# Patient Record
Sex: Male | Born: 1945 | Race: White | Hispanic: No | Marital: Married | Smoking: Former smoker
Health system: Southern US, Community
[De-identification: ages and names within clinical notes are randomized; demographics above are authoritative.]

## PROBLEM LIST (undated history)

## (undated) DIAGNOSIS — E785 Hyperlipidemia, unspecified: Secondary | ICD-10-CM

## (undated) DIAGNOSIS — K219 Gastro-esophageal reflux disease without esophagitis: Secondary | ICD-10-CM

## (undated) DIAGNOSIS — Z9289 Personal history of other medical treatment: Secondary | ICD-10-CM

## (undated) DIAGNOSIS — Z87442 Personal history of urinary calculi: Secondary | ICD-10-CM

## (undated) DIAGNOSIS — I714 Abdominal aortic aneurysm, without rupture: Secondary | ICD-10-CM

## (undated) DIAGNOSIS — M542 Cervicalgia: Secondary | ICD-10-CM

## (undated) DIAGNOSIS — R011 Cardiac murmur, unspecified: Secondary | ICD-10-CM

## (undated) DIAGNOSIS — K222 Esophageal obstruction: Secondary | ICD-10-CM

## (undated) DIAGNOSIS — E039 Hypothyroidism, unspecified: Secondary | ICD-10-CM

## (undated) DIAGNOSIS — E119 Type 2 diabetes mellitus without complications: Secondary | ICD-10-CM

## (undated) DIAGNOSIS — I219 Acute myocardial infarction, unspecified: Secondary | ICD-10-CM

## (undated) DIAGNOSIS — I2510 Atherosclerotic heart disease of native coronary artery without angina pectoris: Secondary | ICD-10-CM

## (undated) DIAGNOSIS — Z86010 Personal history of colonic polyps: Secondary | ICD-10-CM

## (undated) DIAGNOSIS — C679 Malignant neoplasm of bladder, unspecified: Secondary | ICD-10-CM

## (undated) DIAGNOSIS — Z860101 Personal history of adenomatous and serrated colon polyps: Secondary | ICD-10-CM

## (undated) DIAGNOSIS — I10 Essential (primary) hypertension: Secondary | ICD-10-CM

## (undated) HISTORY — PX: COLONOSCOPY: SHX174

## (undated) HISTORY — DX: Abdominal aortic aneurysm, without rupture: I71.4

## (undated) HISTORY — DX: Cardiac murmur, unspecified: R01.1

## (undated) HISTORY — DX: Gastro-esophageal reflux disease without esophagitis: K21.9

## (undated) HISTORY — DX: Cervicalgia: M54.2

## (undated) HISTORY — DX: Hypothyroidism, unspecified: E03.9

## (undated) HISTORY — DX: Personal history of colonic polyps: Z86.010

## (undated) HISTORY — DX: Hyperlipidemia, unspecified: E78.5

## (undated) HISTORY — PX: TONSILLECTOMY AND ADENOIDECTOMY: SUR1326

## (undated) HISTORY — DX: Esophageal obstruction: K22.2

## (undated) HISTORY — DX: Personal history of adenomatous and serrated colon polyps: Z86.0101

## (undated) HISTORY — DX: Atherosclerotic heart disease of native coronary artery without angina pectoris: I25.10

## (undated) HISTORY — DX: Malignant neoplasm of bladder, unspecified: C67.9

## (undated) HISTORY — DX: Essential (primary) hypertension: I10

## (undated) HISTORY — DX: Acute myocardial infarction, unspecified: I21.9

## (undated) HISTORY — DX: Personal history of other medical treatment: Z92.89

---

## 2001-11-13 ENCOUNTER — Emergency Department (HOSPITAL_COMMUNITY): Admission: EM | Admit: 2001-11-13 | Discharge: 2001-11-14 | Payer: Self-pay

## 2004-12-01 ENCOUNTER — Ambulatory Visit: Payer: Self-pay | Admitting: Internal Medicine

## 2004-12-07 ENCOUNTER — Ambulatory Visit: Payer: Self-pay | Admitting: Internal Medicine

## 2004-12-31 DIAGNOSIS — C679 Malignant neoplasm of bladder, unspecified: Secondary | ICD-10-CM

## 2004-12-31 HISTORY — DX: Malignant neoplasm of bladder, unspecified: C67.9

## 2005-01-07 ENCOUNTER — Emergency Department (HOSPITAL_COMMUNITY): Admission: EM | Admit: 2005-01-07 | Discharge: 2005-01-07 | Payer: Self-pay | Admitting: Family Medicine

## 2005-02-02 ENCOUNTER — Ambulatory Visit: Payer: Self-pay | Admitting: Internal Medicine

## 2005-02-27 ENCOUNTER — Ambulatory Visit: Payer: Self-pay | Admitting: Internal Medicine

## 2005-12-31 HISTORY — PX: CORONARY ANGIOPLASTY WITH STENT PLACEMENT: SHX49

## 2006-05-22 ENCOUNTER — Ambulatory Visit: Payer: Self-pay | Admitting: Internal Medicine

## 2006-05-30 ENCOUNTER — Ambulatory Visit: Payer: Self-pay | Admitting: Internal Medicine

## 2006-06-11 ENCOUNTER — Ambulatory Visit: Payer: Self-pay | Admitting: Internal Medicine

## 2006-06-12 ENCOUNTER — Ambulatory Visit: Payer: Self-pay | Admitting: Cardiovascular Disease

## 2006-07-09 ENCOUNTER — Ambulatory Visit (HOSPITAL_BASED_OUTPATIENT_CLINIC_OR_DEPARTMENT_OTHER): Admission: RE | Admit: 2006-07-09 | Discharge: 2006-07-09 | Payer: Self-pay | Admitting: Urology

## 2006-07-09 ENCOUNTER — Encounter (INDEPENDENT_AMBULATORY_CARE_PROVIDER_SITE_OTHER): Payer: Self-pay | Admitting: Specialist

## 2006-07-09 HISTORY — PX: SKIN TAG REMOVAL: SHX780

## 2006-07-09 HISTORY — PX: TRANSURETHRAL RESECTION OF BLADDER: SUR1395

## 2006-12-11 ENCOUNTER — Inpatient Hospital Stay (HOSPITAL_COMMUNITY): Admission: EM | Admit: 2006-12-11 | Discharge: 2006-12-12 | Payer: Self-pay | Admitting: Emergency Medicine

## 2006-12-11 ENCOUNTER — Ambulatory Visit: Payer: Self-pay | Admitting: Cardiology

## 2006-12-12 ENCOUNTER — Ambulatory Visit: Payer: Self-pay | Admitting: Internal Medicine

## 2007-01-08 ENCOUNTER — Ambulatory Visit: Payer: Self-pay | Admitting: Cardiology

## 2007-02-18 ENCOUNTER — Ambulatory Visit: Payer: Self-pay | Admitting: Cardiology

## 2007-03-03 ENCOUNTER — Ambulatory Visit: Payer: Self-pay | Admitting: Cardiology

## 2007-04-17 ENCOUNTER — Ambulatory Visit: Payer: Self-pay | Admitting: Cardiology

## 2007-06-05 ENCOUNTER — Ambulatory Visit: Payer: Self-pay | Admitting: Cardiology

## 2007-08-01 ENCOUNTER — Encounter: Payer: Self-pay | Admitting: Internal Medicine

## 2007-08-01 DIAGNOSIS — K219 Gastro-esophageal reflux disease without esophagitis: Secondary | ICD-10-CM | POA: Insufficient documentation

## 2007-08-01 DIAGNOSIS — E039 Hypothyroidism, unspecified: Secondary | ICD-10-CM | POA: Insufficient documentation

## 2007-09-10 ENCOUNTER — Ambulatory Visit: Payer: Self-pay | Admitting: Cardiology

## 2007-10-30 ENCOUNTER — Ambulatory Visit: Payer: Self-pay | Admitting: Cardiology

## 2007-12-09 ENCOUNTER — Encounter: Payer: Self-pay | Admitting: Internal Medicine

## 2007-12-10 ENCOUNTER — Ambulatory Visit: Payer: Self-pay

## 2007-12-10 ENCOUNTER — Encounter: Payer: Self-pay | Admitting: Internal Medicine

## 2007-12-10 LAB — CONVERTED CEMR LAB
ALT: 41 units/L (ref 0–53)
AST: 31 units/L (ref 0–37)
Albumin: 4.2 g/dL (ref 3.5–5.2)
Alkaline Phosphatase: 91 units/L (ref 39–117)
BUN: 18 mg/dL (ref 6–23)
Bilirubin, Direct: 0.3 mg/dL (ref 0.0–0.3)
CO2: 27 meq/L (ref 19–32)
Calcium: 8.7 mg/dL (ref 8.4–10.5)
Chloride: 104 meq/L (ref 96–112)
Cholesterol: 91 mg/dL (ref 0–200)
Creatinine, Ser: 1.1 mg/dL (ref 0.4–1.5)
GFR calc Af Amer: 88 mL/min
GFR calc non Af Amer: 73 mL/min
Glucose, Bld: 103 mg/dL — ABNORMAL HIGH (ref 70–99)
HDL: 21.4 mg/dL — ABNORMAL LOW (ref >39.0)
LDL Cholesterol: 51 mg/dL (ref 0–99)
Potassium: 4.1 meq/L (ref 3.5–5.1)
Sodium: 139 meq/L (ref 135–145)
TSH: 1.27 microintl units/mL (ref 0.35–5.50)
Total Bilirubin: 2.6 mg/dL — ABNORMAL HIGH (ref 0.3–1.2)
Total CHOL/HDL Ratio: 4.3
Total Protein: 6.6 g/dL (ref 6.0–8.3)
Triglycerides: 93 mg/dL (ref 0–149)
VLDL: 19 mg/dL (ref 0–40)

## 2007-12-19 ENCOUNTER — Ambulatory Visit: Payer: Self-pay | Admitting: Internal Medicine

## 2007-12-19 DIAGNOSIS — M542 Cervicalgia: Secondary | ICD-10-CM | POA: Insufficient documentation

## 2008-03-03 ENCOUNTER — Ambulatory Visit: Payer: Self-pay | Admitting: Cardiology

## 2008-03-03 LAB — CONVERTED CEMR LAB
ALT: 39 units/L (ref 0–53)
AST: 28 units/L (ref 0–37)
Albumin: 4.5 g/dL (ref 3.5–5.2)
Alkaline Phosphatase: 79 units/L (ref 39–117)
Bilirubin, Direct: 0.3 mg/dL (ref 0.0–0.3)
Cholesterol: 123 mg/dL (ref 0–200)
HDL: 27.6 mg/dL — ABNORMAL LOW (ref >39.0)
LDL Cholesterol: 65 mg/dL (ref 0–99)
Total Bilirubin: 2.6 mg/dL — ABNORMAL HIGH (ref 0.3–1.2)
Total CHOL/HDL Ratio: 4.5
Total Protein: 7.1 g/dL (ref 6.0–8.3)
Triglycerides: 150 mg/dL — ABNORMAL HIGH (ref 0–149)
VLDL: 30 mg/dL (ref 0–40)

## 2008-03-31 ENCOUNTER — Ambulatory Visit: Payer: Self-pay | Admitting: Cardiology

## 2008-07-27 ENCOUNTER — Telehealth: Payer: Self-pay | Admitting: Internal Medicine

## 2009-04-07 DIAGNOSIS — E785 Hyperlipidemia, unspecified: Secondary | ICD-10-CM | POA: Insufficient documentation

## 2009-04-12 ENCOUNTER — Encounter: Payer: Self-pay | Admitting: Cardiology

## 2009-04-12 ENCOUNTER — Ambulatory Visit: Payer: Self-pay | Admitting: Cardiology

## 2009-08-03 ENCOUNTER — Encounter: Payer: Self-pay | Admitting: Cardiology

## 2009-09-12 ENCOUNTER — Encounter: Payer: Self-pay | Admitting: Cardiology

## 2009-09-21 ENCOUNTER — Encounter (INDEPENDENT_AMBULATORY_CARE_PROVIDER_SITE_OTHER): Payer: Self-pay | Admitting: *Deleted

## 2009-12-21 DIAGNOSIS — I25110 Atherosclerotic heart disease of native coronary artery with unstable angina pectoris: Secondary | ICD-10-CM | POA: Insufficient documentation

## 2009-12-21 DIAGNOSIS — I2510 Atherosclerotic heart disease of native coronary artery without angina pectoris: Secondary | ICD-10-CM | POA: Insufficient documentation

## 2009-12-28 ENCOUNTER — Ambulatory Visit: Payer: Self-pay | Admitting: Cardiology

## 2010-01-04 ENCOUNTER — Encounter: Payer: Self-pay | Admitting: Cardiology

## 2010-02-22 ENCOUNTER — Encounter (INDEPENDENT_AMBULATORY_CARE_PROVIDER_SITE_OTHER): Payer: Self-pay | Admitting: *Deleted

## 2010-04-10 ENCOUNTER — Encounter: Payer: Self-pay | Admitting: Cardiology

## 2010-04-20 ENCOUNTER — Ambulatory Visit (HOSPITAL_COMMUNITY): Admission: RE | Admit: 2010-04-20 | Discharge: 2010-04-20 | Payer: Self-pay | Admitting: Urology

## 2010-05-03 ENCOUNTER — Encounter: Payer: Self-pay | Admitting: Cardiology

## 2010-08-07 ENCOUNTER — Encounter: Payer: Self-pay | Admitting: Cardiology

## 2010-12-21 ENCOUNTER — Ambulatory Visit: Payer: Self-pay | Admitting: Cardiology

## 2010-12-21 ENCOUNTER — Encounter: Payer: Self-pay | Admitting: Cardiology

## 2010-12-21 DIAGNOSIS — I10 Essential (primary) hypertension: Secondary | ICD-10-CM | POA: Insufficient documentation

## 2010-12-21 DIAGNOSIS — R011 Cardiac murmur, unspecified: Secondary | ICD-10-CM | POA: Insufficient documentation

## 2011-01-30 NOTE — Letter (Signed)
Summary: [facility] Office Visit Note  [facility] Office Visit Note   Imported By: Roderic Ovens 09/05/2010 11:30:51  _____________________________________________________________________  External Attachment:    Type:   Image     Comment:   External Document

## 2011-01-30 NOTE — Letter (Signed)
Summary: Colonoscopy Date Change Letter  [facility]  [address] [place], [place] [address]   Phone: [phone]  Fax: [phone]      February 22, 2010 MRN: [id number]   Frederick Miles [address] [place], [place]  [address]   Dear Frederick Miles,   Previously you were recommended to have a repeat colonoscopy around this time. Your chart was recently reviewed by Dr. Hedwig Morton. Juanda Chance of [facility]. Follow up colonoscopy is now recommended in March 2013. This revised recommendation is based on current, nationally recognized guidelines for colorectal cancer screening and polyp surveillance. These guidelines are endorsed by the American Cancer Society, [employer] on Colorectal Cancer as well as numerous other major medical organizations.  Please understand that our recommendation assumes that you do not have any new symptoms such as bleeding, a change in bowel habits, anemia, or significant abdominal discomfort. If you do have any concerning GI symptoms or want to discuss the guideline recommendations, please call to arrange an office visit at your earliest convenience. Otherwise we will keep you in our reminder system and contact you 1-2 months prior to the date listed above to schedule your next colonoscopy.  Thank you,  Hedwig Morton. Juanda Chance, M.D  [facility] [phone]

## 2011-01-30 NOTE — Assessment & Plan Note (Signed)
Summary: BACK PAIN/MHF   Vital Signs:  Patient Profile:   65 Years Old Male Weight:      198 pounds Temp:     98.4 degrees F oral BP sitting:   132 / 88  (left arm)  Vitals Entered By: Raechel Ache, RN (December 19, 2007 8:05 AM)                 Chief Complaint:  C/o neck pain since working out at gym last Sunday. Advil helps..  History of Present Illness: 65 year old gentleman, who is very active at the gym. His has some posterior neck pain,that is aggravated by flexion, and certain movements.  The pain does seem to be improving.  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  range-of-motion the neck was full.  He did have some mild posterior neck pain with extreme flexion.  There is no real tenderness over the posterior neck region    Impression & Recommendations:  Problem # 1:  NECK PAIN (ICD-723.1)  His updated medication list for this problem includes:    Bayer Aspirin 325 Mg Tabs (Aspirin) .Marland Kitchen... 1 once daily  the neck pain is muscle ligamentous;  was given samples of Arthrotec to take b.i.d.if unimproved will recontact the office  Complete Medication List: 1)  Synthroid 125 Mcg Tabs (Levothyroxine sodium) .Marland Kitchen.. 1 once daily 2)  Prilosec 20 Mg Cpdr (Omeprazole) .Marland Kitchen.. 1 once daily 3)  Plavix 75 Mg Tabs (Clopidogrel bisulfate) .Marland Kitchen.. 1 once daily 4)  Bayer Aspirin 325 Mg Tabs (Aspirin) .Marland Kitchen.. 1 once daily 5)  Lipitor 80 Mg Tabs (Atorvastatin calcium) .Marland Kitchen.. 1 once daily   Patient Instructions: 1)  Please schedule a follow-up appointment as needed.    ]

## 2011-01-30 NOTE — Letter (Signed)
Summary: [facility] Note  [facility] Note   Imported By: Roderic Ovens 08/23/2009 11:49:24  _____________________________________________________________________  External Attachment:    Type:   Image     Comment:   External Document

## 2011-01-30 NOTE — Letter (Signed)
Summary: [facility]   Imported By: Kassie Mends 12/18/2007 13:38:49  _____________________________________________________________________  External Attachment:    Type:   Image     Comment:   [facility]

## 2011-01-30 NOTE — Letter (Signed)
Summary: Appointment - Reminder 2  [facility]     [place], [place]    Phone:   Fax:      September 21, 2009 MRN: [id number]   Frederick Miles [address] [place], [place]  [address]   Dear [name],  Our records indicate that it is time to schedule a follow-up appointment.  Dr. Daleen Squibb recommended that you follow up with [place] in Dec  2010. It is very important that we reach you to schedule this appointment. We look forward to participating in your health care needs. Please contact us at the number listed above at your earliest convenience to schedule your appointment.  If you are unable to make an appointment at this time, give [place] a call so we can update our records.     Sincerely,    Lorne Skeens  [facility] Scheduling Team

## 2011-01-30 NOTE — Letter (Signed)
Summary: [facility] Office Note  [facility] Office Note   Imported By: Roderic Ovens 04/26/2010 16:24:56  _____________________________________________________________________  External Attachment:    Type:   Image     Comment:   External Document

## 2011-01-30 NOTE — Assessment & Plan Note (Signed)
Summary: CAD/ANAS      Allergies Added: NKDA  Visit Type:  rov Primary Provider:  Creola Corn  CC:  no cardiac complaints today.  History of Present Illness: Mr Makarewicz returns for evaluation management of his coronary disease. He is extremely active playing golf and jogging has had no symptoms that he presented with initially several years ago. He has a drug-eluting stent to the LAD with normal left ventricular function. He presented with progressive angina initially.  He denies palpitations, orthopnea, PND or peripheral edema. He has had no dyspnea on exertion.  Current Medications (verified): 1)  Synthroid 125 Mcg Tabs (Levothyroxine Sodium) .Marland Kitchen.. 1 Once Daily Needs Office Visit Before More Refills 2)  Prilosec 20 Mg  Cpdr (Omeprazole) .Marland Kitchen.. 1 Once Daily 3)  Plavix 75 Mg  Tabs (Clopidogrel Bisulfate) .Marland Kitchen.. 1 Once Daily--Needs Office Visit For Additional Refills 4)  Bayer Aspirin 325 Mg  Tabs (Aspirin) .Marland Kitchen.. 1 Once Daily 5)  Lipitor 40 Mg Tabs (Atorvastatin Calcium) .Marland Kitchen.. 1 Tab Once Daily  Allergies (verified): No Known Drug Allergies  Past History:  Past Medical History: Last updated: 12/21/2009 CAD, NATIVE VESSEL (ICD-414.01) HYPERLIPIDEMIA-MIXED (ICD-272.4) NECK PAIN (ICD-723.1) HYPOTHYROIDISM (ICD-244.9) GERD (ICD-530.81)    Past Surgical History: Last updated: 04/07/2009 T&A Colonoscopy-02/27/2005 1.  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  Excision of 1.5 cm skin tag right anterior thigh...07/09/06  Family History: Last updated: 04/07/2009 Notable for father who required open heart surgery at age 55, grandfather who died of an MI at age 3, a half brother who had an MI at age 80.  Social History: Last updated: 04/07/2009 Former Smoker Married Alcohol use-yes Full Time  Risk Factors: Smoking Status: quit (08/01/2007)  Review of Systems       negative other than history of present illness  Vital Signs:  Patient profile:   65 year  old male Height:      71 inches Weight:      203 pounds BMI:     28.42 Pulse rate:   80 / minute Pulse rhythm:   regular BP sitting:   150 / 90  (left arm) Cuff size:   large  Vitals Entered By: Danielle Rankin, CMA (December 28, 2009 2:33 PM)  Physical Exam  General:  Well developed, well nourished, in no acute distress. Head:  normocephalic and atraumatic Eyes:  PERRLA/EOM intact; conjunctiva and lids normal. Mouth:  Teeth, gums and palate normal. Oral mucosa normal. Neck:  Neck supple, no JVD. No masses, thyromegaly or abnormal cervical nodes. Lungs:  Clear bilaterally to auscultation and percussion. Heart:  Non-displaced PMI, chest non-tender; regular rate and rhythm, S1, S2 without murmurs, rubs or gallops. Carotid upstroke normal, no bruit. Normal abdominal aortic size, no bruits. Femorals normal pulses, no bruits. Pedals normal pulses. No edema, no varicosities. Msk:  Back normal, normal gait. Muscle strength and tone normal. Pulses:  pulses normal in all 4 extremities Extremities:  No clubbing or cyanosis. Neurologic:  Alert and oriented x 3. Skin:  Intact without lesions or rashes. Psych:  Normal affect.   Impression & Recommendations:  Problem # 1:  CAD, NATIVE VESSEL (ICD-414.01) Assessment Unchanged He is remarkably stable and very active. Reviewing his history Junious Dresser a classic symptoms for several days prior to coming into the hospital. This consists of extreme fatigue, shortness of breath, and then substernal chest pressure. We reviewed these again at length and how to respond. At this point I think an objective assessment of his coronaries with a stress  test would be a very little value. However and come back in a year. His updated medication list for this problem includes:    Plavix 75 Mg Tabs (Clopidogrel bisulfate) .Marland Kitchen... 1 once daily--needs office visit for additional refills    Bayer Aspirin 325 Mg Tabs (Aspirin) .Marland Kitchen... 1 once daily  Problem # 2:   HYPERLIPIDEMIA-MIXED (ICD-272.4) Assessment: Unchanged his updated medication list for this problem includes:    Lipitor 40 Mg Tabs (Atorvastatin calcium) .Marland Kitchen... 1 tab once daily  Patient Instructions: 1)  Your physician recommends that you schedule a follow-up appointment in: 12 months. The office will mail a reminder 1 to 2 months prior appointment date.

## 2011-01-30 NOTE — Letter (Signed)
Summary: [facility] Office Note  [facility] Office Note   Imported By: Roderic Ovens 01/20/2010 14:41:10  _____________________________________________________________________  External Attachment:    Type:   Image     Comment:   External Document

## 2011-01-30 NOTE — Letter (Signed)
Summary: [facility] Office Note  [facility] Office Note   Imported By: Roderic Ovens 05/25/2010 15:26:01  _____________________________________________________________________  External Attachment:    Type:   Image     Comment:   External Document

## 2011-01-30 NOTE — Assessment & Plan Note (Signed)
Summary: [facility]  Medications Added LIPITOR 40 MG TABS (ATORVASTATIN CALCIUM) 1 tab once daily       21  22  23  24  25  Allergies Added: NKDA  Visit Type:  Follow-up Primary Provider:  Birdie Sons MD  CC:  no complaints.  History of Present Illness: [name] comes in today for followup of his coronary disease. He is remarkably well working out 5 days a week and playing lots of golf. He is having no symptoms of angina or ischemia. He denies any syncope, dyspnea on exertion, palpitations.  He is very compliant with medications. His blood work is being followed by his primary care physician.  Current Medications (verified): 1)  Synthroid 125 Mcg Tabs (Levothyroxine Sodium) .Marland Kitchen.. 1 Once Daily Needs Office Visit Before More Refills 2)  Prilosec 20 Mg  Cpdr (Omeprazole) .Marland Kitchen.. 1 Once Daily 3)  Plavix 75 Mg  Tabs (Clopidogrel Bisulfate) .Marland Kitchen.. 1 Once Daily 4)  Bayer Aspirin 325 Mg  Tabs (Aspirin) .Marland Kitchen.. 1 Once Daily 5)  Lipitor 40 Mg Tabs (Atorvastatin Calcium) .Marland Kitchen.. 1 Tab Once Daily  Allergies (verified): No Known Drug Allergies  Past History:  Past Medical History:    CAD, UNSPECIFIED SITE (ICD-414.00)    HYPERLIPIDEMIA-MIXED (ICD-272.4)    NECK PAIN (ICD-723.1)    HYPOTHYROIDISM (ICD-244.9)    GERD (ICD-530.81)    Kidney Stone     (04/07/2009)  Family History:    Notable for father who required open heart surgery at    age 82, grandfather who died of an MI at age 36, a half brother who had    an MI at age 68.     (04/07/2009)  Review of Systems  The patient denies weight loss, weight gain, vision loss, decreased hearing, chest pain, syncope, dyspnea on exertion, peripheral edema, muscle weakness, and difficulty walking.    Vital Signs:  Patient profile:   65 year old male Height:      71 inches Weight:      197 pounds BMI:     27.58 Pulse rate:   72 / minute Pulse rhythm:   regular BP sitting:   148 / 92  (left arm)  Vitals Entered By: Danielle Rankin,  CMA (April 12, 2009 2:18 PM)  Physical Exam  General:  Well developed, well nourished, in no acute distress. Head:  normocephalic and atraumatic Eyes:  PERRLA/EOM intact; conjunctiva and lids normal. Mouth:  Teeth, gums and palate normal. Oral mucosa normal. Neck:  Neck supple, no JVD. No masses, thyromegaly or abnormal cervical nodes. Chest Caryn Gienger:  no deformities or breast masses noted Lungs:  Clear bilaterally to auscultation and percussion. Heart:  Non-displaced PMI, chest non-tender; regular rate and rhythm, S1, S2 without murmurs, rubs or gallops. Carotid upstroke normal, no bruit. Normal abdominal aortic size, no bruits. Femorals normal pulses, no bruits. Pedals normal pulses. No edema, no varicosities. Abdomen:  Bowel sounds positive; abdomen soft and non-tender without masses, organomegaly, or hernias noted. No hepatosplenomegaly. Msk:  Back normal, normal gait. Muscle strength and tone normal. Pulses:  pulses normal in all 4 extremities Extremities:  No clubbing or cyanosis. Neurologic:  Alert and oriented x 3. Skin:  Intact without lesions or rashes.   Impression & Recommendations:  Problem # 1:  CAD, UNSPECIFIED SITE (ICD-414.00) Assessment Unchanged  His updated medication list for this problem includes:    Plavix 75 Mg Tabs (Clopidogrel bisulfate) .Marland Kitchen... 1 once daily    Bayer Aspirin 325 Mg Tabs (Aspirin) .Marland KitchenMarland KitchenMarland KitchenMarland Kitchen 1  once daily The patient asked about the interaction between Plavix and Prilosec. He's been on both for several years now with no problems. I told today that was inconsistent and at most cardiologists would not change it. He will stay on Plavix as long as he can afford it or until he has a complication. We've had discussion before.  Patient Instructions: 1)  Your physician recommends that you schedule a follow-up appointment in:6 months 2)  Your physician has requested that you have an exercise stress myoview.  For further information please visit  [email or web address]  Please follow instruction sheet, as given.we will schedule this in the fall and seen back in December.

## 2011-01-30 NOTE — Progress Notes (Signed)
Summary: lipitor refill  Phone Note From Pharmacy   [facility]  [address] [phone]* Call For: todd  Details for Reason: refill of lipitor Summary of Call: pharm is calling for refill of lipitor Initial call taken by: Kern Reap CMA,  July 27, 2008 9:05 AM  Follow-up for Phone Call        Pharmacist called Follow-up by: Kern Reap CMA,  July 27, 2008 9:06 AM      Prescriptions: LIPITOR 80 MG  TABS (ATORVASTATIN CALCIUM) 1 once daily  #30 x 0   Entered by:   Kern Reap CMA   Authorized by:   Birdie Sons MD   Signed by:   Kern Reap CMA on 07/27/2008   Method used:   Electronically sent to ...       [facility]  [address] [phone]*       [address]       [place], [place]  [address]       Ph: [phone] or [phone]       Fax: [phone]   RxID:   [id number]

## 2011-01-30 NOTE — Letter (Signed)
Summary: [facility]  [facility]   Imported By: Maryln Gottron 04/17/2010 15:57:59  _____________________________________________________________________  External Attachment:    Type:   Image     Comment:   External Document

## 2011-02-01 NOTE — Assessment & Plan Note (Signed)
Summary: 1 YR/D.MILLER  Medications Added LISINOPRIL 20 MG TABS (LISINOPRIL) 1 tab once daily      Allergies Added: NKDA  Visit Type:  1 yr f/u Primary Provider:  Creola Corn  CC:  pt denies any cardiac complaints today.  History of Present Illness: Frederick Miles comes in today for followup of his coronary disease. He's had no symptoms of angina or ischemia. He remains reactive playing golf and blue grass music.  He is followed now by Dr. Timothy Lasso who checks on his blood work. Meds reviewed and he is very compliant.  Denies any presyncope or sick with exertion. He said no dyspnea on exertion.  Clinical Reports Reviewed:  Cardiac Cath:  12/11/2006: Cardiac Cath Findings:   FINDINGS:  1. LV:  133/0/17.  EF 65% without regional [name] motion abnormality.  2. No aortic stenosis or mitral regurgitation.  3. Left main:  Angiographically normal.  4. LAD:  95% stenosis of the proximal vessel which was stented to no      residual.  There is a single moderate-sized diagonal.  This vessel      has a 50% stenosis in its midsection.  5. Circumflex:  Large vessel giving rise to a single branching obtuse      marginal.  It has only minor luminal irregularities.  6. RCA:  Large, dominant vessel.  It is angiographically normal.   IMPRESSION/PLAN:  Single vessel coronary disease with successful  revascularization of the proximal left anterior descending artery using  a drug-eluting stent.  Since a drug-eluting stent was used, he should be  maintained on both aspirin and Plavix indefinitely.   Salvadore Farber, MD  Electronically Signed   WED/MEDQ  D:  12/11/2006  T:  12/11/2006  Job:  [id number]   cc:   Valetta Mole. Swords, MD   Current Medications (verified): 1)  Synthroid 125 Mcg Tabs (Levothyroxine Sodium) .Marland Kitchen.. 1 Once Daily Needs Office Visit Before More Refills 2)  Prilosec 20 Mg  Cpdr (Omeprazole) .Marland Kitchen.. 1 Once Daily--Needs Office Visit For Additional Refills 3)  Plavix 75 Mg  Tabs  (Clopidogrel Bisulfate) .Marland Kitchen.. 1 Once Daily--Needs Office Visit For Additional Refills 4)  Bayer Aspirin 325 Mg  Tabs (Aspirin) .Marland Kitchen.. 1 Once Daily 5)  Lipitor 40 Mg Tabs (Atorvastatin Calcium) .Marland Kitchen.. 1 Tab Once Daily 6)  Lisinopril 20 Mg Tabs (Lisinopril) .Marland Kitchen.. 1 Tab Once Daily  Allergies (verified): No Known Drug Allergies  Past History:  Past Medical History: Last updated: 12/21/2009 CAD, NATIVE VESSEL (ICD-414.01) HYPERLIPIDEMIA-MIXED (ICD-272.4) NECK PAIN (ICD-723.1) HYPOTHYROIDISM (ICD-244.9) GERD (ICD-530.81)    Past Surgical History: Last updated: 04/07/2009 T&A Colonoscopy-02/27/2005 1.  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  Excision of 1.5 cm skin tag right anterior thigh...07/09/06  Family History: Last updated: 04/07/2009 Notable for father who required open heart surgery at age 10, grandfather who died of an MI at age 76, a half brother who had an MI at age 62.  Social History: Last updated: 04/07/2009 Former Smoker Married Alcohol use-yes Full Time  Risk Factors: Smoking Status: quit (08/01/2007)  Review of Systems       negative other than history of present illness  Vital Signs:  Patient profile:   65 year old male Height:      71 inches Weight:      196.75 pounds BMI:     27.54 Pulse rate:   70 / minute Pulse rhythm:   regular BP sitting:   134 / 86  (left arm) Cuff size:  large  Vitals Entered By: Danielle Rankin, CMA (December 21, 2010 2:23 PM)  Physical Exam  General:  Well developed, well nourished, in no acute distress. Head:  normocephalic and atraumatic Eyes:  PERRLA/EOM intact; conjunctiva and lids normal. Neck:  Neck supple, no JVD. No masses, thyromegaly or abnormal cervical nodes. Chest Sully Manzi:  no deformities or breast masses noted Lungs:  Clear bilaterally to auscultation and percussion. Heart:  PMI nondisplaced, normal S1-S2, soft systolic murmur, S2 splits, no carotid bruits Abdomen:  soft, good bowel  sounds, no midline bruit Msk:  Back normal, normal gait. Muscle strength and tone normal. Pulses:  pulses normal in all 4 extremities Extremities:  No clubbing or cyanosis. Neurologic:  Alert and oriented x 3. Skin:  Intact without lesions or rashes. Psych:  Normal affect.   Problems:  Medical Problems Added: 1)  Dx of Hypertension, Benign  (ICD-401.1) 2)  Dx of Murmur  (ICD-785.2)  Impression & Recommendations:  Problem # 1:  MURMUR (ICD-785.2) This is new it sounds like aortic sclerosis. His catheterization 2007 showed no gradient. He is asymptomatic. We'll follow clinically. His updated medication list for this problem includes:    Lisinopril 20 Mg Tabs (Lisinopril) .Marland Kitchen... 1 tab once daily  Problem # 2:  CAD, NATIVE VESSEL (ICD-414.01) He presented with classic symptoms. We reviewed these again today. His updated medication list for this problem includes:    Plavix 75 Mg Tabs (Clopidogrel bisulfate) .Marland Kitchen... 1 once daily--needs office visit for additional refills    Bayer Aspirin 325 Mg Tabs (Aspirin) .Marland Kitchen... 1 once daily    Lisinopril 20 Mg Tabs (Lisinopril) .Marland Kitchen... 1 tab once daily  Orders: EKG w/ Interpretation (93000)  Problem # 3:  HYPERLIPIDEMIA-MIXED (ICD-272.4)  His updated medication list for this problem includes:    Lipitor 40 Mg Tabs (Atorvastatin calcium) .Marland Kitchen... 1 tab once daily  Problem # 4:  HYPERTENSION, BENIGN (ICD-401.1)  His updated medication list for this problem includes:    Bayer Aspirin 325 Mg Tabs (Aspirin) .Marland Kitchen... 1 once daily    Lisinopril 20 Mg Tabs (Lisinopril) .Marland Kitchen... 1 tab once daily  Patient Instructions: 1)  Your physician recommends that you schedule a follow-up appointment in: 1 year with Dr. Daleen Squibb 2)  Your physician recommends that you continue on your current medications as directed. Please refer to the Current Medication list given to you today.

## 2011-03-20 LAB — COMPREHENSIVE METABOLIC PANEL
ALT: 35 U/L (ref 0–53)
AST: 27 U/L (ref 0–37)
Albumin: 4.4 g/dL (ref 3.5–5.2)
Alkaline Phosphatase: 102 U/L (ref 39–117)
BUN: 18 mg/dL (ref 6–23)
CO2: 27 mEq/L (ref 19–32)
Calcium: 9.5 mg/dL (ref 8.4–10.5)
Chloride: 108 mEq/L (ref 96–112)
Creatinine, Ser: 1.17 mg/dL (ref 0.4–1.5)
GFR calc Af Amer: >60 mL/min (ref >60)
GFR calc non Af Amer: >60 mL/min (ref >60)
Glucose, Bld: 112 mg/dL — ABNORMAL HIGH (ref 70–99)
Potassium: 4.4 mEq/L (ref 3.5–5.1)
Sodium: 142 mEq/L (ref 135–145)
Total Bilirubin: 2.1 mg/dL — ABNORMAL HIGH (ref 0.3–1.2)
Total Protein: 6.9 g/dL (ref 6.0–8.3)

## 2011-03-20 LAB — CBC
HCT: 44.2 % (ref 39.0–52.0)
Hemoglobin: 15.2 g/dL (ref 13.0–17.0)
MCHC: 34.4 g/dL (ref 30.0–36.0)
MCV: 93.3 fL (ref 78.0–100.0)
Platelets: 157 10*3/uL (ref 150–400)
RBC: 4.73 MIL/uL (ref 4.22–5.81)
RDW: 12.9 % (ref 11.5–15.5)
WBC: 5.6 10*3/uL (ref 4.0–10.5)

## 2011-05-15 NOTE — Assessment & Plan Note (Signed)
[facility]                            CARDIOLOGY OFFICE NOTE   Frederick Miles, Frederick Miles                     MRN:          [id number]  DATE:10/30/2007                            DOB:          12-Feb-1946    Mr. Tolen is a delightful 65 year old gentleman, a neighbor of Danise Mina, who comes today to establish with me as his cardiologist.  He  has been a former patient of Dr. Geralynn Rile.   His cardiac story began in December 2007 when he had unstable angina.  His troponin was elevated at 0.9.  He had a high-grade LAD, and had a  drug-eluting Perseus stent to the proximal LAD.  He had no other  significant disease, except for a 50% and a moderate-sized diagonal.   He has normal left ventricular function.   He is not having any angina at present.  Occasionally, he will get a  little short of breath, but he is not sure if that is not psychological.  He is a very avid golfer, and carries his bag.   CURRENT MEDICATIONS:  1. Lipitor 80 mg nightly.  2. Plavix 75 mg a day (Dr. Samule Ohm told him to stay on it forever).  3. Synthroid 125 mcg a day.  4. Prilosec 20 mg a day.  5. Aspirin 325 mg a day.   He says he has had no blood work checked on Lipitor, incidentally.   He denies any orthopnea, PND, peripheral edema, tachy palpitations,  presyncope, or syncope.  He has had no muscle aches or pains.   His blood pressure today is 129/79.  His pulse is 59 and regular.  Weight is 193, up 11.  His EKG shows sinus brady with no acute changes.  HEENT:  Normocephalic and atraumatic.  PERRLA.  Extraocular movements  intact.  Sclerae are slightly injected.  Facial symmetry is normal.  Carotid upstrokes are equal bilaterally without bruits.  No JVD.  Thyroid is not enlarged.  Trachea is midline.  LUNGS:  Clear.  HEART:  Reveals a non-displaced PMI.  Normal S1 and S2.  ABDOMINAL EXAM:  Soft with good bowel sounds.  No midline bruit.  EXTREMITIES:  Without  cyanosis, clubbing, or edema.  Pulses are intact.  NEUROLOGIC EXAM:  Intact.  SKIN:  Unremarkable.   I had a long talk getting to know Mr. Imran.  I have answered multiple  questions about Plavix, and we decided to continue it indefinitely.  In  addition, he clearly needs fasting lipids, LFTs being on the high-dose  Lipitor.  I note that he had a mixed hyperlipidemia, mostly  hypertriglyceridemia, in the distant past.   He also should have a TSH checked being on Synthroid.  He says he has  not had this checked in while either.   We will set him up for an exercise rest/stress Myoview in December.  He  will not need to stop any drugs.   I will plan on seeing him back otherwise in 6 months.     Thomas C. Daleen Squibb, MD, [facility]  Electronically Signed    TCW/MedQ  DD:  10/30/2007  DT: 10/31/2007  Job #: [id number]   cc:   Valetta Mole. Swords, MD

## 2011-05-15 NOTE — Assessment & Plan Note (Signed)
[facility]                            CARDIOLOGY OFFICE NOTE   Frederick Miles, Frederick Miles                     MRN:          [id number]  DATE:06/05/2007                            DOB:          [birth year]    PRIMARY CARE PHYSICIAN:  Dr. Birdie Sons.   HISTORY OF PRESENT ILLNESS:  Frederick Miles is a 64 year old gentleman who  suffered non ST elevation myocardial infarction in December of 2007. I  placed Perseus drug-eluting stent in his proximal LAD. Since then, he  has suffered a metallic taste in his mouth. We stopped all non essential  medicines with the hope sorting this out, including his lisinopril,  Lipitor, and carvedilol. Unfortunately, it did not markedly improve with  stopping these. However, it has slowly improved. He has never lost any  weight and has in fact put on a bit of weight over the past month. He is  no longer so troubled by it.   CURRENT MEDICATIONS:  Therefore;  1. Lipitor 80 mg daily.  2. Plavix 75 mg daily.  3. Synthroid 125 mcg daily.  4. Prilosec 20 mg daily.  5. Enteric coated aspirin 325 mg daily.   PHYSICAL EXAMINATION:  He is generally well-appearing in no distress  with a heart rate of 80, blood pressure 118/78, and weight 182 pounds.  He has no jugular venous distension, thyromegaly, or lymphadenopathy.  LUNGS: Clear to auscultation.  He has a nondisplaced point of maximal cardiac impulse. There is a  regular rate and rhythm without murmur, rub, or gallop.  ABDOMEN: Soft, nondistended, nontender. There is no hepatosplenomegaly.  Bowel sounds are normal. There is no midline pulsatile mass.  EXTREMITIES: Warm without edema.   IMPRESSION/RECOMMENDATIONS:  1. Metallic taste, etiology remains unclear. He is doing very well      from cardiac view point. He is back to playing golf, walking 18      holes 3 or 4 times a week without any discomfort. Continue current      medications.  2. Coronary disease, doing well after a  non ST elevation myocardial      infarction. Enzyme elevation was trivial, so the indication for      beta blockade is soft. Since the taste issue did improve slightly      with stopping some of his medicines, I will not resume the Coreg.  3. Hypercholesterolemia, tolerating the Lipitor well.   Will have patient follow up with Dr. Daleen Squibb in 3 months.     Salvadore Farber, MD  Electronically Signed    WED/MedQ  DD: 06/05/2007  DT: 06/05/2007  Job #: [id number]

## 2011-05-15 NOTE — Assessment & Plan Note (Signed)
[facility]                            CARDIOLOGY OFFICE NOTE   Frederick Miles, Frederick Miles                     MRN:          [id number]  DATE:03/31/2008                            DOB:          07-27-1946    Frederick Miles returns today for further management of the following  issues:  1. Coronary artery disease.  He is status post LAD drug-eluting stent      in December of 2007.  2. Normal left ventricular function.  3. Residual 50% of the moderate size diagonal branch.  4. Mixed hyperlipidemia.   I saw him initially on October 30, 2007 after Dr. Samule Ohm moved to  [place].  We obtained a stress Myoview which showed excellent exercise  tolerance at 13-1/2 minutes.  His EF was 75%, inferior wall was thin,  but no ischemia.  Normal contractility and thickening in all areas of  the myocardium.   On high-dose Lipitor his cholesterol was actually 91 with an LDL of 51.  I decreased it to 40 mg a day.  His last cholesterol panel March 2009  was 123 total, triglycerides 150, HDL 27.6 up from 21 and an LDL 65.   He has no complaints of angina.  He is working out 4x a week.  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   PHYSICAL EXAMINATION:  His blood pressure today is 139/86.  His pulse is  64 and regular.  His weight is 194.  HEENT:  Normocephalic, atraumatic.  PERRL.  Extraocular movements  intact.  Sclerae clear.  Face symmetry normal.  Carotid upstrokes equal bilaterally without bruits, no JVD.  Thyroid is  not enlarged.  Trachea is midline.  LUNGS:  Clear.  HEART:  Reveals a nondisplaced PMI.  Normal S1-S2.  ABDOMINAL EXAM:  Protuberant, good bowel sounds.  No midline bruit or  hepatosplenomegaly.  There is no cyanosis, clubbing, or edema.  Pulses are intact.  NEUROLOGIC:  Exam is intact.   EKG:  Completely normal.   ASSESSMENT/PLAN:  I think that Frederick Miles is doing well.  I asked  him  to continue with his current program.  We will plan on seeing him back  in a year.     Thomas C. Daleen Squibb, MD, [facility]  Electronically Signed   TCW/MedQ  DD: 03/31/2008  DT: 03/31/2008  Job #: [id number]

## 2011-05-18 NOTE — Assessment & Plan Note (Signed)
[facility]                                 ON-CALL NOTE   NEIMAN, ROOTS                       MRN:          [id number]  DATE:02/25/2007                            DOB:          Sep 13, 1946    Call at 5:26 p.m.  The caller is Virgil Lightner, his wife.  Phone number  is [phone].  Dr. Cato Mulligan is their regular doctor.   CHIEF COMPLAINT:  Flu symptoms.   The patient's wife says he just came home with some flu symptoms that  started the later part of today.  He is complaining of being achy all  over and having some chills.  He is very tired, denies runny nose,  cough, sore throat or other symptoms, denies nausea, vomiting or  diarrhea.  She is about to take his temperature now, and suspects he has  a fever.  She wants to know if we can call in something.  I told her  that I could not call in anything over the phone, but that if his  condition worsens to just take him to the emergency room or urgent care  tonight, to go ahead and check his temperature and give him Tylenol very  4 to 6 hours as directed for fever.  This may help symptoms some.  If  she does not take him to urgent care or emergency room tonight they will  call the office first thing in the morning to get an appointment.     Marne A. Tower, MD  Electronically Signed    MAT/MedQ  DD: 02/25/2007  DT: 02/26/2007  Job #: [id number]   cc:   Valetta Mole. Swords, MD

## 2011-05-18 NOTE — Assessment & Plan Note (Signed)
[facility]                            CARDIOLOGY OFFICE NOTE   BRETT, DARKO                     MRN:          [id number]  DATE:03/03/2007                            DOB:          1946/07/06    HISTORY OF PRESENT ILLNESS:  Mr. Broce is a 65 year old gentleman who  has suffered non ST elevation myocardial infarction on December 11, 2006.  I placed a Perseus drug-eluting stent in his paroxysmal LAD.  He  has done nicely since then.  He has, however, suffered from a metallic  taste in his mouth.  He was seen in the office on February 19 for this.  At that time, he had stopped all of his medications.  He was seen by  Herma Carson and encouraged to resume his medications, especially the  aspirin and Plavix.  He has done so.  He and his wife have called have  called over the past couple of days.  I spoke with them Friday.  Both of  them were extremely angry, particularly his wife.  She said that he had  lost 25 pounds due to the metallic taste.  He is not having any trouble  swallowing and no other sensory deficits or changes.   He is currently taking all of his medications including, aspirin 325 mg  p.o. daily, Prilosec 20 mg daily, Synthroid 125 mcg daily, carvedilol  3.125 mg twice per day, Plavix 75 mg daily, Lipitor 80 mg daily and  Lisinopril 5 mg daily.   PHYSICAL EXAMINATION:  He is generally well appearing, in no distress.  Heart rate 60, blood pressure 130/90 and weight of 178 pounds.  Weight  is down 12 pounds from January.  Baseline premorbid weight was 190  pounds.  He has no jugular venous distension, thyromegaly or lymphadenopathy.  LUNGS:  Clear to auscultation.  He has a  non-displaced point of maximal cardiac impulse.  ABDOMEN:  Soft, nondistended, nontender.  There is no  hepatosplenomegaly.  Bowel sounds are normal.  EXTREMITIES:  Warm without edema.   IMPRESSION/RECOMMENDATION:  1. Coronary disease:  Doing well  after a small non ST elevation      myocardial infarction.  Ejection fraction is preserved.  He has a      drug-eluting stent in the paroxysmal LAD.  I emphasize the critical      importance of continuing the aspirin and Plavix.  2. Metallic taste:  Etiology is unclear, but the onset clearly does      coincide with this acute coronary syndrome and the addition of      multiple medications.  ACE inhibitors are reported to do this in as      many as 3% of patients.  I can find only a single online complaint      of Plavix doing so.  Nonetheless, the patient tells me he has been      told by his neighbor, Dr. Shan Levans, and as well as his      primary care physician, Dr. Valetta Mole. Swords, that the Plavix is      definitely  a culprit.  I have not had this experience in the      patients I have treated with Plavix.  Nonetheless, I emphasized to      Mr. Clauson we will keep an open mind.  Our alternative would be to      switch him to Ticlid.  I prefer to see if it is one of the      medications first.  We will therefore stop all medications, except      the aspirin, Plavix, and Synthroid.  We will then bring him back in      a month to assess response.  If it improves we will add back      medicines one at a time, with continued concern about the ACE      inhibitor.  If there is no improvement, can consider a switch to      ticlopidine.  Given the risk of neutropenia and thrombotic      cytopenic      proper with long term treatment with ticlopidine, I am reluctant to      switch to it if we can avoid it.     Salvadore Farber, MD  Electronically Signed    WED/MedQ  DD: 03/03/2007  DT: 03/03/2007  Job #: [id number]

## 2011-05-18 NOTE — H&P (Signed)
Frederick Miles, Frederick Miles NO.:  [id number]   MEDICAL RECORD NO.:  [id number]          PATIENT TYPE:  INP   LOCATION:  1823                         FACILITY:  [facility]   PHYSICIAN:  Reginia Forts, MD     DATE OF BIRTH:  1946-08-20   DATE OF ADMISSION:  12/11/2006  DATE OF DISCHARGE:                              HISTORY & PHYSICAL   PRIMARY CARE PHYSICIAN:  Dr. Birdie Sons.   CARDIOLOGIST:  Dr. Dietrich Pates with [facility].   CHIEF COMPLAINT:  Chest pain.   HISTORY OF PRESENT ILLNESS:  Frederick Miles is a 65 year old Caucasian male  with no prior cardiac history, who presents with 2 days of stuttering  exertional chest pressure.  Each episode is described as 6-8/10  substernal chest pressure associated with dyspnea on exertion and  diaphoresis.  Each episode has a duration of approximately 5-10 and is  relieved with rest.  The latest episode occurred at approximately 11:30  p.m. and was tense; subsequently, EMS was contacted.  Upon their  arrival, telemetry noted dynamic T wave inversions in the inferior leads  during active chest pain.  This chest pain subsequently resolved en  route to the emergency room.  In the ED, the patient received heparin  and nitroglycerin, having already taken aspirin at home.  He is  currently chest-pain-free.  He denies any orthopnea or paroxysmal  nocturnal dyspnea, or syncope.   PAST MEDICAL HISTORY:  1. Hypothyroidism.  2. History of bladder tumor, status post transurethral resection in      2007.  3. Nephrolithiasis.  4. GERD.   ALLERGIES:  No known drug allergies.   MEDICATIONS:  1. Synthroid 125 mcg a day.  2. Aspirin 81 mg a day.  3. Prilosec.   SOCIAL HISTORY:  The patient lives in [place] with his wife.  He quit  tobacco use 20 years ago and he used to smoke 1 pack per week.  He does  use occasional wine, up to 2 glasses per week.  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   FAMILY  HISTORY:  Notable for father who required open heart surgery at  age 67, grandfather who died of an MI at age 23, a half brother who had  an MI at age 3.   REVIEW OF SYSTEMS:  Notable for occasional headache.  The rest of the 12-  point review of systems was reviewed and is negative.   PHYSICAL EXAM:  VITAL SIGNS:  Temperature is 97.9, pulse 57, respiratory  rate of 16 and blood pressure 119/78, saturating 98% on 2 L.  GENERAL:  The patient is awake, alert and oriented x3, in no acute  distress.  HEENT:  Normocephalic, atraumatic.  Pupils are equal, round and reactive  to light.  Extraocular movements are intact.  NECK:  No JVD.  No carotid bruits.  CARDIOVASCULAR:  Regular rhythm with normal rate.  No murmurs, rubs, or  gallops.  LUNGS:  Clear to auscultation bilaterally.  ABDOMEN:  Positive bowel sounds.  Soft, nontender and non-distended.  EXTREMITIES:  No cyanosis, clubbing or edema.  Distal pulses 2+ and 2+  femoral pulses with no bruits.  PSYCHIATRIC:  Normal affect.  MUSCULOSKELETAL:  No joint deformities or effusions.  NEUROLOGIC:  Cranial nerves II-XII are grossly intact.  No focal  musculoskeletal or sensory deficits.   LABORATORY AND ACCESSORY CLINICAL DATA:  Chest x-ray demonstrates mild  left basilar atelectasis.   EKG demonstrates heart rate of 57, normal sinus rhythm with resolved T  wave inversions in the inferior leads compared to the EMS tracings.  No  Q waves are noted.   Labs demonstrate a white count of 6.9, hemoglobin of 14.3, platelet  count of 180,000; BUN of 13, creatinine of 1.0; troponin of 0.18 and  0.17, CK of 77 and MB of 2.4.   ASSESSMENT AND PLAN:  This is a 65 year old Caucasian male with symptoms  concerning for acute coronary syndrome.  The patient may have had a  recent non-ST-segment elevation myocardial infarction in the last 24-48  hours, although this is uncertain.   Acute coronary syndrome:  The patient is currently chest-pain-free.   We  will continue heparin and low-dose nitroglycerin and initiate Plavix  therapy along with beta blocker, Lipitor and lisinopril.  The patient  has been advised towards heart catheterization today and is amenable to  the procedure.      Reginia Forts, MD  Electronically Signed     RA/MEDQ  D:  12/11/2006  T:  12/11/2006  Job:  [id number]

## 2011-05-18 NOTE — Assessment & Plan Note (Signed)
[facility]                            CARDIOLOGY OFFICE NOTE   Frederick Miles, Frederick Miles                     MRN:          [id number]  DATE:01/08/2007                            DOB:          July 12, 1946    PRIMARY CARE PHYSICIAN:  Birdie Sons, M.D.   HISTORY OF PRESENT ILLNESS:  Frederick Miles is a 65 year old gentleman who  suffered non-ST-elevation myocardial infarction on December 11, 2006.  I  placed a Perseus drug-eluting stent in his proximal LAD.  He has done  nicely since then.  He has had no recurrent chest discomfort and  dyspnea.  His ejection fraction is normal.  Peak troponin was only 0.19.   CURRENT MEDICATIONS:  1. Lisinopril 5 mg per day.  2. Synthroid 125 mcg per day.  3. Plavix 75 mg per day.  4. Prilosec 20 mg per day.  5. Lipitor 80 mg per day.  6. Carvedilol 3.125 mg twice per day.   He has discontinued the aspirin, thinking he did not need it in addition  to the Plavix.   PHYSICAL EXAMINATION:  He is very well-appearing in no distress with  heart rate 59, blood pressure 113/78, and weight of 190 pounds.  He has  no jugular venous distention, thyromegaly, or lymphadenopathy.  Lungs  are clear to auscultation.  Respiratory effort is normal.  He has a  nondisplaced point of maximal cardiac impulse.  There is a regular rate  and rhythm without murmur, rub, or gallop.  The abdomen is soft,  nondistended, nontender.  There is no hepatosplenomegaly.  Bowel sounds  are normal.  The extremities are warm without clubbing, cyanosis, edema  or ulceration.  Carotid pulses 2+ bilaterally without bruit.   Electrocardiogram demonstrates sinus bradycardia and is otherwise  normal.   IMPRESSION/RECOMMENDATION:  1. Status post small non-ST-elevation myocardial infarction, ejection      fraction normal.  Continue aspirin, ACE inhibitor.  Increase Coreg      to 6.25 mg twice per day.  I discussed with him the critical      importance of  continuing both aspirin and Plavix indefinitely.  2. Hypercholesterolemia:  Continue Lipitor.   DISPOSITION:  I will plan on seeing him back in 3 months' time.    Salvadore Farber, MD  Electronically Signed   WED/MedQ  DD: 01/08/2007  DT: 01/08/2007  Job #: [id number]   cc:   Valetta Mole. Swords, MD

## 2011-05-18 NOTE — Assessment & Plan Note (Signed)
[facility]                            CARDIOLOGY OFFICE NOTE   HAPPY, [place]                     MRN:          [id number]  DATE:04/17/2007                            DOB:          03-Aug-1946    PRIMARY CARE PHYSICIAN:  Dr. Birdie Sons.   HISTORY OF PRESENT ILLNESS:  Frederick Miles is a 65 year old gentleman who  suffered non ST elevation myocardial infarction in December 2007. I  placed a Perseus drug-eluting stent in his proximal LAD. Since then, he  has suffered a metallic taste in his mouth. He has not had any trouble  swallowing and has had no sensory deficits or changes. When I saw him in  early March, we attempted to discontinue all nonessential medicines with  the hope of sorting this out. We stopped his lisinopril, Lipitor and  carvedilol. We continued his aspirin, Plavix, Synthroid and Prilosec. He  had been on the Synthroid and Prilosec prior to the development of this  symptom and prior to the onset of his myocardial infarction.   CURRENT MEDICATIONS:  1. Aspirin 325 mg daily.  2. Plavix 75 mg daily.  3. Synthroid 125 mcg daily.  4. Prilosec 20 mg daily.   PHYSICAL EXAMINATION:  GENERAL:  He is generally well-appearing in no  distress.  VITAL SIGNS:  Heart rate 60, blood pressure 148/88 and weight of 180  pounds.  NECK:  He has jugular venous distention, thyromegaly or lymphadenopathy.  LUNGS:  Clear to auscultation.  CARDIAC:  He has a nondisplaced point of maximal cardiac impulse. There  is a regular rate and rhythm without murmurs, rubs or gallops.  ABDOMEN:  Soft, nondistended, nontender. There is no hepatosplenomegaly.  Bowel sounds are normal.  EXTREMITIES:  Warm and without edema.   IMPRESSION/RECOMMENDATIONS:  1. Metallic taste:  ACE inhibitors are reported to do this in as many      3% of patients. However, it is not markedly improved with cessation      of the lisinopril or Coreg. I can find only a single  online      complaint of Plavix doing this. We have discussed switching to      Ticlid. We discussed the risks associated with Ticlid. He feels      that those risks outweigh the unpleasantness of the abnormal taste.      Since he is not losing weight, I think this is reasonable. Will      plan on continuing the Plavix at least for another 2 months which      will then be more than 6 months after his stent implantation. Would      favor extending it indefinitely, if possible. Will need to continue      to weigh the benefit in reducing late stent thrombosis versus the      risk. Will add back medicines in a stepwise fashion, starting with      Lipitor.  2. Coronary disease:  Doing well after non ST elevation myocardial      infarction. Ejection fraction normal. He has a drug-eluting stent  in the proximal left anterior descending.      Continue aspirin and Plavix. Will plan on resuming carvedilol when      I see him in 2 months.  3. Hypercholesterolemia:  Resume Lipitor now.     Salvadore Farber, MD  Electronically Signed    WED/MedQ  DD: 04/17/2007  DT: 04/18/2007  Job #: [id number]   cc:   Valetta Mole. Swords, MD

## 2011-05-18 NOTE — Cardiovascular Report (Signed)
NAMEJACOBUS, COLVIN NO.:  [id number]   MEDICAL RECORD NO.:  [id number]          PATIENT TYPE:  INP   LOCATION:  2919                         FACILITY:  [facility]   PHYSICIAN:  Salvadore Farber, MD  DATE OF BIRTH:  [birth year]   DATE OF PROCEDURE:  12/11/2006  DATE OF DISCHARGE:                            CARDIAC CATHETERIZATION   PROCEDURE:  Left heart catheterization, left ventriculography, coronary  angiography, placement of drug-eluting stent in the proximal LAD,  Starclose closure of the right common femoral arteriotomy site.   INDICATIONS:  Frederick Miles is a 65 year old gentleman without prior  history of cardiovascular disease.  Only risk factor is family history  and gender.  He presents with one week of angina initially with exertion  only but subsequently at rest.  Electrocardiogram was normal but  troponin was elevated 0.19.  He was treated with aspirin, heparin and  600 mg Plavix load.  He had no recurrent symptoms.  He is then brought  for diagnostic angiography and possible percutaneous coronary  revascularization.   PROCEDURAL TECHNIQUE:  Informed consent was obtained.  Under 1%  lidocaine local anesthesia, a 6-French sheath was placed in the right  common femoral artery using the modified Seldinger technique.  Diagnostic angiography and ventriculography were performed using JL-4,  JR-4, and pigtail catheters.  These images demonstrated the culprit  lesion to be 95% stenosis of the proximal LAD.  Decision was made to  proceed to percutaneous revascularization.  The patient had consented to  participate in the PERSEUS study comparing two drug-eluting stents.   Anticoagulation was initiated with bivalirudin.  The patient had been  preloaded on 600 mg of Plavix more than 6 hours prior to the procedure.  A 6-French CLS 4.0 guide was advanced over wire and engaged in the  ostium of the left main.  Angiography was performed after the  administration of  intracoronary nitroglycerin.  ACT was confirmed to be  greater than 225 seconds.  Blush angiograms were performed.  A Prowater  wire was advanced without difficulty across the lesion into the distal  LAD.  The lesion was predilated using a 2.5 x 9 mm Maverick at 6  atmospheres.  I then attempted to position a 3.0 x 12 mm Taxus element  study stent across the lesion.  There was substantial motion of the  stent to and fro across the lesion which potentially jeopardized a  septal perforator and a large diagonal should be these the jailed  inadvertently.  To hold this motion, I therefore gave 12 mg of  intravenous adenosine producing transient complete heart block during  which the stent was deployed.  Duration of heart block was under 10  seconds.  The stent was then postdilated using a 3.25 x 12 mm Quantum  balloon at 16 atmospheres.  Repeat angiography was performed after the  administration of 200 mcg of nitroglycerin.  Final angiography  demonstrated no residual stenosis, no dissection and TIMI III flow to  the distal vasculature.   The arteriotomy was then closed using a Starclose device.  Complete  hemostasis was obtained.  The  patient was then transferred to the  holding room in stable condition having tolerated the procedure well.   COMPLICATIONS:  None.   FINDINGS:  1. LV:  133/0/17.  EF 65% without regional wall motion abnormality.  2. No aortic stenosis or mitral regurgitation.  3. Left main:  Angiographically normal.  4. LAD:  95% stenosis of the proximal vessel which was stented to no      residual.  There is a single moderate-sized diagonal.  This vessel      has a 50% stenosis in its midsection.  5. Circumflex:  Large vessel giving rise to a single branching obtuse      marginal.  It has only minor luminal irregularities.  6. RCA:  Large, dominant vessel.  It is angiographically normal.   IMPRESSION/PLAN:  Single vessel coronary disease with successful   revascularization of the proximal left anterior descending artery using  a drug-eluting stent.  Since a drug-eluting stent was used, he should be  maintained on both aspirin and Plavix indefinitely.      Salvadore Farber, MD  Electronically Signed     WED/MEDQ  D:  12/11/2006  T:  12/11/2006  Job:  [id number]   cc:   Valetta Mole. Swords, MD

## 2011-05-18 NOTE — Op Note (Signed)
NAMECORDAY, WYKA              ACCOUNT NO.:  [id number]   MEDICAL RECORD NO.:  [id number]          PATIENT TYPE:  AMB   LOCATION:  NESC                         FACILITY:  [facility]   PHYSICIAN:  Excell Seltzer. Annabell Howells, M.D.    DATE OF BIRTH:  [birth year]   DATE OF PROCEDURE:  07/09/2006  DATE OF DISCHARGE:                                 OPERATIVE REPORT   PROCEDURE:  1.  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  2.  Excision of 1.5 cm skin tag right anterior thigh.   SURGEON:  Dr. Bjorn Pippin.   ANESTHESIA:  General.   SPECIMEN:  1.  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  2.  Skin tag from right thigh.   BLOOD LOSS:  Minimal.   DRAIN:  18-French Foley catheter.   COMPLICATIONS:  None.   INDICATIONS:  Mr. 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  He  also reported a symptomatic skin tag on the anterior right thigh that he  desired removed.   FINDINGS AND PROCEDURE:  The patient was taken to the operating room after  receiving Cipro.  A general anesthetic was induced.  He was placed in  lithotomy position.  His perineum and genitalia were prepped with Betadine  solution.  He was draped in the usual sterile fashion.  Cystoscopy was  performed using a 22-French scope, 12 and 70 degrees lenses.  Examination  revealed a normal urethra.  The external sphincter was intact.  The  prostatic urethra was short without obstruction.  Examination of bladder  revealed mild trabeculation.  The low right ureteral orifice was  unremarkable.  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   After completion of initial cystoscopy, a 28-French continuous flow  resectoscope sheath was inserted.  This was fitted with an [place] handle,  a 12 degrees lens and 26 loop.  The cautery was set on the bladder tumor  setting.  The tumor was then  resected with great care being taken to avoid  injury to the ureteral orifice.  Once the tumor had been entirely resected,  the surrounding mucosa was fulgurated and hemostasis was achieved.  The  bladder was evacuated free of tumor material.  Final inspection revealed no  bleeding from the resection site.   An 18-French Foley catheter was placed, the balloon was filled with 10 mL  sterile fluid and placed to straight drainage.   I then exposed the right anterior thigh were the 1.5 cm skin tag was  located.  This was excised using a knife and a Bugbee electrode.  The skin  edges were reapproximated using Dermabond.   The patient was then taken down from the lithotomy position.  His anesthetic  was reversed.  He was moved to the recovery room in stable condition.  There  were no complications.      Excell Seltzer. Annabell Howells, M.D.  Electronically Signed     JJW/MEDQ  D:  07/09/2006  T:  07/09/2006  Job:  [id number]   cc:   Martina Sinner, MD  Fax: [phone]   Valetta Mole. Swords, M.D. [facility]  [address] [place]  [place] [address]

## 2011-05-18 NOTE — Discharge Summary (Signed)
Frederick Miles, Frederick Miles NO.:  [id number]   MEDICAL RECORD NO.:  [id number]          PATIENT TYPE:  INP   LOCATION:  2919                         FACILITY:  [facility]   PHYSICIAN:  Sandford Craze, NP DATE OF BIRTH:  07/06/1946   DATE OF ADMISSION:  12/11/2006  DATE OF DISCHARGE:  12/12/2006                               DISCHARGE SUMMARY   DISCHARGE DIAGNOSES:  1. 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  2. Dyslipidemia.   HISTORY OF PRESENT ILLNESS:  [name] is a 65 year old male, who was  admitted on December 11, 2006, with a chief complaint of chest pain.  He  was known to have positive cardiac enzymes, and a Cardiology consult was  requested.  He was admitted for further evaluation and treatment.   PAST MEDICAL HISTORY:  1. Hypothyroidism.  2. History of bladder tumor with transurethral resection.  3. History of GERD.   COURSE OF HOSPITALIZATION:  Non ST elevation MI status post drug-eluting  stent to LAD.  [name] was seen in consultation by Dr. Randa Evens and underwent a cardiac catheterization on December 11, 2006.  His post cath course has been stable.  There was some minimal drainage  from his cath site, and he required a subcutaneous injection of  lidocaine with epinephrine.  No further oozing has been noted since that  time.  His vitals are stable.  He will be sent out on a low-dose beta  blocker as well as an ACE inhibitor and an increased dose of Lipitor.  He is to be continued on aspirin 325 mg daily.   MEDICATIONS:  At time of discharge:  1. Aspirin 325 mg p.o. daily.  2. Lisinopril 5 mg p.o. daily.  3. Synthroid 125 mcg p.o. daily.  4. Plavix 75 mg p.o. daily.  5. Prilosec once daily as before.  6. Lipitor 80 mg p.o. daily.  7. Coreg 3.125 mg p.o. b.i.d.   PERTINENT LABORATORIES:  At discharge:  Troponin 0.21, BUN 6, creatinine  0.9.  TSH is pending.   DISPOSITION:  The patient  will be discharged to home.  He is to followup  with Dr. Birdie Sons in 2 weeks and contact his office for an  appointment.  He is also to followup with Dr. Randa Evens as  instructed per Cardiology.      Sandford Craze, NP     MO/MEDQ  D:  12/12/2006  T:  12/12/2006  Job:  [id number]   cc:   Valetta Mole. Swords, MD

## 2011-05-18 NOTE — Assessment & Plan Note (Signed)
[facility]                            CARDIOLOGY OFFICE NOTE   Frederick Miles, Frederick Miles                     MRN:          [id number]  DATE:02/18/2007                            DOB:          1946-09-24    Patient came in today complaining of a metallic taste in his mouth, and  had stopped all his medications to see if this would improve it.  He had  called in, and we told him that he needs to take his Plavix and his  Synthroid, but he stopped the rest of them.  This has not changed his  taste buds, and he continues with the metallic taste.   CURRENT MEDICATIONS:  1. Lisinopril 5 mg daily.  2. Lipitor 80 mg daily.  3. Plavix 75 mg daily.  4. Carvedilol 3.125 mg daily, should be b.i.d.  5. Synthroid 125 mcg daily.  6. Prilosec 20 mg daily.   The only thing he is actually taking right now is the Plavix and the  Synthroid.   Blood pressure 120/80, pulse 72, weight 186.   IMPRESSION:  1. Metallic taste in his mouth, question etiology.  2. Status post small non-ST elevation myocardial infarction in      December 2007, treated with Perseus drug-eluting stent in his      proximal left anterior descending.  3. Hyperlipidemia.   PLAN:  After speaking to Shelby Dubin, and talking to the patient  extensively, we did not feel any of the medications he is taking are  causing the metallic taste in his mouth, especially since he stopped the  majority of them, and it has not improved.  He has not had any recent  dental work or dental exam.  He gets his x-rays every 6 months, and has  not had any problems.  He actually was told by a pharmacy friend to take  zinc, but this did not help.  We told him not to take zinc, that this  could cause it.  Told him not to take any multivitamins, which he is not  doing, and hope  that this will resolve itself.  He already has an appointment to see Dr.  Samule Ohm within the next month, and he will see Dr. Cato Mulligan if it  continues.  I told him he could resume his medications.      Jacolyn Reedy, PA-C  Electronically Signed      Madolyn Frieze. Jens Som, MD, [facility]  Electronically Signed   ML/MedQ  DD: 02/18/2007  DT: 02/18/2007  Job #: [id number]

## 2011-12-12 ENCOUNTER — Encounter: Payer: Self-pay | Admitting: *Deleted

## 2011-12-13 ENCOUNTER — Encounter: Payer: Self-pay | Admitting: Cardiology

## 2011-12-13 ENCOUNTER — Ambulatory Visit (INDEPENDENT_AMBULATORY_CARE_PROVIDER_SITE_OTHER): Payer: BC Managed Care – PPO | Admitting: Cardiology

## 2011-12-13 VITALS — BP 126/88 | HR 65 | Ht 70.5 in | Wt 199.0 lb

## 2011-12-13 DIAGNOSIS — I10 Essential (primary) hypertension: Secondary | ICD-10-CM

## 2011-12-13 DIAGNOSIS — I2510 Atherosclerotic heart disease of native coronary artery without angina pectoris: Secondary | ICD-10-CM

## 2011-12-13 DIAGNOSIS — E785 Hyperlipidemia, unspecified: Secondary | ICD-10-CM

## 2011-12-13 MED ORDER — NITROGLYCERIN 0.4 MG SL SUBL: 0.4000 mg | SUBLINGUAL_TABLET | SUBLINGUAL | Status: DC | PRN

## 2011-12-13 NOTE — Patient Instructions (Addendum)
Your physician wants you to follow-up in: 1 year with Dr. Daleen Squibb. You will receive a reminder letter in the mail two months in advance. If you don't receive a letter, please call our office to schedule the follow-up appointment.  Your physician recommends that you continue on your current medications as directed. Please refer to the Current Medication list given to you today. Nitroglycerin sublingual tablets What is this medicine? NITROGLYCERIN (nye troe GLI ser in) is a type of vasodilator. It relaxes blood vessels, increasing the blood and oxygen supply to your heart. This medicine is used to relieve chest pain caused by angina. It is also used to prevent chest pain before activities like climbing stairs, going outdoors in cold weather, or sexual activity. This medicine may be used for other purposes; ask your health care provider or pharmacist if you have questions. What should I tell my health care provider before I take this medicine? They need to know if you have any of these conditions: -anemia -head injury, recent stroke, or bleeding in the brain -liver disease -previous heart attack -an unusual or allergic reaction to nitroglycerin, other medicines, foods, dyes, or preservatives -pregnant or trying to get pregnant -breast-feeding How should I use this medicine? Take this medicine by mouth as needed. At the first sign of an angina attack (chest pain or tightness) place one tablet under your tongue. You can also take this medicine 5 to 10 minutes before an event likely to produce chest pain. Follow the directions on the prescription label. Let the tablet dissolve under the tongue. Do not swallow whole. Replace the dose if you accidentally swallow it. It will help if your mouth is not dry. Saliva around the tablet will help it to dissolve more quickly. Do not eat or drink, smoke or chew tobacco while a tablet is dissolving. If you are not better within 5 minutes after taking ONE dose of  nitroglycerin, call 9-1-1 immediately to seek emergency medical care. Do not take more than 3 nitroglycerin tablets over 15 minutes. If you take this medicine often to relieve symptoms of angina, your doctor or health care professional may provide you with different instructions to manage your symptoms. If symptoms do not go away after following these instructions, it is important to call 9-1-1 immediately. Do not take more than 3 nitroglycerin tablets over 15 minutes. Talk to your pediatrician regarding the use of this medicine in children. Special care may be needed. Overdosage: If you think you have taken too much of this medicine contact a poison control center or emergency room at once. NOTE: This medicine is only for you. Do not share this medicine with others. What if I miss a dose? This does not apply. This medicine is only used as needed. What may interact with this medicine? Do not take this medicine with any of the following medications: -certain migraine medicines like ergotamine and dihydroergotamine (DHE) -medicines used to treat erectile dysfunction like sildenafil, tadalafil, and vardenafil This medicine may also interact with the following medications: -alteplase -aspirin -heparin -medicines for high blood pressure -medicines for mental depression -other medicines used to treat angina -phenothiazines like chlorpromazine, mesoridazine, prochlorperazine, thioridazine This list may not describe all possible interactions. Give your health care provider a list of all the medicines, herbs, non-prescription drugs, or dietary supplements you use. Also tell them if you smoke, drink alcohol, or use illegal drugs. Some items may interact with your medicine. What should I watch for while using this medicine? Tell your doctor  or health care professional if you feel your medicine is no longer working. Keep this medicine with you at all times. Sit or lie down when you take your medicine to  prevent falling if you feel dizzy or faint after using it. Try to remain calm. This will help you to feel better faster. If you feel dizzy, take several deep breaths and lie down with your feet propped up, or bend forward with your head resting between your knees. You may get drowsy or dizzy. Do not drive, use machinery, or do anything that needs mental alertness until you know how this drug affects you. Do not stand or sit up quickly, especially if you are an older patient. This reduces the risk of dizzy or fainting spells. Alcohol can make you more drowsy and dizzy. Avoid alcoholic drinks. Do not treat yourself for coughs, colds, or pain while you are taking this medicine without asking your doctor or health care professional for advice. Some ingredients may increase your blood pressure. What side effects may I notice from receiving this medicine? Side effects that you should report to your doctor or health care professional as soon as possible: -blurred vision -dry mouth -skin rash -sweating -the feeling of extreme pressure in the head -unusually weak or tired Side effects that usually do not require medical attention (report to your doctor or health care professional if they continue or are bothersome): -flushing of the face or neck -headache -irregular heartbeat, palpitations -nausea, vomiting This list may not describe all possible side effects. Call your doctor for medical advice about side effects. You may report side effects to FDA at 1-800-FDA-1088. Where should I keep my medicine? Keep out of the reach of children. Store at room temperature between 20 and 25 degrees C (68 and 77 degrees F). Store in Retail buyer. Protect from light and moisture. Keep tightly closed. Throw away any unused medicine after the expiration date. NOTE: This sheet is a summary. It may not cover all possible information. If you have questions about this medicine, talk to your doctor, pharmacist, or health  care provider.  2012, Elsevier/Gold Standard. (07/09/2008 5:16:24 PM)

## 2011-12-13 NOTE — Progress Notes (Signed)
HPI Frederick Miles comes in today for the evaluation and management of his coronary disease. He is doing remarkably well with no symptoms of angina or ischemia.  He is playing a lot of golf. He is very compliant with his meds. Blood work is being checked by his primary care and was perfect per him.  Past Medical History  Diagnosis Date  . Coronary atherosclerosis of native coronary artery   . Other and unspecified hyperlipidemia   . Cervicalgia   . Unspecified hypothyroidism   . Esophageal reflux     Current Outpatient Prescriptions  Medication Sig Dispense Refill  . aspirin 325 MG tablet Take 325 mg by mouth daily.        Marland Kitchen atorvastatin (LIPITOR) 40 MG tablet Take 40 mg by mouth daily.        . clopidogrel (PLAVIX) 75 MG tablet Take 75 mg by mouth daily.        Marland Kitchen levothyroxine (SYNTHROID, LEVOTHROID) 125 MCG tablet Take 125 mcg by mouth daily.        Marland Kitchen lisinopril (PRINIVIL,ZESTRIL) 20 MG tablet Take 20 mg by mouth daily.        Marland Kitchen omeprazole (PRILOSEC) 20 MG capsule Take 20 mg by mouth daily.        . nitroGLYCERIN (NITROSTAT) 0.4 MG SL tablet Place 1 tablet (0.4 mg total) under the tongue every 5 (five) minutes as needed for chest pain.  25 tablet  6    No Known Allergies  No family history on file.  History   Social History  . Marital Status: Married    Spouse Name: N/A    Number of Children: N/A  . Years of Education: N/A   Occupational History  . Not on file.   Social History Main Topics  . Smoking status: Never Smoker   . Smokeless tobacco: Not on file  . Alcohol Use: No  . Drug Use: No  . Sexually Active: Not on file   Other Topics Concern  . Not on file   Social History Narrative  . No narrative on file    ROS ALL NEGATIVE EXCEPT THOSE NOTED IN HPI  PE  General Appearance: well developed, well nourished in no acute distress HEENT: symmetrical face, PERRLA, good dentition  Neck: no JVD, thyromegaly, or adenopathy, trachea midline Chest: symmetric without  deformity Cardiac: PMI non-displaced, RRR, normal S1, S2, no gallop or murmur Lung: clear to ausculation and percussion Vascular: all pulses full without bruits  Abdominal: nondistended, nontender, good bowel sounds, no HSM, no bruits Extremities: no cyanosis, clubbing or edema, no sign of DVT, no varicosities  Skin: normal color, no rashes Neuro: alert and oriented x 3, non-focal Pysch: normal affect  EKG Normal sinus rhythm, normal EKG BMET    Component Value Date/Time   NA 142 04/17/2010 0825   K 4.4 04/17/2010 0825   CL 108 04/17/2010 0825   CO2 27 04/17/2010 0825   GLUCOSE 112* 04/17/2010 0825   BUN 18 04/17/2010 0825   CREATININE 1.17 04/17/2010 0825   CALCIUM 9.5 04/17/2010 0825   GFRNONAA >60 04/17/2010 0825   GFRAA  Value: >60        The eGFR has been calculated using the MDRD equation. This calculation has not been validated in all clinical situations. eGFR's persistently <60 mL/min signify possible Chronic Kidney Disease. 04/17/2010 0825    Lipid Panel     Component Value Date/Time   CHOL 123 03/03/2008 0948   TRIG 150* 03/03/2008 7829  HDL 27.6* 03/03/2008 0948   CHOLHDL 4.5 CALC 03/03/2008 0948   VLDL 30 03/03/2008 0948   LDLCALC 65 03/03/2008 0948    CBC    Component Value Date/Time   WBC 5.6 04/17/2010 0825   RBC 4.73 04/17/2010 0825   HGB 15.2 04/17/2010 0825   HCT 44.2 04/17/2010 0825   PLT 157 04/17/2010 0825   MCV 93.3 04/17/2010 0825   MCHC 34.4 04/17/2010 0825   RDW 12.9 04/17/2010 0825

## 2011-12-13 NOTE — Assessment & Plan Note (Signed)
Stable. Patient given prescription and instructions for sublingual nitroglycerin.

## 2012-02-22 ENCOUNTER — Encounter: Payer: Self-pay | Admitting: Internal Medicine

## 2012-02-29 ENCOUNTER — Encounter: Payer: Self-pay | Admitting: Internal Medicine

## 2012-04-11 ENCOUNTER — Encounter: Payer: BC Managed Care – PPO | Admitting: Internal Medicine

## 2012-04-14 ENCOUNTER — Encounter: Payer: Self-pay | Admitting: *Deleted

## 2012-04-24 ENCOUNTER — Telehealth: Payer: Self-pay | Admitting: *Deleted

## 2012-04-24 ENCOUNTER — Encounter: Payer: Self-pay | Admitting: Internal Medicine

## 2012-04-24 ENCOUNTER — Ambulatory Visit (INDEPENDENT_AMBULATORY_CARE_PROVIDER_SITE_OTHER): Payer: BC Managed Care – PPO | Admitting: Internal Medicine

## 2012-04-24 VITALS — BP 124/80 | HR 76 | Ht 70.0 in | Wt 200.0 lb

## 2012-04-24 DIAGNOSIS — Z1211 Encounter for screening for malignant neoplasm of colon: Secondary | ICD-10-CM

## 2012-04-24 DIAGNOSIS — D689 Coagulation defect, unspecified: Secondary | ICD-10-CM

## 2012-04-24 DIAGNOSIS — Z86010 Personal history of colonic polyps: Secondary | ICD-10-CM

## 2012-04-24 MED ORDER — PEG-KCL-NACL-NASULF-NA ASC-C 100 G PO SOLR: 1.0000 | Freq: Once | ORAL | Status: DC

## 2012-04-24 NOTE — Progress Notes (Signed)
ARIZ TERRONES 09/25/1946 MRN [id number]   History of Present Illness:  This is a 66 year old white male that coronary artery disease and is status post coronary intervention in 2007 by Dr. Samule Ohm, with placement of a drug-eluting stent in the LAD. He has been on Plavix and aspirin since then. He has been followed by Dr.Wall. He is here to discuss having a recall colonoscopy. Patient had a tubular adenoma removed in 2003. A subsequent colonoscopy in February 2006 was essentially normal. He is due now for a recall colonoscopy. He denies any symptoms of rectal bleeding, abdominal pain or irregular bowel habits. He was able to discontinue his Plavix for a urological procedure by Dr. Annabell Howells 4 years ago. He has a history of an esophageal stricture which was dilated in 2004. He has been on Prilosec 20 mg daily without any recurrent problems.    Past Medical History  Diagnosis Date  . Coronary atherosclerosis of native coronary artery   . Hyperlipidemia   . Cervicalgia   . Unspecified hypothyroidism   . Esophageal reflux   . Hypertension   . Hx of adenomatous colonic polyps   . Esophageal stricture   . Myocardial infarct   . Heart murmur    Past Surgical History  Procedure Date  . Tonsillectomy and adenoidectomy   . Transurethral resection of bladder 07/09/06    tumor 2-5 cm  . Skin tag removal 07/09/06    right anterior thigh  . Coronary angioplasty with stent placement     reports that he has quit smoking. He has never used smokeless tobacco. He reports that he does not drink alcohol or use illicit drugs. family history includes Heart disease in his father.  There is no history of Colon cancer. No Known Allergies      Review of Systems:negative for dysphagia heartburn chest pain abdominal pain or change in bowel habits   The remainder of the 10 point ROS is negative except as outlined in H&P   Physical Exam: General appearance  Well developed, in no distress. Neurological alert  and oriented x 3. Psychological normal mood and affect.  Assessment and Plan:  Problem #1 66 year old white male who is due for a recall colonoscopy to follow up on adenomatous polyp from 2003. He had a normal colonoscopy in 2006. He is on Plavix and aspirin. We will check with Dr. Daleen Squibb to see whether he could hold his Plavix for 5 days prior to the colonoscopy. His last coronary  intervention was in 2008 using a drug-eluting stent in LAD. We will set him up for a colonoscopy and I will discuss the anticoagulation with Dr. Daleen Squibb in the meantime.   04/24/2012 Lina Sar

## 2012-04-24 NOTE — Telephone Encounter (Signed)
04/24/2012    RE: Frederick Miles DOB: [birth year] MRN: [id number]   Dear Dr Daleen Squibb,    We have scheduled the above patient for a colonoscopy. Our records show that he is on anticoagulation therapy.   Please advise if the patient may come off his therapy of Plavix 5 days prior to the procedure, which is scheduled for 06/11/12.  Please fax back/ or route the completed form to [name], CMA at [phone].   Sincerely,  Vernia Buff

## 2012-04-24 NOTE — Patient Instructions (Signed)
You have been scheduled for a colonoscopy with propofol. Please follow written instructions given to you at your visit today.  Please pick up your prep kit at the pharmacy within the next 1-3 days. You will be contaced by our office prior to your procedure for directions on holding your Coumadin/Warfarin.  If you do not hear from our office 1 week prior to your scheduled procedure, please call [phone] to discuss. CC: Dr Creola Corn, Dr Valera Castle

## 2012-04-30 NOTE — Telephone Encounter (Signed)
Okay to come off Plavix.

## 2012-04-30 NOTE — Telephone Encounter (Signed)
I have spoken to patient to advise him that Dr Daleen Squibb has okayed him to hold Plavix for 5 days prior to procedure. Patient verbalizes understanding.

## 2012-05-21 ENCOUNTER — Ambulatory Visit: Payer: BC Managed Care – PPO | Admitting: Internal Medicine

## 2012-06-11 ENCOUNTER — Ambulatory Visit (AMBULATORY_SURGERY_CENTER): Payer: BC Managed Care – PPO | Admitting: Internal Medicine

## 2012-06-11 ENCOUNTER — Encounter: Payer: Self-pay | Admitting: Internal Medicine

## 2012-06-11 VITALS — BP 154/89 | HR 70 | Temp 98.9°F | Resp 20 | Ht 70.0 in | Wt 200.0 lb

## 2012-06-11 DIAGNOSIS — Z86010 Personal history of colonic polyps: Secondary | ICD-10-CM

## 2012-06-11 DIAGNOSIS — Z1211 Encounter for screening for malignant neoplasm of colon: Secondary | ICD-10-CM

## 2012-06-11 MED ORDER — SODIUM CHLORIDE 0.9 % IV SOLN: 500.0000 mL | INTRAVENOUS | Status: DC

## 2012-06-11 NOTE — Progress Notes (Signed)
Patient did not experience any of the following events: a burn prior to discharge; a fall within the facility; wrong site/side/patient/procedure/implant event; or a hospital transfer or hospital admission upon discharge from the facility. (G8907) Patient did not have preoperative order for IV antibiotic SSI prophylaxis. (G8918)  

## 2012-06-11 NOTE — Patient Instructions (Addendum)
Discharge instructions given with verbal understanding. Handouts on hemorrhoids and diverticulosis given. Resume previous medications. YOU HAD AN ENDOSCOPIC PROCEDURE TODAY AT THE [facility]: Refer to the procedure report that was given to you for any specific questions about what was found during the examination.  If the procedure report does not answer your questions, please call your gastroenterologist to clarify.  If you requested that your care partner not be given the details of your procedure findings, then the procedure report has been included in a sealed envelope for you to review at your convenience later.  YOU SHOULD EXPECT: Some feelings of bloating in the abdomen. Passage of more gas than usual.  Walking can help get rid of the air that was put into your GI tract during the procedure and reduce the bloating. If you had a lower endoscopy (such as a colonoscopy or flexible sigmoidoscopy) you may notice spotting of blood in your stool or on the toilet paper. If you underwent a bowel prep for your procedure, then you may not have a normal bowel movement for a few days.  DIET: Your first meal following the procedure should be a light meal and then it is ok to progress to your normal diet.  A half-sandwich or bowl of soup is an example of a good first meal.  Heavy or fried foods are harder to digest and may make you feel nauseous or bloated.  Likewise meals heavy in dairy and vegetables can cause extra gas to form and this can also increase the bloating.  Drink plenty of fluids but you should avoid alcoholic beverages for 24 hours.  ACTIVITY: Your care partner should take you home directly after the procedure.  You should plan to take it easy, moving slowly for the rest of the day.  You can resume normal activity the day after the procedure however you should NOT DRIVE or use heavy machinery for 24 hours (because of the sedation medicines used during the test).    SYMPTOMS TO REPORT  IMMEDIATELY: A gastroenterologist can be reached at any hour.  During normal business hours, 8:30 AM to 5:00 PM Monday through Friday, call [phone].  After hours and on weekends, please call the GI answering service at [phone] who will take a message and have the physician on call contact you.   Following lower endoscopy (colonoscopy or flexible sigmoidoscopy):  Excessive amounts of blood in the stool  Significant tenderness or worsening of abdominal pains  Swelling of the abdomen that is new, acute  Fever of 100F or higher  FOLLOW UP: If any biopsies were taken you will be contacted by phone or by letter within the next 1-3 weeks.  Call your gastroenterologist if you have not heard about the biopsies in 3 weeks.  Our staff will call the home number listed on your records the next business day following your procedure to check on you and address any questions or concerns that you may have at that time regarding the information given to you following your procedure. This is a courtesy call and so if there is no answer at the home number and we have not heard from you through the emergency physician on call, we will assume that you have returned to your regular daily activities without incident.  SIGNATURES/CONFIDENTIALITY: You and/or your care partner have signed paperwork which will be entered into your electronic medical record.  These signatures attest to the fact that that the information above on your After Visit  Summary has been reviewed and is understood.  Full responsibility of the confidentiality of this discharge information lies with you and/or your care-partner.  

## 2012-06-11 NOTE — Op Note (Signed)
[facility] [address] [place], [place]  [address]  COLONOSCOPY PROCEDURE REPORT  PATIENT:  Frederick, Miles  MR#:  [id number] BIRTHDATE:  1946/01/26, 65 yrs. old  GENDER:  male ENDOSCOPIST:  Hedwig Morton. Juanda Chance, MD REF. BY:  Creola Corn, M.D. PROCEDURE DATE:  06/11/2012 PROCEDURE:  Colonoscopy 62130 ASA CLASS:  Class II INDICATIONS:  history of pre-cancerous (adenomatous) colon polyps tub. adenoma 2006 MEDICATIONS:   MAC sedation, administered by CRNA, propofol (Diprivan) 250 mg  DESCRIPTION OF PROCEDURE:   After the risks and benefits and of the procedure were explained, informed consent was obtained. Digital rectal exam was performed and revealed no rectal masses. The LB [id number] endoscope was introduced through the anus and advanced to the cecum, which was identified by both the appendix and ileocecal valve.  The quality of the prep was good, using MoviPrep.  The instrument was then slowly withdrawn as the colon was fully examined. <<PROCEDUREIMAGES>>  FINDINGS:  Internal Hemorrhoids were found (see image6 and image7).  Mild diverticulosis was found (see image1).  This was otherwise a normal examination of the colon (see image2, image3, image4, and image5).   Retroflexed views in the rectum revealed no abnormalities.    The scope was then withdrawn from the patient and the procedure completed.  COMPLICATIONS:  None ENDOSCOPIC IMPRESSION: 1) Internal hemorrhoids 2) Mild diverticulosis 3) Otherwise normal examination RECOMMENDATIONS: 1) High fiber diet.  REPEAT EXAM:  In 10 year(s) for.  ______________________________ Hedwig Morton. Juanda Chance, MD  CC:  n. eSIGNED:   Hedwig Morton. Ayrianna Mcginniss at 06/11/2012 04:24 PM  Alwyn Ren, [phone]

## 2012-06-12 ENCOUNTER — Telehealth: Payer: Self-pay | Admitting: *Deleted

## 2012-06-12 NOTE — Telephone Encounter (Signed)
  Follow up Call-  Call back number 06/11/2012  Post procedure Call Back phone  # [phone]  Permission to leave phone message Yes     Patient questions:  Do you have a fever, pain , or abdominal swelling? no Pain Score  0 *  Have you tolerated food without any problems? yes  Have you been able to return to your normal activities? yes  Do you have any questions about your discharge instructions: Diet   no Medications  no Follow up visit  no  Do you have questions or concerns about your Care? no  Actions: * If pain score is 4 or above: No action needed, pain <4.

## 2012-08-04 ENCOUNTER — Encounter (HOSPITAL_BASED_OUTPATIENT_CLINIC_OR_DEPARTMENT_OTHER): Payer: Self-pay | Admitting: Anesthesiology

## 2012-08-04 ENCOUNTER — Encounter (HOSPITAL_BASED_OUTPATIENT_CLINIC_OR_DEPARTMENT_OTHER)
Admission: RE | Disposition: A | Discharge status: Home / Self Care | Payer: Self-pay | Source: Ambulatory Visit | Attending: Urology

## 2012-08-04 ENCOUNTER — Encounter (HOSPITAL_BASED_OUTPATIENT_CLINIC_OR_DEPARTMENT_OTHER): Payer: Self-pay | Admitting: *Deleted

## 2012-08-04 ENCOUNTER — Other Ambulatory Visit: Payer: Self-pay | Admitting: Urology

## 2012-08-04 ENCOUNTER — Ambulatory Visit (HOSPITAL_BASED_OUTPATIENT_CLINIC_OR_DEPARTMENT_OTHER)
Admission: RE | Admit: 2012-08-04 | Discharge: 2012-08-04 | Disposition: A | Discharge status: Home / Self Care | Payer: BC Managed Care – PPO | Source: Ambulatory Visit | Attending: Urology | Admitting: Urology

## 2012-08-04 ENCOUNTER — Ambulatory Visit (HOSPITAL_BASED_OUTPATIENT_CLINIC_OR_DEPARTMENT_OTHER): Payer: BC Managed Care – PPO | Admitting: Anesthesiology

## 2012-08-04 DIAGNOSIS — N210 Calculus in bladder: Secondary | ICD-10-CM | POA: Insufficient documentation

## 2012-08-04 DIAGNOSIS — N201 Calculus of ureter: Secondary | ICD-10-CM | POA: Insufficient documentation

## 2012-08-04 DIAGNOSIS — I252 Old myocardial infarction: Secondary | ICD-10-CM | POA: Insufficient documentation

## 2012-08-04 DIAGNOSIS — K219 Gastro-esophageal reflux disease without esophagitis: Secondary | ICD-10-CM | POA: Insufficient documentation

## 2012-08-04 DIAGNOSIS — Z7982 Long term (current) use of aspirin: Secondary | ICD-10-CM | POA: Insufficient documentation

## 2012-08-04 DIAGNOSIS — E039 Hypothyroidism, unspecified: Secondary | ICD-10-CM | POA: Insufficient documentation

## 2012-08-04 DIAGNOSIS — I714 Abdominal aortic aneurysm, without rupture, unspecified: Secondary | ICD-10-CM | POA: Insufficient documentation

## 2012-08-04 DIAGNOSIS — Z79899 Other long term (current) drug therapy: Secondary | ICD-10-CM | POA: Insufficient documentation

## 2012-08-04 DIAGNOSIS — N400 Enlarged prostate without lower urinary tract symptoms: Secondary | ICD-10-CM | POA: Insufficient documentation

## 2012-08-04 DIAGNOSIS — N200 Calculus of kidney: Secondary | ICD-10-CM | POA: Insufficient documentation

## 2012-08-04 DIAGNOSIS — Z8551 Personal history of malignant neoplasm of bladder: Secondary | ICD-10-CM | POA: Insufficient documentation

## 2012-08-04 DIAGNOSIS — Z7902 Long term (current) use of antithrombotics/antiplatelets: Secondary | ICD-10-CM | POA: Insufficient documentation

## 2012-08-04 LAB — POCT I-STAT 4, (NA,K, GLUC, HGB,HCT)
Glucose, Bld: 102 mg/dL — ABNORMAL HIGH (ref 70–99)
HCT: 43 % (ref 39.0–52.0)
Hemoglobin: 14.6 g/dL (ref 13.0–17.0)
Potassium: 4.1 mEq/L (ref 3.5–5.1)
Sodium: 141 mEq/L (ref 135–145)

## 2012-08-04 SURGERY — CYSTOSCOPY, WITH STENT INSERTION
Anesthesia: General | Site: Ureter | Laterality: Left | Wound class: Clean Contaminated

## 2012-08-04 MED ORDER — SODIUM CHLORIDE 0.9 % IJ SOLN: 3.0000 mL | Freq: Two times a day (BID) | INTRAMUSCULAR | Status: DC

## 2012-08-04 MED ORDER — PROPOFOL 10 MG/ML IV EMUL
INTRAVENOUS | Status: DC | PRN
  Administered 2012-08-04: 150 mg via INTRAVENOUS

## 2012-08-04 MED ORDER — LACTATED RINGERS IV SOLN
INTRAVENOUS | Status: DC
  Administered 2012-08-04: 12:00:00 via INTRAVENOUS

## 2012-08-04 MED ORDER — HYDROMORPHONE HCL PF 1 MG/ML IJ SOLN: 0.2500 mg | INTRAMUSCULAR | Status: DC | PRN

## 2012-08-04 MED ORDER — FENTANYL CITRATE 0.05 MG/ML IJ SOLN
INTRAMUSCULAR | Status: DC | PRN
  Administered 2012-08-04: 50 ug via INTRAVENOUS

## 2012-08-04 MED ORDER — SODIUM CHLORIDE 0.9 % IV SOLN: 250.0000 mL | INTRAVENOUS | Status: DC | PRN

## 2012-08-04 MED ORDER — OXYCODONE-ACETAMINOPHEN 5-325 MG PO TABS: 1.0000 | ORAL_TABLET | ORAL | Status: AC | PRN

## 2012-08-04 MED ORDER — LACTATED RINGERS IV SOLN: INTRAVENOUS | Status: DC

## 2012-08-04 MED ORDER — FENTANYL CITRATE 0.05 MG/ML IJ SOLN: 25.0000 ug | INTRAMUSCULAR | Status: DC | PRN

## 2012-08-04 MED ORDER — SODIUM CHLORIDE 0.9 % IJ SOLN: 3.0000 mL | INTRAMUSCULAR | Status: DC | PRN

## 2012-08-04 MED ORDER — SODIUM CHLORIDE 0.9 % IR SOLN
Status: DC | PRN
  Administered 2012-08-04: 3000 mL

## 2012-08-04 MED ORDER — PHENAZOPYRIDINE HCL 200 MG PO TABS
200.0000 mg | ORAL_TABLET | Freq: Three times a day (TID) | ORAL | Status: DC
  Administered 2012-08-04: 200 mg via ORAL

## 2012-08-04 MED ORDER — CIPROFLOXACIN IN D5W 400 MG/200ML IV SOLN
400.0000 mg | INTRAVENOUS | Status: AC
  Administered 2012-08-04: 400 mg via INTRAVENOUS

## 2012-08-04 MED ORDER — PROMETHAZINE HCL 25 MG/ML IJ SOLN: 6.2500 mg | INTRAMUSCULAR | Status: DC | PRN

## 2012-08-04 MED ORDER — PHENAZOPYRIDINE HCL 200 MG PO TABS: 200.0000 mg | ORAL_TABLET | Freq: Three times a day (TID) | ORAL | Status: AC | PRN

## 2012-08-04 MED ORDER — LIDOCAINE HCL (CARDIAC) 20 MG/ML IV SOLN
INTRAVENOUS | Status: DC | PRN
  Administered 2012-08-04: 50 mg via INTRAVENOUS

## 2012-08-04 MED ORDER — DEXAMETHASONE SODIUM PHOSPHATE 4 MG/ML IJ SOLN
INTRAMUSCULAR | Status: DC | PRN
  Administered 2012-08-04 (×2): 5 mg via INTRAVENOUS

## 2012-08-04 MED ORDER — ACETAMINOPHEN 325 MG PO TABS: 650.0000 mg | ORAL_TABLET | ORAL | Status: DC | PRN

## 2012-08-04 MED ORDER — MIDAZOLAM HCL 5 MG/5ML IJ SOLN
INTRAMUSCULAR | Status: DC | PRN
  Administered 2012-08-04: 1 mg via INTRAVENOUS

## 2012-08-04 MED ORDER — ONDANSETRON HCL 4 MG/2ML IJ SOLN
INTRAMUSCULAR | Status: DC | PRN
  Administered 2012-08-04: 4 mg via INTRAVENOUS

## 2012-08-04 MED ORDER — ONDANSETRON HCL 4 MG/2ML IJ SOLN: 4.0000 mg | Freq: Four times a day (QID) | INTRAMUSCULAR | Status: DC | PRN

## 2012-08-04 MED ORDER — ACETAMINOPHEN 650 MG RE SUPP: 650.0000 mg | RECTAL | Status: DC | PRN

## 2012-08-04 MED ORDER — EPHEDRINE SULFATE 50 MG/ML IJ SOLN
INTRAMUSCULAR | Status: DC | PRN
  Administered 2012-08-04 (×3): 10 mg via INTRAVENOUS

## 2012-08-04 MED ORDER — OXYCODONE HCL 5 MG PO TABS: 5.0000 mg | ORAL_TABLET | ORAL | Status: DC | PRN

## 2012-08-04 SURGICAL SUPPLY — 17 items
BAG DRAIN URO-CYSTO SKYTR STRL (DRAIN) ×2 IMPLANT
BAG DRN UROCATH (DRAIN) ×1
CANISTER SUCT LVC 12 LTR MEDI- (MISCELLANEOUS) ×1 IMPLANT
CATH URET 5FR 28IN CONE TIP (BALLOONS)
CATH URET 5FR 28IN OPEN ENDED (CATHETERS) ×2 IMPLANT
CATH URET 5FR 70CM CONE TIP (BALLOONS) IMPLANT
CLOTH BEACON ORANGE TIMEOUT ST (SAFETY) ×2 IMPLANT
DRAPE CAMERA CLOSED 9X96 (DRAPES) ×2 IMPLANT
GLOVE SURG SS PI 8.0 STRL IVOR (GLOVE) ×2 IMPLANT
GOWN PREVENTION PLUS LG XLONG (DISPOSABLE) ×2 IMPLANT
GOWN STRL REIN XL XLG (GOWN DISPOSABLE) ×2 IMPLANT
GUIDEWIRE 0.038 PTFE COATED (WIRE) IMPLANT
GUIDEWIRE ANG ZIPWIRE 038X150 (WIRE) IMPLANT
GUIDEWIRE STR DUAL SENSOR (WIRE) ×2 IMPLANT
NS IRRIG 500ML POUR BTL (IV SOLUTION) IMPLANT
PACK CYSTOSCOPY (CUSTOM PROCEDURE TRAY) ×2 IMPLANT
STENT URET 6FRX26 CONTOUR (STENTS) ×1 IMPLANT

## 2012-08-04 NOTE — Transfer of Care (Signed)
Immediate Anesthesia Transfer of Care Note  Patient: Frederick Miles  Procedure(s) Performed: Procedure(s) (LRB): CYSTOSCOPY WITH STENT PLACEMENT (Left)  Patient Location: PACU  Anesthesia Type: General  Level of Consciousness: awake, alert  and oriented  Airway & Oxygen Therapy: Patient Spontanous Breathing and Patient connected to face mask oxygen  Post-op Assessment: Report given to PACU RN and Post -op Vital signs reviewed and stable  Post vital signs: Reviewed and stable  Complications: No apparent anesthesia complications

## 2012-08-04 NOTE — Anesthesia Procedure Notes (Signed)
Procedure Name: LMA Insertion Date/Time: 08/04/2012 1:35 PM Performed by: Norva Pavlov Pre-anesthesia Checklist: Patient identified, Emergency Drugs available, Suction available and Patient being monitored Patient Re-evaluated:Patient Re-evaluated prior to inductionOxygen Delivery Method: Circle System Utilized Preoxygenation: Pre-oxygenation with 100% oxygen Intubation Type: IV induction Ventilation: Mask ventilation without difficulty LMA: LMA inserted LMA Size: 4.0 Number of attempts: 1 Airway Equipment and Method: bite block Placement Confirmation: positive ETCO2 Tube secured with: Tape Dental Injury: Teeth and Oropharynx as per pre-operative assessment

## 2012-08-04 NOTE — Progress Notes (Signed)
Chart reviewed w Dr. Okey Dupre.  Ok to proceed.

## 2012-08-04 NOTE — Progress Notes (Signed)
Last office visit note, cardiac testing requested from [facility] cardiology

## 2012-08-04 NOTE — Progress Notes (Signed)
Spoke w pt.  Instructed  Beginning now.  No candy , mints, or gum. Ok to take pain med w sip of water if needed. ekg w chart.  Needs istat on arrival.

## 2012-08-04 NOTE — Brief Op Note (Signed)
08/04/2012  1:50 PM  PATIENT:  Frederick Miles  67 y.o. male  PRE-OPERATIVE DIAGNOSIS:  left proximal stone  POST-OPERATIVE DIAGNOSIS:  left proximal stone  PROCEDURE:  Procedure(s) (LRB): CYSTOSCOPY WITH STENT PLACEMENT (Left)  SURGEON:  Surgeon(s) and Role:    * Anner Crete, MD - Primary  PHYSICIAN ASSISTANT:   ASSISTANTS: none   ANESTHESIA:   general  EBL:  Total I/O In: 200 [I.V.:200] Out: -   BLOOD ADMINISTERED:none  DRAINS: 6x26 left JJ stent.   LOCAL MEDICATIONS USED:  NONE  SPECIMEN:  No Specimen  DISPOSITION OF SPECIMEN:  N/A  COUNTS:  YES  TOURNIQUET:  * No tourniquets in log *  DICTATION: .Other Dictation: Dictation Number [id number]  PLAN OF CARE: Discharge to home after PACU  PATIENT DISPOSITION:  PACU - hemodynamically stable.   Delay start of Pharmacological VTE agent (>24hrs) due to surgical blood loss or risk of bleeding: yes

## 2012-08-04 NOTE — Interval H&P Note (Signed)
History and Physical Interval Note:  08/04/2012 1:23 PM  Frederick Miles  has presented today for surgery, with the diagnosis of left proximal stone  The various methods of treatment have been discussed with the patient and family. After consideration of risks, benefits and other options for treatment, the patient has consented to  Procedure(s) (LRB): CYSTOSCOPY WITH STENT PLACEMENT (Left) as a surgical intervention .  The patient's history has been reviewed, patient examined, no change in status, stable for surgery.  I have reviewed the patient's chart and labs.  Questions were answered to the patient's satisfaction.     Frederick Miles

## 2012-08-04 NOTE — Anesthesia Preprocedure Evaluation (Addendum)
Anesthesia Evaluation  Patient identified by MRN, date of birth, ID band Patient awake    Reviewed: Allergy & Precautions, H&P , NPO status , Patient's Chart, lab work & pertinent test results  Airway Mallampati: III TM Distance: >3 FB Neck ROM: Full    Dental  (+) Teeth Intact and Dental Advisory Given   Pulmonary neg pulmonary ROS,  breath sounds clear to auscultation  Pulmonary exam normal       Cardiovascular hypertension, Pt. on medications + CAD, + Past MI and + Cardiac Stents + Valvular Problems/Murmurs Rhythm:Regular Rate:Normal     Neuro/Psych negative neurological ROS  negative psych ROS   GI/Hepatic Neg liver ROS, GERD-  Medicated,  Endo/Other  Hypothyroidism   Renal/GU negative Renal ROS  negative genitourinary   Musculoskeletal negative musculoskeletal ROS (+)   Abdominal   Peds negative pediatric ROS (+)  Hematology negative hematology ROS (+)   Anesthesia Other Findings   Reproductive/Obstetrics negative OB ROS                          Anesthesia Physical Anesthesia Plan  ASA: III  Anesthesia Plan: General   Post-op Pain Management:    Induction: Intravenous  Airway Management Planned: LMA  Additional Equipment:   Intra-op Plan:   Post-operative Plan: Extubation in OR  Informed Consent: I have reviewed the patients History and Physical, chart, labs and discussed the procedure including the risks, benefits and alternatives for the proposed anesthesia with the patient or authorized representative who has indicated his/her understanding and acceptance.   Dental advisory given  Plan Discussed with: CRNA  Anesthesia Plan Comments:         Anesthesia Quick Evaluation

## 2012-08-04 NOTE — H&P (Signed)
ms Problems  1. 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 Microscopic Hematuria 599.72 5. Mid Ureteral Stone On The Left 592.1 6. Nephrolithiasis 592.0 7. 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  He was seen on Friday and has a 6mm stone in the proximal ureter.   He has continued since Friday and the position moved some but he is hurting the left flank again.   The pain is intermittantly severe but controlled with vicodin.  He has had hematuria.  He has had no nausea.   Past Medical History Problems  1. History of  Acute Myocardial Infarction V12.59 2. 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 History of  Cath Stent Placement 4. History of  Esophageal Reflux 530.81 5. 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 History of  Microscopic Hematuria 599.72  Surgical History Problems  1. History of  Cystoscopy With Fulguration Small Lesion (5-11mm) 2. History of  Heart Surgery  Current Meds 1. Aspirin 325 MG Oral Tablet; Therapy: (Recorded:09Dec2008) to 2. Hydrocodone-Acetaminophen 7.5-650 MG Oral Tablet; TAKE 1 TABLET EVERY 4 HOURS AS  NEEDED FOR PAIN; Therapy: 02Aug2013 to (Evaluate:16Aug2013); Last Rx:02Aug2013 3. Lipitor 80 MG Oral Tablet; Therapy: 13Dec2007 to 4. Lisinopril 20 MG Oral Tablet; Therapy: 22Oct2012 to 5. Nitrostat 0.4 MG Sublingual Tablet Sublingual; Therapy: 13Dec2012 to 6. Plavix 75 MG Oral Tablet; Therapy: 13Dec2007 to 7. PriLOSEC OTC 20 MG Oral Tablet Delayed Release; Therapy: (Recorded:01Aug2008) to 8. Synthroid 125 MCG Oral Tablet; Therapy: 16Oct2007 to 9. Tamsulosin HCl 0.4 MG Oral Capsule; TAKE 1 CAPSULE Bedtime; Therapy: 02Aug2013 to  (Evaluate:28Jul2014)  Requested for: 02Aug2013; Last Rx:02Aug2013  Allergies Medication  1. No  Known Drug Allergies  Family History Problems  1. Family history of  Family Health Status Number Of Children 2 sons 2. Family history of  Prostate Cancer V16.42  Social History Problems  1. Caffeine Use 2 coffee 2. Marital History - Currently Married 3. History of  Tobacco Use V15.82 quit 20 years ago, smoked for 10 years  Review of Systems  Gastrointestinal: nausea.  Constitutional: no fever.  Cardiovascular: no chest pain.  Respiratory: no shortness of breath.    Vitals Vital Signs [Data Includes: Last 1 Day]  05Aug2013 08:12AM  Blood Pressure: 134 / 79 Temperature: 98.2 F Heart Rate: 76  Physical Exam Constitutional: Well nourished and well developed . No acute distress.  Pulmonary: No respiratory distress and normal respiratory rhythm and effort.  Cardiovascular: Heart rate and rhythm are normal . No peripheral edema.  Abdomen: The abdomen is soft and nontender (except for the LCVAT). mild left CVA tenderness.    Results/Data Urine [Data Includes: Last 1 Day]   05Aug2013  COLOR YELLOW   APPEARANCE CLEAR   SPECIFIC GRAVITY 1.025   pH 5.5   GLUCOSE NEG mg/dL  BILIRUBIN NEG   KETONE NEG mg/dL  BLOOD NEG   PROTEIN NEG mg/dL  UROBILINOGEN 1 mg/dL  NITRITE NEG   LEUKOCYTE ESTERASE NEG    The following images/tracing/specimen were independently visualized:  KUB shows a 6x46mm Left proximal stone adjacent to the maximal Aortic calcification. There is a right renal stone. No other abnormalities are noted.    Assessment Assessed  1. Mid Ureteral Stone On The Left 592.1   He has a symptomatic left ureteral stone that  is adjacent to his calcified aneurysm and he is on Plavix and ASA.   Plan Health Maintenance (V70.0)  1. UA With REFLEX  Done: 05Aug2013 07:59AM   I am going to set him up for a stent today and then he will need ureteroscopy at a later date.  I don't think I would want to do ESWL with the stone next to the calcified aneurysm. I reviewed the risks  of bleeding, infection, ureteral injury, thrombotic events and anesthetic complications.

## 2012-08-05 NOTE — Anesthesia Postprocedure Evaluation (Signed)
Anesthesia Post Note  Patient: Frederick Miles  Procedure(s) Performed: Procedure(s) (LRB): CYSTOSCOPY WITH STENT PLACEMENT (Left)  Anesthesia type: General  Patient location: PACU  Post pain: Pain level controlled  Post assessment: Post-op Vital signs reviewed  Last Vitals:  Filed Vitals:   08/04/12 1532  BP: 135/86  Pulse:   Temp: 36.1 C  Resp: 14    Post vital signs: Reviewed  Level of consciousness: sedated  Complications: No apparent anesthesia complications

## 2012-08-05 NOTE — Op Note (Signed)
NAMEFLEETWOOD, PIERRON NO.:  [id number]  MEDICAL RECORD NO.:  [id number]  LOCATION:  PERIO                        FACILITY:  [facility]  PHYSICIAN:  Excell Seltzer. Annabell Howells, M.D.    DATE OF BIRTH:  [birth year]  DATE OF PROCEDURE:  08/04/2012 DATE OF DISCHARGE:                              OPERATIVE REPORT   PROCEDURE:  Cystoscopy, placement of left double-J stent.  PREOPERATIVE DIAGNOSIS:  Left proximal stone.  POSTOPERATIVE DIAGNOSIS:  Left proximal stone with very small bladder stones.  SURGEON:  Excell Seltzer. Annabell Howells, M.D.  ANESTHESIA:  General.  SPECIMEN:  None.  DRAINS:  A 6-French x 26 cm left double-J stent.  COMPLICATIONS:  None.  INDICATIONS:  Mr. Massenburg is a 66 year old white male with a 6 x 8 mm left proximal stone, who is on Plavix and aspirin.  He has elected to undergo stent placement for pain relief from left ureteroscopy at later date.  FINDINGS FOR PROCEDURES:  Given Cipro, he was taken to the operating room where general anesthetic was induced.  He was placed in a lithotomy position.  He was fitted with PAS hose.  Perineum and genitalia were prepped with Betadine solution and draped in usual sterile fashion.  Cystoscopy was performed using a 22-French scope with a 12-degree lens. Examination revealed a normal urethra.  The external sphincter was intact.  The prostatic urethra had bilobar hyperplasia with a length approximately 2-3 cm with mild obstruction.  Examination of bladder revealed mild trabeculation.  No tumors or inflammation were noted. There was a 1 mm bladder stone at the base of the bladder.  Ureteral orifices were unremarkable.  The bladder stone was evacuated from the bladder.  A guidewire was then passed up the left ureteral orifice to the kidney.  A 6-French 26-cm double-J stent was passed without difficulty to the kidney under fluoroscopic guidance. Wire was removed leaving good coil in the kidney and a good coil in the bladder.   Bladder was drained.  The patient was taken down from a lithotomy position.  His anesthetic was reversed.  He was moved to recovery room in stable condition.  There were no complications.     Excell Seltzer. Annabell Howells, M.D.     JJW/MEDQ  D:  08/04/2012  T:  08/05/2012  Job:  [id number]

## 2012-08-06 ENCOUNTER — Encounter (HOSPITAL_COMMUNITY)
Admission: RE | Admit: 2012-08-06 | Discharge: 2012-08-06 | Disposition: A | Discharge status: Home / Self Care | Payer: BC Managed Care – PPO | Source: Ambulatory Visit | Attending: Urology | Admitting: Urology

## 2012-08-06 ENCOUNTER — Encounter (HOSPITAL_COMMUNITY): Payer: Self-pay | Admitting: Pharmacy Technician

## 2012-08-06 ENCOUNTER — Ambulatory Visit (HOSPITAL_COMMUNITY)
Admission: RE | Admit: 2012-08-06 | Discharge: 2012-08-06 | Disposition: A | Discharge status: Home / Self Care | Payer: BC Managed Care – PPO | Source: Ambulatory Visit | Attending: Urology | Admitting: Urology

## 2012-08-06 ENCOUNTER — Encounter (HOSPITAL_COMMUNITY): Payer: Self-pay

## 2012-08-06 DIAGNOSIS — N201 Calculus of ureter: Secondary | ICD-10-CM | POA: Insufficient documentation

## 2012-08-06 DIAGNOSIS — Z01812 Encounter for preprocedural laboratory examination: Secondary | ICD-10-CM | POA: Insufficient documentation

## 2012-08-06 DIAGNOSIS — M795 Residual foreign body in soft tissue: Secondary | ICD-10-CM | POA: Insufficient documentation

## 2012-08-06 DIAGNOSIS — Z01818 Encounter for other preprocedural examination: Secondary | ICD-10-CM | POA: Insufficient documentation

## 2012-08-06 LAB — CBC
HCT: 38.8 % — ABNORMAL LOW (ref 39.0–52.0)
Hemoglobin: 13.7 g/dL (ref 13.0–17.0)
MCH: 32 pg (ref 26.0–34.0)
MCHC: 35.3 g/dL (ref 30.0–36.0)
MCV: 90.7 fL (ref 78.0–100.0)
Platelets: 183 10*3/uL (ref 150–400)
RBC: 4.28 MIL/uL (ref 4.22–5.81)
RDW: 12.9 % (ref 11.5–15.5)
WBC: 8.3 10*3/uL (ref 4.0–10.5)

## 2012-08-06 LAB — BASIC METABOLIC PANEL WITH GFR
BUN: 19 mg/dL (ref 6–23)
CO2: 28 meq/L (ref 19–32)
Calcium: 9 mg/dL (ref 8.4–10.5)
Chloride: 102 meq/L (ref 96–112)
Creatinine, Ser: 1.1 mg/dL (ref 0.50–1.35)
GFR calc Af Amer: 80 mL/min — ABNORMAL LOW
GFR calc non Af Amer: 69 mL/min — ABNORMAL LOW
Glucose, Bld: 87 mg/dL (ref 70–99)
Potassium: 4.4 meq/L (ref 3.5–5.1)
Sodium: 139 meq/L (ref 135–145)

## 2012-08-06 LAB — SURGICAL PCR SCREEN
MRSA, PCR: NEGATIVE
Staphylococcus aureus: NEGATIVE

## 2012-08-06 NOTE — Pre-Procedure Instructions (Signed)
08-06-12 CXR done today. EKG( 12-1311)-Epic.

## 2012-08-06 NOTE — Patient Instructions (Addendum)
21 Frederick Miles  08/06/2012   Your procedure is scheduled on:  8-9 -2013  Report to [facility] at      0530  AM .  Call this number if you have problems the morning of surgery: [phone] or / [phone] Presurgical Testing prior   Remember:   Do not eat food:After Midnight.    Take these medicines the morning of surgery with A SIP OF WATER: oxycodone,hydrocodone,Synthroid Omeprazole,Flomax.   Do not wear jewelry, make-up or nail polish.  Do not wear lotions, powders, or perfumes. You may wear deodorant.  Do not shave 48 hours prior to surgery.(face and neck okay, no shaving of legs)  Do not bring valuables to the hospital.  Contacts, dentures or bridgework may not be worn into surgery.  Leave suitcase in the car. After surgery it may be brought to your room.  For patients admitted to the hospital, checkout time is 11:00 AM the day of discharge.   Patients discharged the day of surgery will not be allowed to drive home.  Name and phone number of your driver:spouse   Special Instructions: CHG Shower Use Special Wash: 1/2 bottle night before surgery and 1/2 bottle morning of surgery.(avoid face and genitals)   Please read over the following fact sheets that you were given: MRSA Information.

## 2012-08-08 ENCOUNTER — Ambulatory Visit (HOSPITAL_COMMUNITY): Payer: BC Managed Care – PPO | Admitting: Registered Nurse

## 2012-08-08 ENCOUNTER — Encounter (HOSPITAL_COMMUNITY): Payer: Self-pay | Admitting: Registered Nurse

## 2012-08-08 ENCOUNTER — Encounter (HOSPITAL_COMMUNITY): Payer: Self-pay | Admitting: *Deleted

## 2012-08-08 ENCOUNTER — Encounter (HOSPITAL_COMMUNITY)
Admission: RE | Disposition: A | Discharge status: Home / Self Care | Payer: Self-pay | Source: Ambulatory Visit | Attending: Urology

## 2012-08-08 ENCOUNTER — Ambulatory Visit (HOSPITAL_COMMUNITY)
Admission: RE | Admit: 2012-08-08 | Discharge: 2012-08-08 | Disposition: A | Discharge status: Home / Self Care | Payer: BC Managed Care – PPO | Source: Ambulatory Visit | Attending: Urology | Admitting: Urology

## 2012-08-08 DIAGNOSIS — N400 Enlarged prostate without lower urinary tract symptoms: Secondary | ICD-10-CM | POA: Insufficient documentation

## 2012-08-08 DIAGNOSIS — I252 Old myocardial infarction: Secondary | ICD-10-CM | POA: Insufficient documentation

## 2012-08-08 DIAGNOSIS — Z79899 Other long term (current) drug therapy: Secondary | ICD-10-CM | POA: Insufficient documentation

## 2012-08-08 DIAGNOSIS — N201 Calculus of ureter: Secondary | ICD-10-CM | POA: Insufficient documentation

## 2012-08-08 DIAGNOSIS — K219 Gastro-esophageal reflux disease without esophagitis: Secondary | ICD-10-CM | POA: Insufficient documentation

## 2012-08-08 DIAGNOSIS — N200 Calculus of kidney: Secondary | ICD-10-CM | POA: Insufficient documentation

## 2012-08-08 DIAGNOSIS — I714 Abdominal aortic aneurysm, without rupture, unspecified: Secondary | ICD-10-CM | POA: Insufficient documentation

## 2012-08-08 DIAGNOSIS — Z8551 Personal history of malignant neoplasm of bladder: Secondary | ICD-10-CM | POA: Insufficient documentation

## 2012-08-08 HISTORY — PX: CYSTOSCOPY/RETROGRADE/URETEROSCOPY/STONE EXTRACTION WITH BASKET: SHX5317

## 2012-08-08 SURGERY — CYSTOSCOPY, WITH CALCULUS REMOVAL USING BASKET
Anesthesia: General | Laterality: Left | Wound class: Clean Contaminated

## 2012-08-08 MED ORDER — ONDANSETRON HCL 4 MG/2ML IJ SOLN: 4.0000 mg | Freq: Four times a day (QID) | INTRAMUSCULAR | Status: DC | PRN

## 2012-08-08 MED ORDER — OXYCODONE HCL 5 MG/5ML PO SOLN
5.0000 mg | Freq: Once | ORAL | Status: DC | PRN
  Filled 2012-08-08: qty 5

## 2012-08-08 MED ORDER — CIPROFLOXACIN IN D5W 400 MG/200ML IV SOLN
INTRAVENOUS | Status: AC
  Filled 2012-08-08: qty 200

## 2012-08-08 MED ORDER — SODIUM CHLORIDE 0.9 % IV SOLN: 250.0000 mL | INTRAVENOUS | Status: DC | PRN

## 2012-08-08 MED ORDER — ONDANSETRON HCL 4 MG/2ML IJ SOLN
INTRAMUSCULAR | Status: DC | PRN
  Administered 2012-08-08: 4 mg via INTRAVENOUS

## 2012-08-08 MED ORDER — PROMETHAZINE HCL 25 MG/ML IJ SOLN: 6.2500 mg | INTRAMUSCULAR | Status: DC | PRN

## 2012-08-08 MED ORDER — HYDROMORPHONE HCL PF 1 MG/ML IJ SOLN: 0.2500 mg | INTRAMUSCULAR | Status: DC | PRN

## 2012-08-08 MED ORDER — MIDAZOLAM HCL 5 MG/5ML IJ SOLN
INTRAMUSCULAR | Status: DC | PRN
  Administered 2012-08-08: 2 mg via INTRAVENOUS

## 2012-08-08 MED ORDER — LACTATED RINGERS IV SOLN: INTRAVENOUS | Status: DC

## 2012-08-08 MED ORDER — OXYCODONE HCL 5 MG PO TABS
5.0000 mg | ORAL_TABLET | ORAL | Status: DC | PRN
  Administered 2012-08-08: 5 mg via ORAL

## 2012-08-08 MED ORDER — PROPOFOL 10 MG/ML IV EMUL
INTRAVENOUS | Status: DC | PRN
  Administered 2012-08-08: 200 mg via INTRAVENOUS

## 2012-08-08 MED ORDER — SODIUM CHLORIDE 0.9 % IR SOLN
Status: DC | PRN
  Administered 2012-08-08: 3000 mL via INTRAVESICAL

## 2012-08-08 MED ORDER — CIPROFLOXACIN IN D5W 400 MG/200ML IV SOLN
400.0000 mg | INTRAVENOUS | Status: AC
  Administered 2012-08-08: 400 mg via INTRAVENOUS

## 2012-08-08 MED ORDER — FENTANYL CITRATE 0.05 MG/ML IJ SOLN
INTRAMUSCULAR | Status: DC | PRN
  Administered 2012-08-08: 100 ug via INTRAVENOUS

## 2012-08-08 MED ORDER — SODIUM CHLORIDE 0.9 % IJ SOLN: 3.0000 mL | Freq: Two times a day (BID) | INTRAMUSCULAR | Status: DC

## 2012-08-08 MED ORDER — LACTATED RINGERS IV SOLN
INTRAVENOUS | Status: DC | PRN
  Administered 2012-08-08: 07:00:00 via INTRAVENOUS

## 2012-08-08 MED ORDER — ACETAMINOPHEN 10 MG/ML IV SOLN: 1000.0000 mg | Freq: Once | INTRAVENOUS | Status: DC | PRN

## 2012-08-08 MED ORDER — OXYCODONE HCL 5 MG PO TABS
5.0000 mg | ORAL_TABLET | Freq: Once | ORAL | Status: DC | PRN
Start: 2012-08-08 — End: 2012-08-08
  Filled 2012-08-08: qty 1

## 2012-08-08 MED ORDER — LIDOCAINE HCL (CARDIAC) 20 MG/ML IV SOLN
INTRAVENOUS | Status: DC | PRN
  Administered 2012-08-08: 80 mg via INTRAVENOUS

## 2012-08-08 MED ORDER — SODIUM CHLORIDE 0.9 % IJ SOLN: 3.0000 mL | INTRAMUSCULAR | Status: DC | PRN

## 2012-08-08 MED ORDER — MEPERIDINE HCL 50 MG/ML IJ SOLN: 6.2500 mg | INTRAMUSCULAR | Status: DC | PRN

## 2012-08-08 SURGICAL SUPPLY — 17 items
BAG URO CATCHER STRL LF (DRAPE) ×2 IMPLANT
BASKET LASER NITINOL 1.9FR (BASKET) ×2 IMPLANT
BASKET ZERO TIP NITINOL 2.4FR (BASKET) ×1 IMPLANT
BSKT STON RTRVL 120 1.9FR (BASKET) ×1
BSKT STON RTRVL ZERO TP 2.4FR (BASKET) ×1
CATH URET 5FR 28IN OPEN ENDED (CATHETERS) ×2 IMPLANT
CLOTH BEACON ORANGE TIMEOUT ST (SAFETY) ×2 IMPLANT
DRAPE CAMERA CLOSED 9X96 (DRAPES) ×2 IMPLANT
GLOVE SURG SS PI 8.0 STRL IVOR (GLOVE) ×2 IMPLANT
GOWN PREVENTION PLUS XLARGE (GOWN DISPOSABLE) ×2 IMPLANT
GOWN STRL REIN XL XLG (GOWN DISPOSABLE) ×2 IMPLANT
GUIDEWIRE STR DUAL SENSOR (WIRE) ×1 IMPLANT
LASER FIBER DISP (UROLOGICAL SUPPLIES) ×1 IMPLANT
MANIFOLD NEPTUNE II (INSTRUMENTS) ×2 IMPLANT
PACK CYSTO (CUSTOM PROCEDURE TRAY) ×2 IMPLANT
STENT CONTOUR 6FRX26X.038 (STENTS) ×1 IMPLANT
TUBING CONNECTING 10 (TUBING) ×2 IMPLANT

## 2012-08-08 NOTE — Anesthesia Postprocedure Evaluation (Signed)
Anesthesia Post Note  Patient: Frederick Miles  Procedure(s) Performed: Procedure(s) (LRB): CYSTOSCOPY/RETROGRADE/URETEROSCOPY/STONE EXTRACTION WITH BASKET (Left)  Anesthesia type: General  Patient location: PACU  Post pain: Pain level controlled  Post assessment: Post-op Vital signs reviewed  Last Vitals: BP 144/87  Pulse 71  Temp 36.6 C (Oral)  Resp 18  SpO2 99%  Post vital signs: Reviewed  Level of consciousness: sedated  Complications: No apparent anesthesia complications

## 2012-08-08 NOTE — Op Note (Signed)
NAMEJODI, Frederick Miles NO.:  [id number]  MEDICAL RECORD NO.:  [id number]  LOCATION:  [facility]                         FACILITY:  [facility]  PHYSICIAN:  Excell Seltzer. Annabell Howells, M.D.    DATE OF BIRTH:  06-16-1946  DATE OF PROCEDURE:  08/08/2012 DATE OF DISCHARGE:                              OPERATIVE REPORT   PROCEDURE:  Left ureteroscopic stone extraction and placement of left double-J stent.  PREOPERATIVE DIAGNOSIS:  Left proximal ureteral stone.  POSTOPERATIVE DIAGNOSIS:  Left proximal ureteral stone.  SURGEON:  Excell Seltzer. Annabell Howells, MD  ANESTHESIA:  General.  SPECIMEN:  Stone.  DRAINS:  A 6-French 26-cm double-J stent.  COMPLICATIONS:  None.  INDICATIONS:  [name] is a 66 year old white male with recent presentation with obstructing left proximal ureteral stone.  He underwent stenting on Monday and returns today for definitive ureteroscopic stone extraction.  FINDINGS OF PROCEDURE:  He was taken to the operating room where he was given Cipro.  A general anesthetic was induced.  He was placed in lithotomy position.  His perineum and genitalia were prepped with Betadine solution and he was draped in usual sterile fashion.  Cystoscopy was performed using a 22-French scope and 12-degree lens. Examination revealed a normal urethra.  The external sphincter was intact.  The prostatic urethra was approximately 2 cm in length with trilobar hyperplasia with a small middle lobe.  The bladder was evaluated at his previous cystoscopy.  There was a stent exiting the left ureter with some edema around the orifice.  The stent was grasped with a grasping forceps and pulled the urethral meatus.  A guidewire was then passed to the kidney and through the stent and the stent was removed.  A 6.5-French short ureteroscope was then inserted per urethra and advanced alongside the wire to the level of the stone which was in the lower proximal ureter.  It was at the upper extent of the  reach of the short ureteroscope, however, was able to see the stone.  It was then engaged with a 365 micron holmium laser fiber set on 0.5 watts and 20 Hz.  The stone was fragmented into manageable fragments, grasped, and then the fragments were removed using a ZeroTip nitinol basket.  After removal of all significant fragments, inspection revealed some mucosal irritation from the procedure and a small amount of blood clot. It was felt that replacement of the stent was indicated.  The ureteroscope was removed.  The cystoscope was reinserted over the wire and a 6-French 26-cm double-J stent with string was inserted to the kidney under fluoroscopic guidance.  The wire was removed leaving good coil in the kidney, a good coil in the bladder.  The stone fragments were then evacuated from the bladder.  The cystoscope was removed after draining the bladder.  The stent string was left exiting the urethra, and the position of the stent was confirmed at the completion of the procedure.  At this point, the stent string was secured to the patient's penis.  He was taken down from lithotomy position.  His anesthetic was reversed.  He was moved to recovery room in stable condition.  There were no complications.  Excell Seltzer. Annabell Howells, M.D.     JJW/MEDQ  D:  08/08/2012  T:  08/08/2012  Job:  [id number]

## 2012-08-08 NOTE — Interval H&P Note (Signed)
History and Physical Interval Note:  08/08/2012 7:06 AM  Frederick Miles  has presented today for surgery, with the diagnosis of Left Proximal Stone  The various methods of treatment have been discussed with the patient and family. After consideration of risks, benefits and other options for treatment, the patient has consented to  Procedure(s) (LRB): CYSTOSCOPY/RETROGRADE/URETEROSCOPY/STONE EXTRACTION WITH BASKET (Left) as a surgical intervention .  The patient's history has been reviewed, patient examined, no change in status, stable for surgery.  I have reviewed the patient's chart and labs.  Questions were answered to the patient's satisfaction.     Lex Linhares J

## 2012-08-08 NOTE — Brief Op Note (Signed)
08/08/2012  8:09 AM  PATIENT:  Frederick Miles  66 y.o. male  PRE-OPERATIVE DIAGNOSIS:  Left Proximal Stone  POST-OPERATIVE DIAGNOSIS:  Left Proximal Stone  PROCEDURE:  Procedure(s) (LRB): CYSTOSCOPY/RETROGRADE/URETEROSCOPY/STONE EXTRACTION WITH BASKET (Left)  SURGEON:  Surgeon(s) and Role:    * Anner Crete, MD - Primary  PHYSICIAN ASSISTANT:   ASSISTANTS: none   ANESTHESIA:   general  EBL:     BLOOD ADMINISTERED:none  DRAINS: Left JJ stent   LOCAL MEDICATIONS USED:  NONE  SPECIMEN:  Source of Specimen:  stone from left ureter  DISPOSITION OF SPECIMEN:  to family to bring to office  COUNTS:  YES  TOURNIQUET:  * No tourniquets in log *  DICTATION: .Other Dictation: Dictation Number [id number]  PLAN OF CARE: Discharge to home after PACU  PATIENT DISPOSITION:  PACU - hemodynamically stable.   Delay start of Pharmacological VTE agent (>24hrs) due to surgical blood loss or risk of bleeding: no

## 2012-08-08 NOTE — Anesthesia Preprocedure Evaluation (Addendum)
Anesthesia Evaluation  Patient identified by MRN, date of birth, ID band Patient awake    Reviewed: Allergy & Precautions, H&P , NPO status , Patient's Chart, lab work & pertinent test results  Airway Mallampati: III TM Distance: >3 FB Neck ROM: Full    Dental  (+) Teeth Intact and Dental Advisory Given   Pulmonary neg pulmonary ROS,  breath sounds clear to auscultation  Pulmonary exam normal       Cardiovascular hypertension, + CAD and + Past MI + Valvular Problems/Murmurs Rhythm:Regular Rate:Normal + Systolic murmurs    Neuro/Psych negative neurological ROS     GI/Hepatic Neg liver ROS, GERD-  Controlled,  Endo/Other  Hypothyroidism   Renal/GU      Musculoskeletal negative musculoskeletal ROS (+)   Abdominal (+)  Abdomen: soft.    Peds  Hematology negative hematology ROS (+)   Anesthesia Other Findings   Reproductive/Obstetrics                         Anesthesia Physical Anesthesia Plan  ASA: III  Anesthesia Plan: General   Post-op Pain Management:    Induction: Intravenous  Airway Management Planned: LMA  Additional Equipment:   Intra-op Plan:   Post-operative Plan: Extubation in OR  Informed Consent: I have reviewed the patients History and Physical, chart, labs and discussed the procedure including the risks, benefits and alternatives for the proposed anesthesia with the patient or authorized representative who has indicated his/her understanding and acceptance.   Dental advisory given  Plan Discussed with: CRNA and Surgeon  Anesthesia Plan Comments:        Anesthesia Quick Evaluation                                  Anesthesia Evaluation  Patient identified by MRN, date of birth, ID band Patient awake    Reviewed: Allergy & Precautions, H&P , NPO status , Patient's Chart, lab work & pertinent test results  Airway Mallampati: III TM Distance: >3 FB Neck  ROM: Full    Dental  (+) Teeth Intact and Dental Advisory Given   Pulmonary neg pulmonary ROS,  breath sounds clear to auscultation  Pulmonary exam normal       Cardiovascular hypertension, Pt. on medications + CAD, + Past MI and + Cardiac Stents + Valvular Problems/Murmurs Rhythm:Regular Rate:Normal     Neuro/Psych negative neurological ROS  negative psych ROS   GI/Hepatic Neg liver ROS, GERD-  Medicated,  Endo/Other  Hypothyroidism   Renal/GU negative Renal ROS  negative genitourinary   Musculoskeletal negative musculoskeletal ROS (+)   Abdominal   Peds negative pediatric ROS (+)  Hematology negative hematology ROS (+)   Anesthesia Other Findings   Reproductive/Obstetrics negative OB ROS                          Anesthesia Physical Anesthesia Plan  ASA: III  Anesthesia Plan: General   Post-op Pain Management:    Induction: Intravenous  Airway Management Planned: LMA  Additional Equipment:   Intra-op Plan:   Post-operative Plan: Extubation in OR  Informed Consent: I have reviewed the patients History and Physical, chart, labs and discussed the procedure including the risks, benefits and alternatives for the proposed anesthesia with the patient or authorized representative who has indicated his/her understanding and acceptance.   Dental advisory given  Plan Discussed with: CRNA  Anesthesia Plan Comments:         Anesthesia Quick Evaluation

## 2012-08-08 NOTE — Progress Notes (Signed)
Spoke to Dr. Annabell Howells to clarify when Mr. Frankson can resume his plavix and ASA 325mg .  Dr. Annabell Howells verbalized to resume his Plavix and ASA after his stent is removed.

## 2012-08-08 NOTE — Progress Notes (Signed)
Pt is having minimal drippling/incontinence, pt sent home with urinal.

## 2012-08-08 NOTE — H&P (View-Only) (Signed)
ms Problems  1. 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 Microscopic Hematuria 599.72 5. Mid Ureteral Stone On The Left 592.1 6. Nephrolithiasis 592.0 7. 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  He was seen on Friday and has a 6mm stone in the proximal ureter.   He has continued since Friday and the position moved some but he is hurting the left flank again.   The pain is intermittantly severe but controlled with vicodin.  He has had hematuria.  He has had no nausea.   Past Medical History Problems  1. History of  Acute Myocardial Infarction V12.59 2. 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 History of  Cath Stent Placement 4. History of  Esophageal Reflux 530.81 5. 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 History of  Microscopic Hematuria 599.72  Surgical History Problems  1. History of  Cystoscopy With Fulguration Small Lesion (5-20mm) 2. History of  Heart Surgery  Current Meds 1. Aspirin 325 MG Oral Tablet; Therapy: (Recorded:09Dec2008) to 2. Hydrocodone-Acetaminophen 7.5-650 MG Oral Tablet; TAKE 1 TABLET EVERY 4 HOURS AS  NEEDED FOR PAIN; Therapy: 02Aug2013 to (Evaluate:16Aug2013); Last Rx:02Aug2013 3. Lipitor 80 MG Oral Tablet; Therapy: 13Dec2007 to 4. Lisinopril 20 MG Oral Tablet; Therapy: 22Oct2012 to 5. Nitrostat 0.4 MG Sublingual Tablet Sublingual; Therapy: 13Dec2012 to 6. Plavix 75 MG Oral Tablet; Therapy: 13Dec2007 to 7. PriLOSEC OTC 20 MG Oral Tablet Delayed Release; Therapy: (Recorded:01Aug2008) to 8. Synthroid 125 MCG Oral Tablet; Therapy: 16Oct2007 to 9. Tamsulosin HCl 0.4 MG Oral Capsule; TAKE 1 CAPSULE Bedtime; Therapy: 02Aug2013 to  (Evaluate:28Jul2014)  Requested for: 02Aug2013; Last Rx:02Aug2013  Allergies Medication  1. No  Known Drug Allergies  Family History Problems  1. Family history of  Family Health Status Number Of Children 2 sons 2. Family history of  Prostate Cancer V16.42  Social History Problems  1. Caffeine Use 2 coffee 2. Marital History - Currently Married 3. History of  Tobacco Use V15.82 quit 20 years ago, smoked for 10 years  Review of Systems  Gastrointestinal: nausea.  Constitutional: no fever.  Cardiovascular: no chest pain.  Respiratory: no shortness of breath.    Vitals Vital Signs [Data Includes: Last 1 Day]  05Aug2013 08:12AM  Blood Pressure: 134 / 79 Temperature: 98.2 F Heart Rate: 76  Physical Exam Constitutional: Well nourished and well developed . No acute distress.  Pulmonary: No respiratory distress and normal respiratory rhythm and effort.  Cardiovascular: Heart rate and rhythm are normal . No peripheral edema.  Abdomen: The abdomen is soft and nontender (except for the LCVAT). mild left CVA tenderness.    Results/Data Urine [Data Includes: Last 1 Day]   05Aug2013  COLOR YELLOW   APPEARANCE CLEAR   SPECIFIC GRAVITY 1.025   pH 5.5   GLUCOSE NEG mg/dL  BILIRUBIN NEG   KETONE NEG mg/dL  BLOOD NEG   PROTEIN NEG mg/dL  UROBILINOGEN 1 mg/dL  NITRITE NEG   LEUKOCYTE ESTERASE NEG    The following images/tracing/specimen were independently visualized:  KUB shows a 6x8mm Left proximal stone adjacent to the maximal Aortic calcification. There is a right renal stone. No other abnormalities are noted.    Assessment Assessed  1. Mid Ureteral Stone On The Left 592.1   He has a symptomatic left ureteral stone that   is adjacent to his calcified aneurysm and he is on Plavix and ASA.   Plan Health Maintenance (V70.0)  1. UA With REFLEX  Done: 05Aug2013 07:59AM   I am going to set him up for a stent today and then he will need ureteroscopy at a later date.  I don't think I would want to do ESWL with the stone next to the calcified aneurysm. I reviewed the risks  of bleeding, infection, ureteral injury, thrombotic events and anesthetic complications.   

## 2012-08-08 NOTE — Preoperative (Signed)
Beta Blockers   Reason not to administer Beta Blockers:Not Applicable 

## 2012-08-08 NOTE — Transfer of Care (Signed)
Immediate Anesthesia Transfer of Care Note  Patient: Frederick Miles  Procedure(s) Performed: Procedure(s) (LRB): CYSTOSCOPY/RETROGRADE/URETEROSCOPY/STONE EXTRACTION WITH BASKET (Left)  Patient Location: PACU  Anesthesia Type: General  Level of Consciousness: awake, alert , oriented and patient cooperative  Airway & Oxygen Therapy: Patient Spontanous Breathing and Patient connected to face mask oxygen  Post-op Assessment: Report given to PACU RN, Post -op Vital signs reviewed and stable and Patient moving all extremities  Post vital signs: Reviewed and stable  Complications: No apparent anesthesia complications

## 2012-08-11 ENCOUNTER — Encounter (HOSPITAL_COMMUNITY): Payer: Self-pay | Admitting: Urology

## 2012-12-02 ENCOUNTER — Encounter: Payer: Self-pay | Admitting: *Deleted

## 2012-12-03 ENCOUNTER — Ambulatory Visit (INDEPENDENT_AMBULATORY_CARE_PROVIDER_SITE_OTHER): Payer: BC Managed Care – PPO | Admitting: Cardiology

## 2012-12-03 ENCOUNTER — Encounter: Payer: Self-pay | Admitting: Cardiology

## 2012-12-03 VITALS — BP 142/92 | HR 68 | Ht 71.0 in | Wt 201.0 lb

## 2012-12-03 DIAGNOSIS — I2510 Atherosclerotic heart disease of native coronary artery without angina pectoris: Secondary | ICD-10-CM

## 2012-12-03 DIAGNOSIS — E785 Hyperlipidemia, unspecified: Secondary | ICD-10-CM

## 2012-12-03 DIAGNOSIS — I10 Essential (primary) hypertension: Secondary | ICD-10-CM

## 2012-12-03 DIAGNOSIS — R011 Cardiac murmur, unspecified: Secondary | ICD-10-CM

## 2012-12-03 MED ORDER — NITROGLYCERIN 0.4 MG SL SUBL: 0.4000 mg | SUBLINGUAL_TABLET | SUBLINGUAL | Status: DC | PRN

## 2012-12-03 NOTE — Patient Instructions (Addendum)
Your physician recommends that you continue on your current medications as directed. Please refer to the Current Medication list given to you today.   Your physician wants you to follow-up in: 1 year with Dr. Wall. You will receive a reminder letter in the mail two months in advance. If you don't receive a letter, please call our office to schedule the follow-up appointment.  

## 2012-12-03 NOTE — Progress Notes (Signed)
HPI [name] returns today for evaluation and management artery disease and history of PCI. He's doing remarkably well without any symptoms of angina or ischemia. He remains very active playing golf and has started to do some upland hunting.  He is very compliant with his medications. Last lipid profile by primary care 11/11/2012 showed a total cholesterol of 114 and LDL 62. He has a chronic low HDL.  He has a history of hyperglycemia but his last hemoglobin A1c was 5.1%. Past Medical History  Diagnosis Date  . Coronary atherosclerosis of native coronary artery   . Hyperlipidemia   . Cervicalgia   . Unspecified hypothyroidism   . Esophageal reflux   . Hypertension   . Hx of adenomatous colonic polyps   . Esophageal stricture   . Myocardial infarct   . Heart murmur   . Bladder cancer 2006  . Kidney stone on left side     Current Outpatient Prescriptions  Medication Sig Dispense Refill  . aspirin 325 MG tablet Take 325 mg by mouth daily.        Marland Kitchen atorvastatin (LIPITOR) 40 MG tablet Take 40 mg by mouth daily.        . clopidogrel (PLAVIX) 75 MG tablet Take 75 mg by mouth daily.        Marland Kitchen levothyroxine (SYNTHROID, LEVOTHROID) 125 MCG tablet Take 125 mcg by mouth daily.        Marland Kitchen lisinopril (PRINIVIL,ZESTRIL) 20 MG tablet Take 20 mg by mouth daily.        . naproxen sodium (ANAPROX) 220 MG tablet Take 220 mg by mouth 2 (two) times daily with a meal.      . nitroGLYCERIN (NITROSTAT) 0.4 MG SL tablet Place 1 tablet (0.4 mg total) under the tongue every 5 (five) minutes as needed for chest pain.  25 tablet  6  . omeprazole (PRILOSEC) 20 MG capsule Take 20 mg by mouth daily.          No Known Allergies  Family History  Problem Relation Age of Onset  . Heart disease Father   . Colon cancer Neg Hx   . Heart disease Mother     History   Social History  . Marital Status: Married    Spouse Name: N/A    Number of Children: 2  . Years of Education: N/A   Occupational History  .   [employer]   Social History Main Topics  . Smoking status: Former Smoker    Types: Cigarettes    Quit date: 08/05/1979  . Smokeless tobacco: Never Used  . Alcohol Use: 3.0 oz/week    5 Glasses of wine per week  . Drug Use: No  . Sexually Active: Yes   Other Topics Concern  . Not on file   Social History Narrative   Daily caffeine     ROS ALL NEGATIVE EXCEPT THOSE NOTED IN HPI  PE  General Appearance: well developed, well nourished in no acute distress HEENT: symmetrical face, PERRLA, good dentition  Neck: no JVD, thyromegaly, or adenopathy, trachea midline Chest: symmetric without deformity Cardiac: PMI non-displaced, RRR, normal S1, S2, no gallop or murmur Lung: clear to ausculation and percussion Vascular: all pulses full without bruits  Abdominal: nondistended, nontender, good bowel sounds, no HSM, no bruits Extremities: no cyanosis, clubbing or edema, no sign of DVT, no varicosities  Skin: normal color, no rashes Neuro: alert and oriented x 3, non-focal Pysch: normal affect  EKG  Last EKG by Dr.  Timothy Lasso showed normal sinus rhythm with LVH. I do not have a copy of this. We decided not to repeat.  BMET    Component Value Date/Time   NA 139 08/06/2012 1150   K 4.4 08/06/2012 1150   CL 102 08/06/2012 1150   CO2 28 08/06/2012 1150   GLUCOSE 87 08/06/2012 1150   BUN 19 08/06/2012 1150   CREATININE 1.10 08/06/2012 1150   CALCIUM 9.0 08/06/2012 1150   GFRNONAA 69* 08/06/2012 1150   GFRAA 80* 08/06/2012 1150    Lipid Panel     Component Value Date/Time   CHOL 123 03/03/2008 0948   TRIG 150* 03/03/2008 0948   HDL 27.6* 03/03/2008 0948   CHOLHDL 4.5 CALC 03/03/2008 0948   VLDL 30 03/03/2008 0948   LDLCALC 65 03/03/2008 0948    CBC    Component Value Date/Time   WBC 8.3 08/06/2012 1150   RBC 4.28 08/06/2012 1150   HGB 13.7 08/06/2012 1150   HCT 38.8* 08/06/2012 1150   PLT 183 08/06/2012 1150   MCV 90.7 08/06/2012 1150   MCH 32.0 08/06/2012 1150   MCHC 35.3 08/06/2012 1150   RDW 12.9  08/06/2012 1150

## 2012-12-03 NOTE — Assessment & Plan Note (Signed)
Stable. Continue secondary preventative therapy. We'll continue dual antiplatelet therapy. Return the office in one year.

## 2013-06-05 ENCOUNTER — Encounter: Payer: Self-pay | Admitting: Cardiology

## 2013-12-02 ENCOUNTER — Encounter: Payer: Self-pay | Admitting: Cardiology

## 2013-12-02 ENCOUNTER — Ambulatory Visit (INDEPENDENT_AMBULATORY_CARE_PROVIDER_SITE_OTHER): Payer: BC Managed Care – PPO | Admitting: Cardiology

## 2013-12-02 VITALS — BP 130/66 | HR 55 | Ht 71.0 in | Wt 208.0 lb

## 2013-12-02 DIAGNOSIS — I2510 Atherosclerotic heart disease of native coronary artery without angina pectoris: Secondary | ICD-10-CM

## 2013-12-02 DIAGNOSIS — I10 Essential (primary) hypertension: Secondary | ICD-10-CM

## 2013-12-02 DIAGNOSIS — E785 Hyperlipidemia, unspecified: Secondary | ICD-10-CM

## 2013-12-02 NOTE — Patient Instructions (Signed)
Decrease aspirin to 81mg  daily.   Your physician wants you to follow-up in: 1 year with Dr Shirlee Latch. (December 2015).  You will receive a reminder letter in the mail two months in advance. If you don't receive a letter, please call our office to schedule the follow-up appointment.

## 2013-12-02 NOTE — Progress Notes (Signed)
Patient ID: Frederick Miles, male   DOB: 05/21/1946, 67 y.o.   MRN: [id number] PCP: Dr. Timothy Lasso  67 yo with history of CAD s/p NSTEMI in 12/07 with DES to LAD.  He has done well since that time.  He was seen by Dr. Daleen Squibb in the past and is seen by me for the first time today.  I reviewed all his old records today. He works out at a gym three times a week and jogs on most days.  He plays golf regularly.  No exertional dyspnea or chest pain.  BP has been under good control.    ECG: NSR, normal  Labs (6/14): K 4.3, creatinine 1.1, LDL 58, HDL 23  PMH: 1. Hyperlipidemia 2. CAD: NSTEMI in 12/07 with DES to LAD, also had 50% stenosis in D1.  EF was preserved.  10/08 had Cardiolite with no ischemia.  3. HTN 4. GERD 5. Colon polyps 6. Bladder cancer in 2006 7. Nephrolithiasis  FH: CAD father and half-brother, grandfather with MI at 2.  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   ROS: All systems reviewed and negative except as per HPI.   Current Outpatient Prescriptions  Medication Sig Dispense Refill  . atorvastatin (LIPITOR) 40 MG tablet Take 40 mg by mouth daily.        . clopidogrel (PLAVIX) 75 MG tablet Take 75 mg by mouth daily.        Marland Kitchen levothyroxine (SYNTHROID, LEVOTHROID) 125 MCG tablet Take 125 mcg by mouth daily.        Marland Kitchen lisinopril (PRINIVIL,ZESTRIL) 20 MG tablet Take 20 mg by mouth daily.        . naproxen sodium (ANAPROX) 220 MG tablet Take 220 mg by mouth 2 (two) times daily with a meal.      . nitroGLYCERIN (NITROSTAT) 0.4 MG SL tablet Place 1 tablet (0.4 mg total) under the tongue every 5 (five) minutes as needed for chest pain.  25 tablet  6  . omeprazole (PRILOSEC) 20 MG capsule Take 20 mg by mouth daily.        Marland Kitchen aspirin EC 81 MG tablet Take 1 tablet (81 mg total) by mouth daily.       No current facility-administered medications for this visit.    BP 130/66  Pulse 55  Ht 5\' 11"  (1.803 m)  Wt 208 lb (94.348 kg)   BMI 29.02 kg/m2 General: NAD Neck: No JVD, no thyromegaly or thyroid nodule.  Lungs: Clear to auscultation bilaterally with normal respiratory effort. CV: Nondisplaced PMI.  Heart regular S1/S2, no S3/S4, no murmur.  No peripheral edema.  No carotid bruit.  Normal pedal pulses.  Abdomen: Soft, nontender, no hepatosplenomegaly, no distention.  Skin: Intact without lesions or rashes.  Neurologic: Alert and oriented x 3.  Psych: Normal affect. Extremities: No clubbing or cyanosis.   Assessment/Plan: 1. CAD: NSTEMI in 12/07 with DES to LAD.  No bleeding problems, so will plan to continue Plavix long-term.  He can decrease ASA to 81 mg daily.  Continue statin and lisinopril.   2. Hyperlipidemia: Good lipids in 6/14.  3. HTN: BP is under good control.  Marca Ancona 12/02/2013

## 2013-12-07 ENCOUNTER — Encounter: Payer: BC Managed Care – PPO | Admitting: Cardiology

## 2014-12-03 ENCOUNTER — Ambulatory Visit (INDEPENDENT_AMBULATORY_CARE_PROVIDER_SITE_OTHER): Payer: BC Managed Care – PPO | Admitting: Cardiology

## 2014-12-03 ENCOUNTER — Encounter: Payer: Self-pay | Admitting: *Deleted

## 2014-12-03 ENCOUNTER — Encounter: Payer: Self-pay | Admitting: Cardiology

## 2014-12-03 VITALS — BP 148/100 | HR 63 | Ht 70.0 in | Wt 206.0 lb

## 2014-12-03 DIAGNOSIS — R011 Cardiac murmur, unspecified: Secondary | ICD-10-CM

## 2014-12-03 DIAGNOSIS — I2510 Atherosclerotic heart disease of native coronary artery without angina pectoris: Secondary | ICD-10-CM

## 2014-12-03 DIAGNOSIS — R010 Benign and innocent cardiac murmurs: Secondary | ICD-10-CM

## 2014-12-03 DIAGNOSIS — I10 Essential (primary) hypertension: Secondary | ICD-10-CM

## 2014-12-03 NOTE — Patient Instructions (Addendum)
Your physician has requested that you have an echocardiogram. Echocardiography is a painless test that uses sound waves to create images of your heart. It provides your doctor with information about the size and shape of your heart and how well your heart's chambers and valves are working. This procedure takes approximately one hour. There are no restrictions for this procedure.  Your physician has requested that you regularly monitor and record your blood pressure readings at home. Please use the same machine at the same time of day to check your readings and record them. I will call you in about 2 weeks to get the readings. Eliot Ford   Your physician wants you to follow-up in: 1 year with Dr Aundra Dubin. (December 2016). You will receive a reminder letter in the mail two months in advance. If you don't receive a letter, please call our office to schedule the follow-up appointment.

## 2014-12-05 NOTE — Progress Notes (Signed)
Patient ID: Frederick Miles, male   DOB: 04-04-1946, 68 y.o.   MRN: [id number] PCP: Dr. Virgina Jock  68 yo with history of CAD s/p NSTEMI in 12/07 with DES to LAD.  He has done well since that time.  He is active and does a lot of walking at work.  He plays golf regularly.  No exertional dyspnea or chest pain.  BP is high today.  He has not had it checked recently other than today.      ECG: NSR, normal  Labs (6/14): K 4.3, creatinine 1.1, LDL 58, HDL 23  PMH: 1. Hyperlipidemia 2. CAD: NSTEMI in 12/07 with DES to LAD, also had 50% stenosis in D1.  EF was preserved.  10/08 had Cardiolite with no ischemia.  3. HTN 4. GERD 5. Colon polyps 6. Bladder cancer in 2006 7. Nephrolithiasis 8. H/o rheumatic fever  FH: CAD father and half-brother, grandfather with MI at 66.  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   ROS: All systems reviewed and negative except as per HPI.   Current Outpatient Prescriptions  Medication Sig Dispense Refill  . aspirin EC 81 MG tablet Take 1 tablet (81 mg total) by mouth daily.    Marland Kitchen atorvastatin (LIPITOR) 40 MG tablet Take 40 mg by mouth daily.      . clopidogrel (PLAVIX) 75 MG tablet Take 75 mg by mouth daily.      Marland Kitchen esomeprazole (NEXIUM) 40 MG capsule Take 40 mg by mouth daily at 12 noon.    Marland Kitchen levothyroxine (SYNTHROID, LEVOTHROID) 125 MCG tablet Take 125 mcg by mouth daily.      Marland Kitchen omeprazole (PRILOSEC) 20 MG capsule Take 20 mg by mouth daily.      Marland Kitchen lisinopril (PRINIVIL,ZESTRIL) 40 MG tablet Take 40 mg by mouth daily.  11  . nitroGLYCERIN (NITROSTAT) 0.4 MG SL tablet Place 1 tablet (0.4 mg total) under the tongue every 5 (five) minutes as needed for chest pain. 25 tablet 6   No current facility-administered medications for this visit.    BP 148/100 mmHg  Pulse 63  Ht 5\' 10"  (1.778 m)  Wt 206 lb (93.441 kg)  BMI 29.56 kg/m2 General: NAD Neck: No JVD, no thyromegaly or thyroid nodule.  Lungs: Clear to  auscultation bilaterally with normal respiratory effort. CV: Nondisplaced PMI.  Heart regular S1/S2, no S3/S4, 2/6 early SEM RUSB.  No peripheral edema.  No carotid bruit.  Normal pedal pulses.  Abdomen: Soft, nontender, no hepatosplenomegaly, no distention.  Skin: Intact without lesions or rashes.  Neurologic: Alert and oriented x 3.  Psych: Normal affect. Extremities: No clubbing or cyanosis.   Assessment/Plan: 1. CAD: NSTEMI in 12/07 with DES to LAD.  No bleeding problems, so will plan to continue Plavix long-term along with ASA 81.  Continue statin and lisinopril.   2. Hyperlipidemia: Will call PCP for copy of most recent lipid profile.  3. HTN: BP high.  He is going to get a BP cuff, and we will call him for readings in 2 wks.  4. Murmur: Systolic murmur with history of rheumatic fever as a child.  I will get an echocardiogram.   Loralie Champagne 12/05/2014

## 2014-12-07 ENCOUNTER — Ambulatory Visit (HOSPITAL_COMMUNITY): Payer: BC Managed Care – PPO | Attending: Cardiology | Admitting: Cardiology

## 2014-12-07 DIAGNOSIS — R011 Cardiac murmur, unspecified: Secondary | ICD-10-CM | POA: Diagnosis present

## 2014-12-07 NOTE — Progress Notes (Signed)
Echo performed. 

## 2014-12-15 ENCOUNTER — Telehealth: Payer: Self-pay | Admitting: *Deleted

## 2014-12-15 DIAGNOSIS — I10 Essential (primary) hypertension: Secondary | ICD-10-CM

## 2014-12-15 NOTE — Telephone Encounter (Signed)
Copied from Dr Claris Gladden 12/03/14 office note:  HTN: BP high. He is going to get a BP cuff, and we will call him for readings in 2 wks.   LMTCB

## 2014-12-16 NOTE — Telephone Encounter (Signed)
Pt states he will fax a log of his BP readings since office visit with Dr Aundra Dubin 12/03/14.

## 2014-12-16 NOTE — Telephone Encounter (Signed)
F/U    Pt returning call, may be reached at [phone].

## 2014-12-20 MED ORDER — HYDROCHLOROTHIAZIDE 25 MG PO TABS: 25.0000 mg | ORAL_TABLET | Freq: Every day | ORAL | Status: DC

## 2014-12-20 NOTE — Telephone Encounter (Signed)
Dr Aundra Dubin reviewed BP readings sent in by patient-  Dr Aundra Dubin recommended strt HCTZ 25mg  daily with BMET in 10 days. BP check after 2 weeks on HCTZ.  Pt advised, verbalized understanding.

## 2014-12-21 ENCOUNTER — Telehealth: Payer: Self-pay | Admitting: Cardiology

## 2014-12-21 NOTE — Telephone Encounter (Signed)
New Msg    Pt calling about medications, states there has been a change and he has some questions. Please call.

## 2014-12-21 NOTE — Telephone Encounter (Signed)
Answered pt's questions about medications.

## 2015-01-03 ENCOUNTER — Other Ambulatory Visit (INDEPENDENT_AMBULATORY_CARE_PROVIDER_SITE_OTHER): Payer: BC Managed Care – PPO | Admitting: *Deleted

## 2015-01-03 DIAGNOSIS — I10 Essential (primary) hypertension: Secondary | ICD-10-CM

## 2015-01-03 LAB — BASIC METABOLIC PANEL
BUN: 26 mg/dL — ABNORMAL HIGH (ref 6–23)
CO2: 25 mEq/L (ref 19–32)
Calcium: 8.7 mg/dL (ref 8.4–10.5)
Chloride: 101 mEq/L (ref 96–112)
Creatinine, Ser: 1.3 mg/dL (ref 0.4–1.5)
GFR: 60.07 mL/min (ref >60.00)
Glucose, Bld: 120 mg/dL — ABNORMAL HIGH (ref 70–99)
Potassium: 3.8 mEq/L (ref 3.5–5.1)
Sodium: 138 mEq/L (ref 135–145)

## 2015-06-25 ENCOUNTER — Emergency Department (HOSPITAL_COMMUNITY): Payer: BC Managed Care – PPO

## 2015-06-25 ENCOUNTER — Encounter (HOSPITAL_COMMUNITY): Payer: Self-pay

## 2015-06-25 ENCOUNTER — Emergency Department (HOSPITAL_COMMUNITY)
Admission: EM | Admit: 2015-06-25 | Discharge: 2015-06-26 | Disposition: A | Discharge status: Home / Self Care | Payer: BC Managed Care – PPO | Attending: Emergency Medicine | Admitting: Emergency Medicine

## 2015-06-25 DIAGNOSIS — I2510 Atherosclerotic heart disease of native coronary artery without angina pectoris: Secondary | ICD-10-CM | POA: Diagnosis not present

## 2015-06-25 DIAGNOSIS — R079 Chest pain, unspecified: Secondary | ICD-10-CM | POA: Diagnosis present

## 2015-06-25 DIAGNOSIS — I10 Essential (primary) hypertension: Secondary | ICD-10-CM | POA: Insufficient documentation

## 2015-06-25 DIAGNOSIS — E039 Hypothyroidism, unspecified: Secondary | ICD-10-CM | POA: Diagnosis not present

## 2015-06-25 DIAGNOSIS — K219 Gastro-esophageal reflux disease without esophagitis: Secondary | ICD-10-CM | POA: Diagnosis not present

## 2015-06-25 DIAGNOSIS — R011 Cardiac murmur, unspecified: Secondary | ICD-10-CM | POA: Diagnosis not present

## 2015-06-25 DIAGNOSIS — Z9861 Coronary angioplasty status: Secondary | ICD-10-CM | POA: Diagnosis not present

## 2015-06-25 DIAGNOSIS — Z8551 Personal history of malignant neoplasm of bladder: Secondary | ICD-10-CM | POA: Diagnosis not present

## 2015-06-25 DIAGNOSIS — I480 Paroxysmal atrial fibrillation: Secondary | ICD-10-CM | POA: Insufficient documentation

## 2015-06-25 DIAGNOSIS — Z87442 Personal history of urinary calculi: Secondary | ICD-10-CM | POA: Diagnosis not present

## 2015-06-25 DIAGNOSIS — Z87891 Personal history of nicotine dependence: Secondary | ICD-10-CM | POA: Diagnosis not present

## 2015-06-25 DIAGNOSIS — E785 Hyperlipidemia, unspecified: Secondary | ICD-10-CM | POA: Insufficient documentation

## 2015-06-25 DIAGNOSIS — Z7982 Long term (current) use of aspirin: Secondary | ICD-10-CM | POA: Insufficient documentation

## 2015-06-25 DIAGNOSIS — I252 Old myocardial infarction: Secondary | ICD-10-CM | POA: Insufficient documentation

## 2015-06-25 DIAGNOSIS — Z79899 Other long term (current) drug therapy: Secondary | ICD-10-CM | POA: Diagnosis not present

## 2015-06-25 DIAGNOSIS — R0789 Other chest pain: Secondary | ICD-10-CM

## 2015-06-25 DIAGNOSIS — Z86010 Personal history of colonic polyps: Secondary | ICD-10-CM | POA: Insufficient documentation

## 2015-06-25 LAB — BASIC METABOLIC PANEL
Anion gap: 11 (ref 5–15)
BUN: 28 mg/dL — ABNORMAL HIGH (ref 6–20)
CO2: 22 mmol/L (ref 22–32)
Calcium: 9 mg/dL (ref 8.9–10.3)
Chloride: 105 mmol/L (ref 101–111)
Creatinine, Ser: 1.25 mg/dL — ABNORMAL HIGH (ref 0.61–1.24)
GFR calc Af Amer: >60 mL/min (ref >60)
GFR calc non Af Amer: 58 mL/min — ABNORMAL LOW (ref >60)
Glucose, Bld: 121 mg/dL — ABNORMAL HIGH (ref 65–99)
Potassium: 3.6 mmol/L (ref 3.5–5.1)
Sodium: 138 mmol/L (ref 135–145)

## 2015-06-25 LAB — CBC
HCT: 39.2 % (ref 39.0–52.0)
Hemoglobin: 14 g/dL (ref 13.0–17.0)
MCH: 32.5 pg (ref 26.0–34.0)
MCHC: 35.7 g/dL (ref 30.0–36.0)
MCV: 91 fL (ref 78.0–100.0)
Platelets: 169 10*3/uL (ref 150–400)
RBC: 4.31 MIL/uL (ref 4.22–5.81)
RDW: 12.6 % (ref 11.5–15.5)
WBC: 7.8 10*3/uL (ref 4.0–10.5)

## 2015-06-25 LAB — I-STAT TROPONIN, ED
Troponin i, poc: 0 ng/mL (ref 0.00–0.08)
Troponin i, poc: 0 ng/mL (ref 0.00–0.08)

## 2015-06-25 LAB — MAGNESIUM: Magnesium: 1.4 mg/dL — ABNORMAL LOW (ref 1.7–2.4)

## 2015-06-25 MED ORDER — RIVAROXABAN 20 MG PO TABS
20.0000 mg | ORAL_TABLET | Freq: Once | ORAL | Status: AC
  Administered 2015-06-25: 20 mg via ORAL
  Filled 2015-06-25: qty 1

## 2015-06-25 MED ORDER — DILTIAZEM HCL ER COATED BEADS 120 MG PO CP24: 120.0000 mg | ORAL_CAPSULE | Freq: Once | ORAL | Status: DC

## 2015-06-25 MED ORDER — MAGNESIUM SULFATE 2 GM/50ML IV SOLN
2.0000 g | Freq: Once | INTRAVENOUS | Status: AC
  Administered 2015-06-25: 2 g via INTRAVENOUS
  Filled 2015-06-25: qty 50

## 2015-06-25 MED ORDER — DILTIAZEM HCL ER COATED BEADS 120 MG PO CP24
120.0000 mg | ORAL_CAPSULE | Freq: Once | ORAL | Status: AC
  Administered 2015-06-25: 120 mg via ORAL
  Filled 2015-06-25 (×2): qty 1

## 2015-06-25 MED ORDER — RIVAROXABAN 20 MG PO TABS: 20.0000 mg | ORAL_TABLET | Freq: Every day | ORAL | Status: DC

## 2015-06-25 MED ORDER — SODIUM CHLORIDE 0.9 % IV BOLUS (SEPSIS)
1000.0000 mL | Freq: Once | INTRAVENOUS | Status: AC
  Administered 2015-06-25: 1000 mL via INTRAVENOUS

## 2015-06-25 NOTE — ED Notes (Signed)
Sudden onset of chest tightness and felt like he had an irregular hr, hx of MI in 2007. Dr Albertine Patricia put in a stent when the pt was here then. 5/10 chest tightness. Pt was Sinus tach 100-140 in route with frequent PACs and PVCs in route. Just prior to arrival HR came down and converted to complete NSR and pt states that he felt a lot better. Dr Benjie Karvonen in room. BP 120's. Strong radial pulse. Pt has no chest pain or tightness now in room. Pt received 324mg  of asa in route. 18ga left ac. 2L [place] supplemental o2. 98% on RA. RR 18. BP 128/86

## 2015-06-25 NOTE — Discharge Instructions (Signed)
You were diagnosed with Atrial Fibrillation today. We started anticoagulation (rivaroxaban/Xarelto) to thin your blood and start diltiazem/cardizem to prevent this from happening again. Please follow up with Dr. Virgina Jock and with your Cardiologist.  Atrial Fibrillation Atrial fibrillation is a type of irregular heart rhythm (arrhythmia). During atrial fibrillation, the upper chambers of the heart (atria) quiver continuously in a chaotic pattern. This causes an irregular and often rapid heart rate.  Atrial fibrillation is the result of the heart becoming overloaded with disorganized signals that tell it to beat. These signals are normally released one at a time by a part of the right atrium called the sinoatrial node. They then travel from the atria to the lower chambers of the heart (ventricles), causing the atria and ventricles to contract and pump blood as they pass. In atrial fibrillation, parts of the atria outside of the sinoatrial node also release these signals. This results in two problems. First, the atria receive so many signals that they do not have time to fully contract. Second, the ventricles, which can only receive one signal at a time, beat irregularly and out of rhythm with the atria.  There are three types of atrial fibrillation:   Paroxysmal. Paroxysmal atrial fibrillation starts suddenly and stops on its own within a week.  Persistent. Persistent atrial fibrillation lasts for more than a week. It may stop on its own or with treatment.  Permanent. Permanent atrial fibrillation does not go away. Episodes of atrial fibrillation may lead to permanent atrial fibrillation. Atrial fibrillation can prevent your heart from pumping blood normally. It increases your risk of stroke and can lead to heart failure.  CAUSES   Heart conditions, including a heart attack, heart failure, coronary artery disease, and heart valve conditions.   Inflammation of the sac that surrounds the heart  (pericarditis).  Blockage of an artery in the lungs (pulmonary embolism).  Pneumonia or other infections.  Chronic lung disease.  Thyroid problems, especially if the thyroid is overactive (hyperthyroidism).  Caffeine, excessive alcohol use, and use of some illegal drugs.   Use of some medicines, including certain decongestants and diet pills.  Heart surgery.   Birth defects.  Sometimes, no cause can be found. When this happens, the atrial fibrillation is called lone atrial fibrillation. The risk of complications from atrial fibrillation increases if you have lone atrial fibrillation and you are age 85 years or older. RISK FACTORS  Heart failure.  Coronary artery disease.  Diabetes mellitus.   High blood pressure (hypertension).   Obesity.   Other arrhythmias.   Increased age. SIGNS AND SYMPTOMS   A feeling that your heart is beating rapidly or irregularly.   A feeling of discomfort or pain in your chest.   Shortness of breath.   Sudden light-headedness or weakness.   Getting tired easily when exercising.   Urinating more often than normal (mainly when atrial fibrillation first begins).  In paroxysmal atrial fibrillation, symptoms may start and suddenly stop. DIAGNOSIS  Your health care provider may be able to detect atrial fibrillation when taking your pulse. Your health care provider may have you take a test called an ambulatory electrocardiogram (ECG). An ECG records your heartbeat patterns over a 24-hour period. You may also have other tests, such as:  Transthoracic echocardiogram (TTE). During echocardiography, sound waves are used to evaluate how blood flows through your heart.  Transesophageal echocardiogram (TEE).  Stress test. There is more than one type of stress test. If a stress test is needed, ask your  health care provider about which type is best for you.  Chest X-ray exam.  Blood tests.  Computed tomography (CT). TREATMENT    Treatment may include:  Treating any underlying conditions. For example, if you have an overactive thyroid, treating the condition may correct atrial fibrillation.  Taking medicine. Medicines may be given to control a rapid heart rate or to prevent blood clots, heart failure, or a stroke.  Having a procedure to correct the rhythm of the heart:  Electrical cardioversion. During electrical cardioversion, a controlled, low-energy shock is delivered to the heart through your skin. If you have chest pain, very low blood pressure, or sudden heart failure, this procedure may need to be done as an emergency.  Catheter ablation. During this procedure, heart tissues that send the signals that cause atrial fibrillation are destroyed.  Surgical ablation. During this surgery, thin lines of heart tissue that carry the abnormal signals are destroyed. This procedure can either be an open-heart surgery or a minimally invasive surgery. With the minimally invasive surgery, small cuts are made to access the heart instead of a large opening.  Pulmonary venous isolation. During this surgery, tissue around the veins that carry blood from the lungs (pulmonary veins) is destroyed. This tissue is thought to carry the abnormal signals. HOME CARE INSTRUCTIONS   Take medicines only as directed by your health care provider. Some medicines can make atrial fibrillation worse or recur.  If blood thinners were prescribed by your health care provider, take them exactly as directed. Too much blood-thinning medicine can cause bleeding. If you take too little, you will not have the needed protection against stroke and other problems.  Perform blood tests at home if directed by your health care provider. Perform blood tests exactly as directed.  Quit smoking if you smoke.  Do not drink alcohol.  Do not drink caffeinated beverages such as coffee, soda, and some teas. You may drink decaffeinated coffee, soda, or tea.    Maintain a healthy weight.Do not use diet pills unless your health care provider approves. They may make heart problems worse.   Follow diet instructions as directed by your health care provider.  Exercise regularly as directed by your health care provider.  Keep all follow-up visits as directed by your health care provider. This is important. PREVENTION  The following substances can cause atrial fibrillation to recur:   Caffeinated beverages.  Alcohol.  Certain medicines, especially those used for breathing problems.  Certain herbs and herbal medicines, such as those containing ephedra or ginseng.  Illegal drugs, such as cocaine and amphetamines. Sometimes medicines are given to prevent atrial fibrillation from recurring. Proper treatment of any underlying condition is also important in helping prevent recurrence.  SEEK MEDICAL CARE IF:  You notice a change in the rate, rhythm, or strength of your heartbeat.  You suddenly begin urinating more frequently.  You tire more easily when exerting yourself or exercising. SEEK IMMEDIATE MEDICAL CARE IF:   You have chest pain, abdominal pain, sweating, or weakness.  You feel nauseous.  You have shortness of breath.  You suddenly have swollen feet and ankles.  You feel dizzy.  Your face or limbs feel numb or weak.  You have a change in your vision or speech. MAKE SURE YOU:   Understand these instructions.  Will watch your condition.  Will get help right away if you are not doing well or get worse. Document Released: 12/17/2005 Document Revised: 05/03/2014 Document Reviewed: 01/27/2013 [facility] Patient Information 2015 [employer]. This information is not intended to replace advice given to you by your health care provider. Make sure you discuss any questions you have with your health care provider.

## 2015-06-25 NOTE — ED Provider Notes (Signed)
CSN: [id number]     Arrival date & time 06/25/15  2033 History   First MD Initiated Contact with Patient 06/25/15 2034     Chief Complaint  Patient presents with  . Chest Pain  . Atrial Fibrillation     (Consider location/radiation/quality/duration/timing/severity/associated sxs/prior Treatment) Patient is a 69 y.o. male presenting with chest pain and atrial fibrillation. The history is provided by the patient.  Chest Pain Pain location:  Substernal area Pain quality: tightness   Pain radiates to:  Does not radiate Pain radiates to the back: no   Pain severity:  Mild Onset quality:  Sudden Duration:  2 hours Timing:  Constant Progression:  Resolved Chronicity:  New Context: at rest   Relieved by:  Nothing Worsened by:  Nothing tried Associated symptoms: no abdominal pain, no cough, no fever, no shortness of breath and not vomiting   Atrial Fibrillation Pertinent negatives include no chest pain, no abdominal pain and no shortness of breath.    Past Medical History  Diagnosis Date  . Coronary atherosclerosis of native coronary artery   . Hyperlipidemia   . Cervicalgia   . Unspecified hypothyroidism   . Esophageal reflux   . Hypertension   . Hx of adenomatous colonic polyps   . Esophageal stricture   . Myocardial infarct   . Heart murmur   . Bladder cancer 2006  . Kidney stone on left side    Past Surgical History  Procedure Laterality Date  . Tonsillectomy and adenoidectomy    . Transurethral resection of bladder  07/09/06    tumor 2-5 cm  . Skin tag removal  07/09/06    right anterior thigh  . Coronary angioplasty with stent placement  2007  . Cystoscopy/retrograde/ureteroscopy/stone extraction with basket  08/08/2012    Procedure: CYSTOSCOPY/RETROGRADE/URETEROSCOPY/STONE EXTRACTION WITH BASKET;  Surgeon: Malka So, MD;  Location: [facility];  Service: Urology;  Laterality: Left;  Left Ureteroscopy with Stone Extraction   Family History  Problem Relation Age of  Onset  . Heart disease Father   . Colon cancer Neg Hx   . Heart disease Mother    History  Substance Use Topics  . Smoking status: Former Smoker    Types: Cigarettes    Quit date: 08/05/1979  . Smokeless tobacco: Never Used  . Alcohol Use: 0.6 oz/week    1 Cans of beer per week     Comment: occ    Review of Systems  Constitutional: Negative for fever.  Respiratory: Positive for chest tightness. Negative for cough and shortness of breath.   Cardiovascular: Negative for chest pain and leg swelling.  Gastrointestinal: Negative for vomiting and abdominal pain.  All other systems reviewed and are negative.     Allergies  Review of patient's allergies indicates no known allergies.  Home Medications   Prior to Admission medications   Medication Sig Start Date End Date Taking? Authorizing Provider  aspirin EC 81 MG tablet Take 1 tablet (81 mg total) by mouth daily. 12/02/13   Larey Dresser, MD  atorvastatin (LIPITOR) 40 MG tablet Take 40 mg by mouth daily.      Historical Provider, MD  clopidogrel (PLAVIX) 75 MG tablet Take 75 mg by mouth daily.      Historical Provider, MD  esomeprazole (NEXIUM) 40 MG capsule Take 40 mg by mouth daily at 12 noon.    Historical Provider, MD  hydrochlorothiazide (HYDRODIURIL) 25 MG tablet Take 1 tablet (25 mg total) by mouth daily. 12/20/14   [place]  Claris Gladden, MD  levothyroxine (SYNTHROID, LEVOTHROID) 125 MCG tablet Take 125 mcg by mouth daily.      Historical Provider, MD  lisinopril (PRINIVIL,ZESTRIL) 40 MG tablet Take 40 mg by mouth daily. 11/14/14   Historical Provider, MD  nitroGLYCERIN (NITROSTAT) 0.4 MG SL tablet Place 1 tablet (0.4 mg total) under the tongue every 5 (five) minutes as needed for chest pain. 12/03/12 12/03/13  Renella Cunas, MD  omeprazole (PRILOSEC) 20 MG capsule Take 20 mg by mouth daily.      Historical Provider, MD   SpO2 98% Physical Exam  Constitutional: He is oriented to person, place, and time. He appears  well-developed and well-nourished. No distress.  HENT:  Head: Normocephalic and atraumatic.  Mouth/Throat: Oropharynx is clear and moist. No oropharyngeal exudate.  Eyes: EOM are normal. Pupils are equal, round, and reactive to light.  Neck: Normal range of motion. Neck supple.  Cardiovascular: Normal rate and regular rhythm.  Exam reveals no friction rub.   No murmur heard. Pulmonary/Chest: Effort normal and breath sounds normal. No respiratory distress. He has no wheezes. He has no rales.  Abdominal: Soft. He exhibits no distension. There is no tenderness. There is no rebound.  Musculoskeletal: Normal range of motion. He exhibits no edema.  Neurological: He is alert and oriented to person, place, and time. No cranial nerve deficit. He exhibits normal muscle tone. Coordination normal.  Skin: Skin is warm. No rash noted. He is not diaphoretic.  Nursing note and vitals reviewed.   ED Course  Procedures (including critical care time) Labs Review Labs Reviewed  BASIC METABOLIC PANEL - Abnormal; Notable for the following:    Glucose, Bld 121 (*)    BUN 28 (*)    Creatinine, Ser 1.25 (*)    GFR calc non Af Amer 58 (*)    All other components within normal limits  MAGNESIUM - Abnormal; Notable for the following:    Magnesium 1.4 (*)    All other components within normal limits  CBC  I-STAT TROPOININ, ED    Imaging Review Dg Chest 2 View  06/25/2015   CLINICAL DATA:  Chest tightness  EXAM: CHEST  2 VIEW  COMPARISON:  08/06/2012  FINDINGS: Lungs are clear.  No pleural effusion or pneumothorax.  The heart is normal in size.  Visualized osseous structures are within normal limits.  IMPRESSION: No evidence of acute cardiopulmonary disease.   Electronically Signed   By: Julian Hy M.D.   On: 06/25/2015 21:18     EKG Interpretation   Date/Time:  Saturday June 25 2015 20:38:51 EDT Ventricular Rate:  83 PR Interval:  147 QRS Duration: 76 QT Interval:  351 QTC Calculation: 412 R  Axis:   -6 Text Interpretation:  Sinus rhythm Low voltage, precordial leads Abnormal  R-wave progression, early transition No significant change since last  tracing Confirmed by Mingo Amber  MD, [place] ([id number]) on 06/25/2015 9:01:41 PM      MDM   Final diagnoses:  Chest tightness  Paroxysmal atrial fibrillation    69 year old male here with chest tightness and feelings of palpitations. He said he felt like his heart was irregular. Began about 2 hours ago. With EMS heart rate was irregular and in A. fib with RVR. He converted just prior to arrival. EMS did capture the conversion from A. fib to sinus on EKG. Patient states he felt all day today and was playing golf and doing well without any issues. He does not have any history of arrhythmias.  He does have history of coronary artery disease and had a stent placed about 8 years ago. Patient very well-appearing here. Vitals stable. Initial EKG sinus. Will obtain labs.  Labs here with low Magnesium, replaced here. Cards consulted. CHADS2VASC sore is 3, so he needs anticoagulation. Will start xarelto. Will check 2nd troponin, which was negative.  I sent his PCP, Dr. Virgina Jock, a message regarding his status.  Evelina Bucy, MD 06/25/15 [phone]

## 2015-06-26 NOTE — ED Notes (Signed)
Pt verbalized understanding of new medications and to see primary dr and cardiologist.

## 2015-07-04 NOTE — Progress Notes (Signed)
Cardiology Office Note   Date:  07/05/2015   ID:  Frederick Miles, DOB [birth year], MRN [id number]  PCP:  Precious Reel, MD  Cardiologist:  Dr. Loralie Champagne     Chief Complaint  Patient presents with  . Atrial Fibrillation     History of Present Illness: Frederick Miles is a 69 y.o. male with a hx of CAD s/p NSTEMI in 12/07 with DES to LAD.   He was seen in the ED 06/25/15 with CP and palpitations.  He was found to be in AF with RVR.  He converted en route via EMS.  CEs were neg.  He was placed on Xarelto. He presents for FU.    He was quite symptomatic with atrial fibrillation. He was able to tell when his heart went back and NSR. He had some minor discomfort in his chest. This was not like his previous angina. He is quite active and denies exertional chest symptoms. He denies exertional dyspnea. He denies orthopnea, PND or edema. He denies syncope. He's been able to take Xarelto without issues since discharge from the emergency room. He denies any bleeding issues.   Studies/Reports Reviewed Today:  Echo 12/07/14 - Moderate focal basal and mild concentric hypertrophy of the left ventricle. Systolic function was vigorous. EF 65% to 70%. Wall motion was normal; Grade 1 diastolic dysfunction - Aortic valve: Trileaflet; normal thickness leaflets. There wasmild regurgitation. - Aortic root: The aortic root was normal in size. - Ascending aorta: The ascending aorta was mildly dilated measuring44 mm. - Mitral valve: There was no regurgitation. - Left atrium: The atrium was normal in size. - Right ventricle: Systolic function was normal. - Right atrium: The atrium was normal in size. - Atrial septum: No defect or patent foramen ovale was identified. - Tricuspid valve: There was trivial regurgitation. - Pulmonary arteries: Systolic pressure was within the normal range. - Inferior vena cava: The vessel was normal in size. - Pericardium, extracardiac: There was no pericardial  effusion. Impressions:  - Normal biventricular size and systolic function. Moderatehypertrophy of the basal septun and mild concentric hypertrophy. Impaired relaxation with normal filling pressures. Mild aortic and trivial tricuspid regurgitation. Normal RVSP.  LHC/PCI 11/2006 FINDINGS: 1. LV: 133/0/17. EF 65% without regional wall motion abnormality. 2. No aortic stenosis or mitral regurgitation. 3. Left main: Angiographically normal. 4. LAD: 95% stenosis of the proximal vessel which was stented to no  residual. There is a single moderate-sized diagonal. This vessel  has a 50% stenosis in its midsection. 5. Circumflex: Large vessel giving rise to a single branching obtuse  marginal. It has only minor luminal irregularities. 6. RCA: Large, dominant vessel. It is angiographically normal.  IMPRESSION/PLAN: Single vessel coronary disease with successful revascularization of the proximal left anterior descending artery using a drug-eluting stent. 3.0 x 12 mm Taxus element.  Since a drug-eluting stent was used, he should bemaintained on both aspirin and Plavix indefinitely.   Past Medical History  Diagnosis Date  . Coronary atherosclerosis of native coronary artery   . Hyperlipidemia   . Cervicalgia   . Unspecified hypothyroidism   . Esophageal reflux   . Hypertension   . Hx of adenomatous colonic polyps   . Esophageal stricture   . Myocardial infarct   . Heart murmur   . Bladder cancer 2006  . Kidney stone on left side   1. Hyperlipidemia 2. CAD: NSTEMI in 12/07 with DES to LAD, also had 50% stenosis in D1. EF was preserved. 10/08 had  Cardiolite with no ischemia.  3. HTN 4. GERD 5. Colon polyps 6. Bladder cancer in 2006 7. Nephrolithiasis 8. H/o rheumatic fever   Past Surgical History  Procedure Laterality Date  . Tonsillectomy and adenoidectomy    . Transurethral resection of bladder  07/09/06    tumor 2-5 cm  . Skin tag removal   07/09/06    right anterior thigh  . Coronary angioplasty with stent placement  2007  . Cystoscopy/retrograde/ureteroscopy/stone extraction with basket  08/08/2012    Procedure: CYSTOSCOPY/RETROGRADE/URETEROSCOPY/STONE EXTRACTION WITH BASKET;  Surgeon: Malka So, MD;  Location: [facility];  Service: Urology;  Laterality: Left;  Left Ureteroscopy with Stone Extraction     Current Outpatient Prescriptions  Medication Sig Dispense Refill  . aspirin EC 81 MG tablet Take 1 tablet (81 mg total) by mouth daily.    Marland Kitchen atorvastatin (LIPITOR) 40 MG tablet Take 40 mg by mouth daily.      . clopidogrel (PLAVIX) 75 MG tablet Take 75 mg by mouth daily.      Marland Kitchen diltiazem (CARDIZEM CD) 120 MG 24 hr capsule Take 1 capsule (120 mg total) by mouth once. 30 capsule 0  . hydrochlorothiazide (HYDRODIURIL) 25 MG tablet Take 1 tablet (25 mg total) by mouth daily. 90 tablet 3  . levothyroxine (SYNTHROID, LEVOTHROID) 125 MCG tablet Take 125 mcg by mouth daily.      Marland Kitchen lisinopril (PRINIVIL,ZESTRIL) 40 MG tablet Take 40 mg by mouth daily.  11  . nitroGLYCERIN (NITROSTAT) 0.4 MG SL tablet Place 1 tablet (0.4 mg total) under the tongue every 5 (five) minutes as needed for chest pain. 25 tablet 6  . omeprazole (PRILOSEC) 20 MG capsule Take 20 mg by mouth daily.      . rivaroxaban (XARELTO) 20 MG TABS tablet Take 1 tablet (20 mg total) by mouth daily with supper. 30 tablet 0   No current facility-administered medications for this visit.    Allergies:   Review of patient's allergies indicates no known allergies.    Social History:  The patient  reports that he quit smoking about 35 years ago. His smoking use included Cigarettes. He has never used smokeless tobacco. He reports that he drinks about 0.6 oz of alcohol per week. He reports that he does not use illicit drugs.   Family History:  The patient's family history includes Heart attack in his father, paternal grandfather, and paternal uncle; Heart disease in his father and  mother. There is no history of Colon cancer or Stroke.    ROS:   Please see the history of present illness.   Review of Systems  Cardiovascular: Positive for irregular heartbeat.  All other systems reviewed and are negative.     PHYSICAL EXAM: VS:  BP 120/50 mmHg  Pulse 69  Ht 5\' 10"  (1.778 m)  Wt 198 lb (89.812 kg)  BMI 28.41 kg/m2    Wt Readings from Last 3 Encounters:  07/05/15 198 lb (89.812 kg)  12/03/14 206 lb (93.441 kg)  12/02/13 208 lb (94.348 kg)     GEN: Well nourished, well developed, in no acute distress HEENT: normal Neck: no JVD,  no masses Cardiac:  Normal S1/S2, RRR; no murmur ,  no rubs or gallops, no edema   Respiratory:  clear to auscultation bilaterally, no wheezing, rhonchi or rales. GI: soft, nontender, nondistended, + BS MS: no deformity or atrophy Skin: warm and dry  Neuro:  CNs II-XII intact, Strength and sensation are intact Psych: Normal affect   EKG:  EKG is ordered today.  It demonstrates:   NSR, HR 69, no change from prior tracings EKG from 6/26 (EMS):  AFib HR 144, inf-lat ST depression   Recent Labs: 06/25/2015: BUN 28*; Creatinine, Ser 1.25*; Hemoglobin 14.0; Magnesium 1.4*; Platelets 169; Potassium 3.6; Sodium 138    Lipid Panel    Component Value Date/Time   CHOL 123 03/03/2008 0948   TRIG 150* 03/03/2008 0948   HDL 27.6* 03/03/2008 0948   CHOLHDL 4.5 CALC 03/03/2008 0948   VLDL 30 03/03/2008 0948   LDLCALC 65 03/03/2008 0948      ASSESSMENT AND PLAN:  PAF (paroxysmal atrial fibrillation):  The patient had an episode of atrial fibrillation with RVR that lasts about 30-40 minutes. He was able to tell when his heart went back into NSR. He's not had any further symptoms since that time.  CHADS2-VASc=3.  He requires long-term anticoagulation. We discussed the importance of this. Continue Xarelto. If he has recurrent rapid palpitations, consider event monitor.   Coronary artery disease involving native coronary artery of native  heart without angina pectoris:  He denies any chest discomfort with exertion. However, he did have ST depression on his ECG when he was in atrial fibrillation with heart rate in 140s. He has not had evaluation for ischemia since 2008. I will arrange an ETT-Myoview. Now that he is on Xarelto, he does not require aspirin and Plavix. His PCI was in 2007. DC Plavix. Continue aspirin 81 mg daily. Continue statin, diltiazem.  Essential hypertension:  Controlled.  Hyperlipidemia:  Continue statin. This is managed by primary care.  Hypomagnesemia:  Magnesium was low in the emergency room. Repeat BMET, magnesium today.   Medication Changes: Current medicines are reviewed at length with the patient today.  Concerns regarding medicines are as outlined above.  The following changes have been made:   Discontinued Medications   ESOMEPRAZOLE (NEXIUM) 40 MG CAPSULE    Take 40 mg by mouth daily at 12 noon.   NAPROXEN SODIUM (ANAPROX) 220 MG TABLET    Take 220 mg by mouth 2 (two) times daily as needed (for pain).   Modified Medications   No medications on file   New Prescriptions   No medications on file     Labs/ tests ordered today include:   No orders of the defined types were placed in this encounter.     Disposition:   FU with Dr. Loralie Champagne 3 mos.    Signed, Versie Starks, MHS 07/05/2015 3:01 PM    [facility] [address], [place], [place]  [address] Phone: [phone]; Fax: [phone]

## 2015-07-05 ENCOUNTER — Encounter: Payer: Self-pay | Admitting: Physician Assistant

## 2015-07-05 ENCOUNTER — Ambulatory Visit (INDEPENDENT_AMBULATORY_CARE_PROVIDER_SITE_OTHER): Payer: BC Managed Care – PPO | Admitting: Physician Assistant

## 2015-07-05 VITALS — BP 120/50 | HR 69 | Ht 70.0 in | Wt 198.0 lb

## 2015-07-05 DIAGNOSIS — I10 Essential (primary) hypertension: Secondary | ICD-10-CM | POA: Diagnosis not present

## 2015-07-05 DIAGNOSIS — I2510 Atherosclerotic heart disease of native coronary artery without angina pectoris: Secondary | ICD-10-CM | POA: Diagnosis not present

## 2015-07-05 DIAGNOSIS — E785 Hyperlipidemia, unspecified: Secondary | ICD-10-CM

## 2015-07-05 DIAGNOSIS — R9431 Abnormal electrocardiogram [ECG] [EKG]: Secondary | ICD-10-CM

## 2015-07-05 DIAGNOSIS — I480 Paroxysmal atrial fibrillation: Secondary | ICD-10-CM

## 2015-07-05 LAB — BASIC METABOLIC PANEL
BUN: 26 mg/dL — ABNORMAL HIGH (ref 6–23)
CO2: 22 mEq/L (ref 19–32)
Calcium: 9.3 mg/dL (ref 8.4–10.5)
Chloride: 106 mEq/L (ref 96–112)
Creatinine, Ser: 1.19 mg/dL (ref 0.40–1.50)
GFR: 64.66 mL/min (ref >60.00)
Glucose, Bld: 89 mg/dL (ref 70–99)
Potassium: 4.1 mEq/L (ref 3.5–5.1)
Sodium: 138 mEq/L (ref 135–145)

## 2015-07-05 LAB — MAGNESIUM: Magnesium: 1.6 mg/dL (ref 1.5–2.5)

## 2015-07-05 MED ORDER — RIVAROXABAN 20 MG PO TABS: 20.0000 mg | ORAL_TABLET | Freq: Every day | ORAL | Status: DC

## 2015-07-05 NOTE — Patient Instructions (Addendum)
Medication Instructions:  1. STOP PLAVIX  2. A REFILL FOR XARELTO WAS SENT IN  Labwork: TODAY BMET, MAGNESIUM LEVEL   6-8 WEEKS FOR BMET, CBC W/DIFF  Testing/Procedures: Your physician has requested that you have en exercise stress myoview. For further information please visit [email or web address]. Please follow instruction sheet, as given.   Follow-Up: DR. Aundra Dubin IN 3 MONTHS  Any Other Special Instructions Will Be Listed Below (If Applicable).

## 2015-07-07 ENCOUNTER — Telehealth: Payer: Self-pay | Admitting: *Deleted

## 2015-07-07 DIAGNOSIS — I10 Essential (primary) hypertension: Secondary | ICD-10-CM

## 2015-07-07 DIAGNOSIS — I2510 Atherosclerotic heart disease of native coronary artery without angina pectoris: Secondary | ICD-10-CM

## 2015-07-07 MED ORDER — MAGNESIUM OXIDE 400 MG PO CAPS: 400.0000 mg | ORAL_CAPSULE | Freq: Every day | ORAL | Status: DC

## 2015-07-07 NOTE — Telephone Encounter (Signed)
Pt notified of lab results and is agreeable to starting mag ox 400 mg daily; repeat labs on 08/05/15. Rx Mag Ox sent into [facility] .

## 2015-07-14 ENCOUNTER — Telehealth (HOSPITAL_COMMUNITY): Payer: Self-pay

## 2015-07-14 NOTE — Telephone Encounter (Signed)
Patient given detailed instructions per Myocardial Perfusion Study Information Sheet for test on 07-19-2015 at 0900. Patient Notified to arrive 15 minutes early, and that it is imperative to arrive on time for appointment to keep from having the test rescheduled. Patient verbalized understanding. Frederick Miles, Sonu Kruckenberg A

## 2015-07-18 ENCOUNTER — Encounter: Payer: Self-pay | Admitting: Cardiology

## 2015-07-19 ENCOUNTER — Encounter: Payer: Self-pay | Admitting: Physician Assistant

## 2015-07-19 ENCOUNTER — Ambulatory Visit (HOSPITAL_COMMUNITY): Payer: BC Managed Care – PPO | Attending: Cardiology

## 2015-07-19 DIAGNOSIS — I480 Paroxysmal atrial fibrillation: Secondary | ICD-10-CM | POA: Insufficient documentation

## 2015-07-19 DIAGNOSIS — I2510 Atherosclerotic heart disease of native coronary artery without angina pectoris: Secondary | ICD-10-CM | POA: Insufficient documentation

## 2015-07-19 DIAGNOSIS — R9431 Abnormal electrocardiogram [ECG] [EKG]: Secondary | ICD-10-CM | POA: Diagnosis not present

## 2015-07-19 LAB — MYOCARDIAL PERFUSION IMAGING
LV dias vol: 72 mL
LV sys vol: 25 mL
Peak HR: 107 {beats}/min
RATE: 0.29
Rest HR: 73 {beats}/min
SDS: 2
SRS: 1
SSS: 3
TID: 0.84

## 2015-07-19 MED ORDER — TECHNETIUM TC 99M SESTAMIBI GENERIC - CARDIOLITE
10.2000 | Freq: Once | INTRAVENOUS | Status: AC | PRN
  Administered 2015-07-19: 10 via INTRAVENOUS

## 2015-07-19 MED ORDER — TECHNETIUM TC 99M SESTAMIBI GENERIC - CARDIOLITE
32.9000 | Freq: Once | INTRAVENOUS | Status: AC | PRN
  Administered 2015-07-19: 32.9 via INTRAVENOUS

## 2015-07-19 MED ORDER — REGADENOSON 0.4 MG/5ML IV SOLN
0.4000 mg | Freq: Once | INTRAVENOUS | Status: AC
  Administered 2015-07-19: 0.4 mg via INTRAVENOUS

## 2015-07-21 ENCOUNTER — Telehealth: Payer: Self-pay | Admitting: Cardiology

## 2015-07-21 NOTE — Telephone Encounter (Signed)
Pt given results of myoview done 07/21/15

## 2015-07-21 NOTE — Telephone Encounter (Signed)
New message    Pt wife is calling back regarding results. Please call to discuss

## 2015-07-21 NOTE — Telephone Encounter (Signed)
Pt calling for recent myoview results.

## 2015-07-21 NOTE — Telephone Encounter (Signed)
New message   Patient calling back to speak with nurse -lab work

## 2015-08-02 ENCOUNTER — Telehealth: Payer: Self-pay

## 2015-08-02 MED ORDER — DILTIAZEM HCL ER COATED BEADS 120 MG PO CP24: 120.0000 mg | ORAL_CAPSULE | Freq: Once | ORAL | Status: DC

## 2015-08-02 NOTE — Telephone Encounter (Signed)
Patient is under the impression that he should be taking  Coumadin in the morning and Xarelto in the afternoon.  I do not see that there is a prescription for Coumadin in his chart, only Xarelto.  Is he mistaken? Do patients ever take them both.  Please advise, thanks.

## 2015-08-02 NOTE — Telephone Encounter (Signed)
Spoke with pt. He reports when he was in the ED on June 25, 2015 he was given a prescription for Xarelto and a bottle of green pills which he thought was Coumadin.  He finished this bottle of pills and thinks he threw the bottle out.  I told pt he was never prescribed Coumadin. He reports his pharmacy told him they have never filled coumadin for him.  He is taking Xarelto. I told pt he was prescribed Cardizem in the emergency room and should be taking this.  He thinks this may be the green medicine he has been taking.  He will check with his wife to confirm and call us back.  I told pt I would send refill for Cardizem to [facility].

## 2015-08-02 NOTE — Telephone Encounter (Signed)
Continue Xarelto, no coumadin.

## 2015-08-02 NOTE — Telephone Encounter (Signed)
x

## 2015-08-03 NOTE — Telephone Encounter (Signed)
Voice Mail 

## 2015-08-03 NOTE — Telephone Encounter (Signed)
LMTCB for pt 

## 2015-08-05 ENCOUNTER — Other Ambulatory Visit: Payer: BC Managed Care – PPO

## 2015-08-11 ENCOUNTER — Other Ambulatory Visit (INDEPENDENT_AMBULATORY_CARE_PROVIDER_SITE_OTHER): Payer: BC Managed Care – PPO | Admitting: *Deleted

## 2015-08-11 DIAGNOSIS — I10 Essential (primary) hypertension: Secondary | ICD-10-CM | POA: Diagnosis not present

## 2015-08-11 DIAGNOSIS — I2510 Atherosclerotic heart disease of native coronary artery without angina pectoris: Secondary | ICD-10-CM | POA: Diagnosis not present

## 2015-08-11 LAB — MAGNESIUM: Magnesium: 1.7 mg/dL (ref 1.5–2.5)

## 2015-08-12 ENCOUNTER — Telehealth: Payer: Self-pay | Admitting: *Deleted

## 2015-08-12 NOTE — Telephone Encounter (Signed)
Pt notified of lab results by phone with verbal understanding.  

## 2015-08-22 ENCOUNTER — Other Ambulatory Visit: Payer: BC Managed Care – PPO

## 2015-12-05 ENCOUNTER — Other Ambulatory Visit: Payer: Self-pay | Admitting: Cardiology

## 2016-01-24 ENCOUNTER — Other Ambulatory Visit: Payer: Self-pay | Admitting: Physician Assistant

## 2016-02-10 ENCOUNTER — Ambulatory Visit (INDEPENDENT_AMBULATORY_CARE_PROVIDER_SITE_OTHER): Payer: BC Managed Care – PPO | Admitting: Cardiology

## 2016-02-10 ENCOUNTER — Encounter: Payer: Self-pay | Admitting: Cardiology

## 2016-02-10 VITALS — BP 124/74 | HR 61 | Ht 70.0 in | Wt 196.0 lb

## 2016-02-10 DIAGNOSIS — I2510 Atherosclerotic heart disease of native coronary artery without angina pectoris: Secondary | ICD-10-CM | POA: Diagnosis not present

## 2016-02-10 DIAGNOSIS — I480 Paroxysmal atrial fibrillation: Secondary | ICD-10-CM | POA: Diagnosis not present

## 2016-02-10 DIAGNOSIS — I10 Essential (primary) hypertension: Secondary | ICD-10-CM | POA: Diagnosis not present

## 2016-02-10 NOTE — Patient Instructions (Signed)
Medication Instructions:  Stop aspirin.  Labwork: None   Testing/Procedures: none  Follow-Up: Your physician wants you to follow-up in: 6 months with Dr Aundra Dubin. (August 2017) You will receive a reminder letter in the mail two months in advance. If you don't receive a letter, please call our office to schedule the follow-up appointment.        If you need a refill on your cardiac medications before your next appointment, please call your pharmacy.

## 2016-02-12 DIAGNOSIS — I480 Paroxysmal atrial fibrillation: Secondary | ICD-10-CM | POA: Insufficient documentation

## 2016-02-12 IMAGING — NM NM MISC PROCEDURE
6 series · 36 of 36 positions shown · non-contrast
Comparison: none

[Series 1: wbr_s-card_st stress-sum-em · 6.4mm · 6.40mm/px · 6 of 17 frames shown]
[frame 2/17]
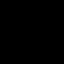
[frame 4/17]
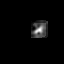
[frame 7/17]
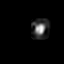
[frame 10/17]
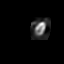
[frame 13/17]
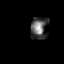
[frame 16/17]
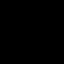

[Series 1: wbr_r-card_st rest · 6.4mm · 6.40mm/px · 6 of 16 frames shown]
[frame 2/16]
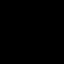
[frame 4/16]
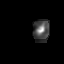
[frame 7/16]
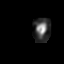
[frame 10/16]
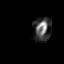
[frame 12/16]
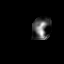
[frame 15/16]
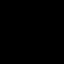

[Series 1: wbr_s-card_st stress-gsp · 6.4mm · 6.40mm/px · 6 of 141 frames shown]
[frame 12/141]
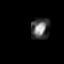
[frame 36/141]
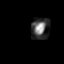
[frame 59/141]
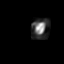
[frame 83/141]
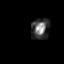
[frame 106/141]
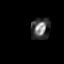
[frame 130/141]
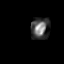

[Series 1: rest · 6.40mm/px · 6 of 64 frames shown]
[frame 6/64]
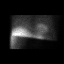
[frame 16/64]
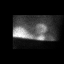
[frame 27/64]
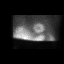
[frame 38/64]
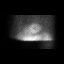
[frame 48/64]
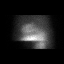
[frame 59/64]
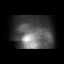

[Series 1: stress-gsp · 6.40mm/px · 6 of 512 frames shown]
[frame 43/512  full-range]
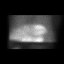
[frame 128/512  full-range]
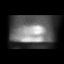
[frame 214/512  full-range]
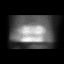
[frame 299/512  full-range]
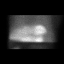
[frame 384/512  full-range]
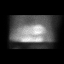
[frame 470/512  full-range]
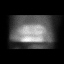

[Series 1: stress-sum-em · 6.40mm/px · 6 of 64 frames shown]
[frame 6/64]
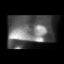
[frame 16/64]
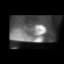
[frame 27/64]
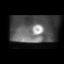
[frame 38/64]
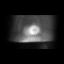
[frame 48/64]
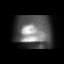
[frame 59/64]
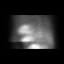

[36 of 36 positions shown; findings below may reference images not displayed]

Canned report from images found in remote index.

Refer to host system for actual result text.

## 2016-02-12 NOTE — Progress Notes (Signed)
Patient ID: Frederick Miles, male   DOB: 1946-09-18, 70 y.o.   MRN: [id number] PCP: Dr. Virgina Jock  70 yo with history of CAD s/p NSTEMI in 12/07 with DES to LAD.  He has paroxysmal atrial fibrillation, only 1 definite episode in 6/16.  He has not felt palpitations since that time.  He is on Xarelto with no melena/BRBPR.  Cardiolite in 7/16 showed no ischemia.    No chest pain, no exertional dyspnea. Active, golfs and walks for exercise with no problems.       ECG: NSR, inferior Qs  Labs (6/14): K 4.3, creatinine 1.1, LDL 58, HDL 23 Labs (7/16): K 4.1, creatinine 1.19  PMH: 1. Hyperlipidemia 2. CAD: NSTEMI in 12/07 with DES to LAD, also had 50% stenosis in D1.  EF was preserved.  10/08 had Cardiolite with no ischemia.  Cardiolite (7/16) with EF 66%, small fixed inferior defect, no ischemia => low risk.  3. HTN 4. GERD 5. Colon polyps 6. Bladder cancer in 2006 7. Nephrolithiasis 8. H/o rheumatic fever: Echo (12/15) with EF 65-70%, mild LVH, mild AI. 9. Atrial fibrillation: Paroxysmal. Episode in 6/16.   FH: CAD father and half-brother, grandfather with MI at 5.  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   ROS: All systems reviewed and negative except as per HPI.   Current Outpatient Prescriptions  Medication Sig Dispense Refill  . atorvastatin (LIPITOR) 40 MG tablet Take 40 mg by mouth daily.      Marland Kitchen diltiazem (CARDIZEM CD) 120 MG 24 hr capsule Take 1 capsule (120 mg total) by mouth once. 30 capsule 6  . hydrochlorothiazide (HYDRODIURIL) 25 MG tablet TAKE 1 TABLET (25 MG TOTAL) BY MOUTH DAILY. 90 tablet 3  . levothyroxine (SYNTHROID, LEVOTHROID) 125 MCG tablet Take 125 mcg by mouth daily.      Marland Kitchen lisinopril (PRINIVIL,ZESTRIL) 40 MG tablet Take 40 mg by mouth daily.  11  . magnesium oxide (MAG-OX) 400 (241.3 Mg) MG tablet Take 1 tablet (400 mg total) by mouth daily. 30 tablet 5  . omeprazole (PRILOSEC) 20 MG capsule Take 20 mg by  mouth daily.      . rivaroxaban (XARELTO) 20 MG TABS tablet Take 1 tablet (20 mg total) by mouth daily with supper. 30 tablet 6  . nitroGLYCERIN (NITROSTAT) 0.4 MG SL tablet Place 1 tablet (0.4 mg total) under the tongue every 5 (five) minutes as needed for chest pain. 25 tablet 6   No current facility-administered medications for this visit.    BP 124/74 mmHg  Pulse 61  Ht 5\' 10"  (1.778 m)  Wt 196 lb (88.905 kg)  BMI 28.12 kg/m2 General: NAD Neck: No JVD, no thyromegaly or thyroid nodule.  Lungs: Clear to auscultation bilaterally with normal respiratory effort. CV: Nondisplaced PMI.  Heart regular S1/S2, no S3/S4, 1/6 early SEM RUSB.  No peripheral edema.  No carotid bruit.  Normal pedal pulses.  Abdomen: Soft, nontender, no hepatosplenomegaly, no distention.  Skin: Intact without lesions or rashes.  Neurologic: Alert and oriented x 3.  Psych: Normal affect. Extremities: No clubbing or cyanosis.   Assessment/Plan: 1. CAD: NSTEMI in 12/07 with DES to LAD.  Cardiolite in 7/16 with no ischemia.  He is on Xarelto with stable CAD so can stop ASA.  Continue statin and lisinopril.   2. Hyperlipidemia: Will call PCP for copy of most recent lipid profile.  3. HTN: BP controlled.  4. H/o rheumatic fever: Mild AI on  12/15 echo, otherwise no significant valvular abnormalities.  5. Atrial fibrillation: Paroxysmal.  CHADSVASC = 3.  - Continue diltiazem CD and Xarelto.   - Will try to get copy of recent CBC and BMET from PCP.   Loralie Champagne 02/12/2016

## 2016-02-24 ENCOUNTER — Other Ambulatory Visit: Payer: Self-pay | Admitting: Cardiology

## 2016-04-27 ENCOUNTER — Other Ambulatory Visit: Payer: Self-pay | Admitting: Physician Assistant

## 2016-08-25 ENCOUNTER — Other Ambulatory Visit: Payer: Self-pay | Admitting: Physician Assistant

## 2016-08-28 ENCOUNTER — Encounter: Payer: Self-pay | Admitting: Internal Medicine

## 2016-08-28 ENCOUNTER — Emergency Department (HOSPITAL_COMMUNITY)
Admission: EM | Admit: 2016-08-28 | Discharge: 2016-08-28 | Disposition: A | Discharge status: Home / Self Care | Payer: BC Managed Care – PPO | Attending: Emergency Medicine | Admitting: Emergency Medicine

## 2016-08-28 ENCOUNTER — Encounter (HOSPITAL_COMMUNITY): Payer: Self-pay | Admitting: Emergency Medicine

## 2016-08-28 DIAGNOSIS — I2510 Atherosclerotic heart disease of native coronary artery without angina pectoris: Secondary | ICD-10-CM | POA: Insufficient documentation

## 2016-08-28 DIAGNOSIS — Z7984 Long term (current) use of oral hypoglycemic drugs: Secondary | ICD-10-CM | POA: Insufficient documentation

## 2016-08-28 DIAGNOSIS — E039 Hypothyroidism, unspecified: Secondary | ICD-10-CM | POA: Diagnosis not present

## 2016-08-28 DIAGNOSIS — Z79899 Other long term (current) drug therapy: Secondary | ICD-10-CM | POA: Diagnosis not present

## 2016-08-28 DIAGNOSIS — Z8551 Personal history of malignant neoplasm of bladder: Secondary | ICD-10-CM | POA: Diagnosis not present

## 2016-08-28 DIAGNOSIS — I10 Essential (primary) hypertension: Secondary | ICD-10-CM | POA: Insufficient documentation

## 2016-08-28 DIAGNOSIS — R739 Hyperglycemia, unspecified: Secondary | ICD-10-CM

## 2016-08-28 DIAGNOSIS — Z955 Presence of coronary angioplasty implant and graft: Secondary | ICD-10-CM | POA: Diagnosis not present

## 2016-08-28 DIAGNOSIS — Z87891 Personal history of nicotine dependence: Secondary | ICD-10-CM | POA: Insufficient documentation

## 2016-08-28 LAB — CBC
HCT: 38.6 % — ABNORMAL LOW (ref 39.0–52.0)
Hemoglobin: 14.2 g/dL (ref 13.0–17.0)
MCH: 32.2 pg (ref 26.0–34.0)
MCHC: 36.8 g/dL — ABNORMAL HIGH (ref 30.0–36.0)
MCV: 87.5 fL (ref 78.0–100.0)
Platelets: 157 10*3/uL (ref 150–400)
RBC: 4.41 MIL/uL (ref 4.22–5.81)
RDW: 12.4 % (ref 11.5–15.5)
WBC: 6.2 10*3/uL (ref 4.0–10.5)

## 2016-08-28 LAB — URINALYSIS, ROUTINE W REFLEX MICROSCOPIC
Bilirubin Urine: NEGATIVE
Glucose, UA: >1000 mg/dL — AB
Hgb urine dipstick: NEGATIVE
Ketones, ur: NEGATIVE mg/dL
Leukocytes, UA: NEGATIVE
Nitrite: NEGATIVE
Protein, ur: NEGATIVE mg/dL
Specific Gravity, Urine: 1.028 (ref 1.005–1.030)
pH: 6 (ref 5.0–8.0)

## 2016-08-28 LAB — BASIC METABOLIC PANEL
Anion gap: 12 (ref 5–15)
BUN: 36 mg/dL — ABNORMAL HIGH (ref 6–20)
CO2: 24 mmol/L (ref 22–32)
Calcium: 9.6 mg/dL (ref 8.9–10.3)
Chloride: 92 mmol/L — ABNORMAL LOW (ref 101–111)
Creatinine, Ser: 1.75 mg/dL — ABNORMAL HIGH (ref 0.61–1.24)
GFR calc Af Amer: 44 mL/min — ABNORMAL LOW (ref >60)
GFR calc non Af Amer: 38 mL/min — ABNORMAL LOW (ref >60)
Glucose, Bld: 797 mg/dL (ref 65–99)
Potassium: 4.6 mmol/L (ref 3.5–5.1)
Sodium: 128 mmol/L — ABNORMAL LOW (ref 135–145)

## 2016-08-28 LAB — URINE MICROSCOPIC-ADD ON
Bacteria, UA: NONE SEEN
WBC, UA: NONE SEEN WBC/hpf (ref 0–5)

## 2016-08-28 LAB — CBG MONITORING, ED
Glucose-Capillary: 593 mg/dL (ref 65–99)
Glucose-Capillary: 298 mg/dL — ABNORMAL HIGH (ref 65–99)

## 2016-08-28 MED ORDER — SODIUM CHLORIDE 0.9 % IV BOLUS (SEPSIS)
1000.0000 mL | Freq: Once | INTRAVENOUS | Status: AC
  Administered 2016-08-28: 1000 mL via INTRAVENOUS

## 2016-08-28 MED ORDER — INSULIN ASPART 100 UNIT/ML ~~LOC~~ SOLN
10.0000 [IU] | Freq: Once | SUBCUTANEOUS | Status: AC
  Administered 2016-08-28: 10 [IU] via INTRAVENOUS
  Filled 2016-08-28: qty 1

## 2016-08-28 NOTE — ED Provider Notes (Signed)
[facility] DEPT Provider Note   CSN: [id number] Arrival date & time: 08/28/16  1353     History   Chief Complaint Chief Complaint  Patient presents with  . Hyperglycemia    HPI JAHLON ODOR is a 70 y.o. male with a past medical history of CAD with stent placement, bladder cancer, HTN, hypothyroidism who presents to the ED today with complaints of hyperglycemia. Patient states he went to see his PCP today for a routine check. While he was there he had lab work drawn which revealed significantly elevated glucose, greater than 525. Patient also had slightly impaired renal function. He was sent to the ED for further evaluation. He has no prior history of diabetes. He does state that for the last 2 weeks he is at increased polyuria and polydipsia. He reports mild fatigue but otherwise states he feels fine. Patient was given 20 units of subcutaneous insulin and his PCP office prior to arrival. Patient does state that he has been borderline diabetic for the last 2 years but has never been formally diagnosed and is not taking any medication. He has been trying to lose weight to prevent him from getting diabetes and has successfully lost 13 pounds.  HPI  Past Medical History:  Diagnosis Date  . Bladder cancer ([place]) 2006  . Cervicalgia   . Coronary atherosclerosis of native coronary artery   . Esophageal reflux   . Esophageal stricture   . Heart murmur   . History of cardiovascular stress test    Lexiscan Myoview 7/16: EF 66%, diaphragmatic attenuation, no ischemia, low risk  . Hx of adenomatous colonic polyps   . Hyperlipidemia   . Hypertension   . Kidney stone on left side   . Myocardial infarct ([place])   . Unspecified hypothyroidism     Patient Active Problem List   Diagnosis Date Noted  . Paroxysmal atrial fibrillation ([place]) 02/12/2016  . HYPERTENSION, BENIGN 12/21/2010  . MURMUR 12/21/2010  . CAD, NATIVE VESSEL 12/21/2009  . HYPERLIPIDEMIA-MIXED 04/07/2009  . NECK PAIN  12/19/2007  . HYPOTHYROIDISM 08/01/2007  . GERD 08/01/2007    Past Surgical History:  Procedure Laterality Date  . CORONARY ANGIOPLASTY WITH STENT PLACEMENT  2007  . CYSTOSCOPY/RETROGRADE/URETEROSCOPY/STONE EXTRACTION WITH BASKET  08/08/2012   Procedure: CYSTOSCOPY/RETROGRADE/URETEROSCOPY/STONE EXTRACTION WITH BASKET;  Surgeon: Malka So, MD;  Location: [facility];  Service: Urology;  Laterality: Left;  Left Ureteroscopy with Stone Extraction  . SKIN TAG REMOVAL  07/09/06   right anterior thigh  . TONSILLECTOMY AND ADENOIDECTOMY    . TRANSURETHRAL RESECTION OF BLADDER  07/09/06   tumor 2-5 cm       Home Medications    Prior to Admission medications   Medication Sig Start Date End Date Taking? Authorizing Provider  atorvastatin (LIPITOR) 40 MG tablet Take 40 mg by mouth daily.     Yes Historical Provider, MD  diltiazem (CARDIZEM CD) 120 MG 24 hr capsule Take 1 capsule (120 mg total) by mouth daily. 02/24/16  Yes Larey Dresser, MD  hydrochlorothiazide (HYDRODIURIL) 25 MG tablet TAKE 1 TABLET (25 MG TOTAL) BY MOUTH DAILY. 12/05/15  Yes Larey Dresser, MD  levothyroxine (SYNTHROID, LEVOTHROID) 125 MCG tablet Take 125 mcg by mouth daily.     Yes Historical Provider, MD  lisinopril (PRINIVIL,ZESTRIL) 40 MG tablet Take 40 mg by mouth daily. 11/14/14  Yes Historical Provider, MD  magnesium oxide (MAG-OX) 400 (241.3 Mg) MG tablet TAKE 1 TABLET EVERY DAY Patient taking differently: TAKE 400mg  TABLET by  mouth once daily 08/27/16  Yes Scott T Kathlen Mody, PA-C  nitroGLYCERIN (NITROSTAT) 0.4 MG SL tablet Place 1 tablet (0.4 mg total) under the tongue every 5 (five) minutes as needed for chest pain. 12/03/12 08/28/16 Yes Renella Cunas, MD  omeprazole (PRILOSEC) 20 MG capsule Take 20 mg by mouth daily.     Yes Historical Provider, MD  XARELTO 20 MG TABS tablet TAKE 1 TABLET (20 MG TOTAL) BY MOUTH DAILY WITH SUPPER. 04/27/16  Yes Larey Dresser, MD  metFORMIN (GLUCOPHAGE) 500 MG tablet Take 500 mg by mouth  daily. 08/28/16   Historical Provider, MD    Family History Family History  Problem Relation Age of Onset  . Heart disease Father   . Heart attack Father   . Heart disease Mother   . Heart attack Paternal Grandfather   . Heart attack Paternal Uncle   . Colon cancer Neg Hx   . Stroke Neg Hx     Social History Social History  Substance Use Topics  . Smoking status: Former Smoker    Types: Cigarettes    Quit date: 08/05/1979  . Smokeless tobacco: Never Used  . Alcohol use 0.6 oz/week    1 Cans of beer per week     Comment: occ     Allergies   Review of patient's allergies indicates no known allergies.   Review of Systems Review of Systems  All other systems reviewed and are negative.    Physical Exam Updated Vital Signs BP 130/84 (BP Location: Left Arm)   Pulse 70   Resp 18   SpO2 99%   Physical Exam  Constitutional: He is oriented to person, place, and time. He appears well-developed and well-nourished. No distress.  HENT:  Head: Normocephalic and atraumatic.  Mouth/Throat: No oropharyngeal exudate.  Eyes: Conjunctivae and EOM are normal. Pupils are equal, round, and reactive to light. Right eye exhibits no discharge. Left eye exhibits no discharge. No scleral icterus.  Cardiovascular: Normal rate, regular rhythm, normal heart sounds and intact distal pulses.  Exam reveals no gallop and no friction rub.   No murmur heard. Pulmonary/Chest: Effort normal and breath sounds normal. No respiratory distress. He has no wheezes. He has no rales. He exhibits no tenderness.  Abdominal: Soft. He exhibits no distension. There is no tenderness. There is no guarding.  Musculoskeletal: Normal range of motion. He exhibits no edema.  Neurological: He is alert and oriented to person, place, and time.  Skin: Skin is warm and dry. No rash noted. He is not diaphoretic. No erythema. No pallor.  Psychiatric: He has a normal mood and affect. His behavior is normal.  Nursing note and  vitals reviewed.    ED Treatments / Results  Labs (all labs ordered are listed, but only abnormal results are displayed) Labs Reviewed  CBC - Abnormal; Notable for the following:       Result Value   HCT 38.6 (*)    MCHC 36.8 (*)    All other components within normal limits  BASIC METABOLIC PANEL  URINALYSIS, ROUTINE W REFLEX MICROSCOPIC (NOT AT [facility])  CBG MONITORING, ED    EKG  EKG Interpretation None       Radiology No results found.  Procedures Procedures (including critical care time)  Medications Ordered in ED Medications  sodium chloride 0.9 % bolus 1,000 mL (1,000 mLs Intravenous New Bag/Given 08/28/16 1440)     Initial Impression / Assessment and Plan / ED Course  I have reviewed the triage  vital signs and the nursing notes.  Pertinent labs & imaging results that were available during my care of the patient were reviewed by me and considered in my medical decision making (see chart for details).  Clinical Course    70 year old male with past medical history of CAD, HTN presents to the ED today for hyperglycemia. Patient was seen at PCP office prior to arrival for routine physical and was found to have severe hyperglycemia and elevated creatinine. On presentation to ED, patient appears well and is in no apparent distress. Patient overall states that he feels fine and has no active complaints. Other than polyuria and polydipsia for the last 2 weeks. Blood sugar in ED found to be 797. No evidence of DKA or HHS. No anion gap. Renal function now 1.75, trending down. Patient was given 20 units subcutaneous insulin at PCP office. Spoke with Dr. Virgina Jock who is patient's PCP who recommends giving patient several liters of IV fluids as well as IV insulin. If patient's blood sugars trending down and patient still feels well, Dr. Virgina Jock feels comfortable discharging home as he has a follow-up appointment tomorrow. Will administer these medications in the ED and trend his blood  sugars. If unable to control his blood glucose, will admit. However, if patient lab work does improve will DC him home. Patient signed out to Janetta Hora PA-C at shift change pending fluid and insulin administration and CBG monitoring. Pt has home insulin to take as prescribed by PCP.  Patient was discussed with and seen by Dr. Roderic Palau who agrees with the treatment plan.     Final Clinical Impressions(s) / ED Diagnoses   Final diagnoses:  Hyperglycemia    New Prescriptions New Prescriptions   No medications on file     Carlos Levering, PA-C 08/28/16 1544    Milton Ferguson, MD 09/01/16 0730

## 2016-08-28 NOTE — ED Notes (Signed)
Meter read HI for CBG

## 2016-08-28 NOTE — ED Provider Notes (Signed)
Patient signed out to me by S. Dowless PA-C. He is a 70 year old male with new onset hyperglycemia. Plan is to receive fluids and insulin here. If BG is <400 and trending down he may be discharged. He has an appointment with his PCP tomorrow and will follow up.   On recheck, CBG is 298. Will d/c with return precautions. Patient states he feels good. Patient is NAD, non-toxic, with stable VS. Patient is informed of clinical course, understands medical decision making process, and agrees with plan. Opportunity for questions provided and all questions answered. Return precautions given.   Recardo Evangelist, PA-C 08/28/16 [place], MD 08/31/16 [phone]

## 2016-08-28 NOTE — ED Triage Notes (Signed)
Patient came into PCP (Dr. Virgina Jock) office today & had blood work drawn. PCP sent patient here. Patient's blood sugar over 500, patient had lost 13 lbs 2-3 weeks, increased urination, Na 122, and creatinine 2.3 Patient given insulin office and sent him home prior to knowing patient's blood work results.. Dr. Virgina Jock hoping for patient to get a couple liters of fluids and IV insulin.

## 2016-08-28 NOTE — H&P (Signed)
  GLUCOSE              [HH] >525 mg/dl                  60-110     >   BUN                  [H]  37 mg/dl                    5-23   CREATININE           [H]  2.3 mg/dl                   0.3-1.5  eGFR Non-African American                             28.4  eGFR African American                             34.4   SODIUM               [LL] 122 mEq/L                   135-148     RES=RESULT VERIFIED AND REPORTED TO PHYSICIAN   POTASSIUM            [H]  5.4 mEq/L                   3.5-5.3   CHLORIDE                  86 mEq/L                    80-111   CO2                       25 mEq/L                    15-35   CALCIUM                   9.4 mg/dL                   7.0-10.5  Tests: (2) CBC (2000)   WBC                       6.90 K/uL                   4.10-10.90   LYM                       1.2 K/uL                    0.6-4.1 ! MID                       0.5 K/uL                    0.0-1.8   GRAN                      5.2 K/uL                    2.0-7.8   LYM%                        17.8 %                      10.0-58.5 ! MID%                      7.5 %                       0.1-24.0   GRAN%                     74.7                        37.0-92.0   RBC                  [L]  4.1 M/uL                    4.2-6.3   HGB                       13.7 g/dL                   12.0-18.0   HCT                       39.5 %                      37.0-51.0   MCV                       95.6 fL                     80.0-97.0   MCH                  [H]  33.2 pg                     26.0-32.0   MCHC                      34.7 g/dL                   31.0-36.0   PLT                       153 K/uL                    140-440  Tests: (3) TSH (3110)   TSH                       0.95 uIU/ml                 0.40-4.20     Third Generation hsTSH  Tests: (4) FREE T4 (3111)   FREE T4              [H]  1.9 ng/mL                   0.8-1.8    Electronically signed by John M Russo MD on 08/28/2016 at 1:24  PM  ________________________________________________________________________ I saw pt today for his Pre-DM and thyroid issues. Sugar was Hi and A1C > 10 c Polyuria, Poydipsia, Cotton mouth, Wt loss and anorexia. I started him on H 75/25 24 units given in   office and 20 units BID.  I also started him on Metformin.  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 He did not appear DKA and I got labs. I sent him home. He was comfortable coming back tomorrow at 930 for more education and discussion  Labs came back above and he is in Kidney failure - Cr 2.3 and Cr 1.0-1.2.  #s worse than expected.  I thought we would have a little more time. I called pt and sent him to [facility] ED. No Lasix or ACEi - until better Sugar too high to get accurate reading. needs lots of water. Needs ED and IVF - May even need admission.  Also probably needs IV Insulin. Hopefully can get 2-4 Liters IVF and bring Sugars down and we will see him tom am. Low Na. Start of Hyperosmolar. No DKA CBC fine. Thyroid Labs OK but needs to drop Synthroid 125 daily to 125 daily x 1/2 on Wednesdays - Did not get a chance to stress this finding yet as sugar is iggest issue. Lots of other labs sent and P.   Electronically signed by John M Russo MD on 08/28/2016 at 1:24 PM 

## 2016-08-28 NOTE — ED Notes (Signed)
Pt given urinal and mad aware of need for urine specimen. Pt states he just went to restroom.

## 2016-11-22 ENCOUNTER — Other Ambulatory Visit: Payer: Self-pay | Admitting: Cardiology

## 2017-01-09 ENCOUNTER — Other Ambulatory Visit: Payer: Self-pay | Admitting: Internal Medicine

## 2017-01-09 DIAGNOSIS — I714 Abdominal aortic aneurysm, without rupture, unspecified: Secondary | ICD-10-CM

## 2017-01-12 ENCOUNTER — Other Ambulatory Visit: Payer: Self-pay | Admitting: Cardiology

## 2017-01-18 ENCOUNTER — Other Ambulatory Visit: Payer: Self-pay | Admitting: Cardiology

## 2017-01-30 ENCOUNTER — Ambulatory Visit
Admission: RE | Admit: 2017-01-30 | Discharge: 2017-01-30 | Disposition: A | Discharge status: Home / Self Care | Payer: BC Managed Care – PPO | Source: Ambulatory Visit | Attending: Internal Medicine | Admitting: Internal Medicine

## 2017-01-30 DIAGNOSIS — I714 Abdominal aortic aneurysm, without rupture, unspecified: Secondary | ICD-10-CM

## 2017-02-01 ENCOUNTER — Other Ambulatory Visit: Payer: Self-pay | Admitting: Internal Medicine

## 2017-02-01 DIAGNOSIS — I713 Abdominal aortic aneurysm, ruptured, unspecified: Secondary | ICD-10-CM

## 2017-02-22 ENCOUNTER — Other Ambulatory Visit: Payer: Self-pay | Admitting: Cardiology

## 2017-03-01 ENCOUNTER — Telehealth: Payer: Self-pay

## 2017-03-01 NOTE — Telephone Encounter (Signed)
Sent notes to scheduling 

## 2017-03-21 ENCOUNTER — Other Ambulatory Visit: Payer: Self-pay | Admitting: Cardiology

## 2017-04-07 ENCOUNTER — Other Ambulatory Visit: Payer: Self-pay | Admitting: Cardiology

## 2017-04-08 ENCOUNTER — Encounter: Payer: Self-pay | Admitting: Cardiology

## 2017-04-09 ENCOUNTER — Encounter (HOSPITAL_COMMUNITY): Payer: Self-pay

## 2017-04-09 NOTE — Progress Notes (Signed)
Medical record request received from [facility] requesting recent note from referral. Last appointment with our office 02/10/2016. Note faxed. Copy of request scanned into patient's electronic medical record under media tab for reference.  Renee Pain, RN

## 2017-04-22 ENCOUNTER — Other Ambulatory Visit: Payer: Self-pay | Admitting: Cardiology

## 2017-04-24 ENCOUNTER — Other Ambulatory Visit: Payer: Self-pay | Admitting: Physician Assistant

## 2017-04-28 ENCOUNTER — Encounter: Payer: Self-pay | Admitting: Physician Assistant

## 2017-04-28 DIAGNOSIS — I714 Abdominal aortic aneurysm, without rupture, unspecified: Secondary | ICD-10-CM | POA: Insufficient documentation

## 2017-04-28 HISTORY — DX: Abdominal aortic aneurysm, without rupture, unspecified: I71.40

## 2017-04-28 HISTORY — DX: Abdominal aortic aneurysm, without rupture: I71.4

## 2017-04-28 NOTE — Progress Notes (Deleted)
Cardiology Office Note:    Date:  04/28/2017   ID:  Frederick Miles, DOB 02/22/1946, MRN [id number]  PCP:  Precious Reel, MD  Cardiologist:  Dr. Loralie Champagne   Electrophysiologist:  n/a  Referring MD: Shon Baton, MD   No chief complaint on file. ***  History of Present Illness:    Frederick Miles is a 71 y.o. male with a hx of  CAD s/p NSTEMI in 12/07 with DES to LAD, PAF.  Last seen by Dr. Loralie Champagne in 2/17.  ***  Prior CV studies:   The following studies were reviewed today:  Aorta US 1/18 4.2 cm AAA  Myoview 7/16 Low risk stress nuclear study with a small, medium intensity, fixed inferior defect consistent with diaphragmatic attenuation; no ischemia; EF 66 and normal wall motion.  Echo 12/15 Mod focal basal septal hypertrophy, EF 65-70, no RWMA, Gr 1 DD, mild AI, dilated ascending aorta (44 mm), trivial TR  LHC 11/2006 1. LV: 133/0/17. EF 65% without regional wall motion abnormality. 2. No aortic stenosis or mitral regurgitation. 3. Left main: Angiographically normal. 4. LAD: 95% stenosis of the proximal vessel which was stented to no  residual. There is a single moderate-sized diagonal. This vessel  has a 50% stenosis in its midsection. 5. Circumflex: Large vessel giving rise to a single branching obtuse  marginal. It has only minor luminal irregularities. 6. RCA: Large, dominant vessel. It is angiographically normal. PCI: 3 x 12 mm Taxus DES to pLAD  Past Medical History:  Diagnosis Date  . Bladder cancer ([place]) 2006  . Cervicalgia   . Coronary atherosclerosis of native coronary artery   . Esophageal reflux   . Esophageal stricture   . Heart murmur   . History of cardiovascular stress test    Lexiscan Myoview 7/16: EF 66%, diaphragmatic attenuation, no ischemia, low risk  . Hx of adenomatous colonic polyps   . Hyperlipidemia   . Hypertension   . Kidney stone on left side   . Myocardial infarct ([place])   . Unspecified  hypothyroidism   1. Hyperlipidemia 2. CAD: NSTEMI in 12/07 with DES to LAD, also had 50% stenosis in D1. EF was preserved. 10/08 had Cardiolite with no ischemia.  3. HTN 4. GERD 5. Colon polyps 6. Bladder cancer in 2006 7. Nephrolithiasis 8. H/o rheumatic fever  Past Surgical History:  Procedure Laterality Date  . CORONARY ANGIOPLASTY WITH STENT PLACEMENT  2007  . CYSTOSCOPY/RETROGRADE/URETEROSCOPY/STONE EXTRACTION WITH BASKET  08/08/2012   Procedure: CYSTOSCOPY/RETROGRADE/URETEROSCOPY/STONE EXTRACTION WITH BASKET;  Surgeon: Malka So, MD;  Location: [facility];  Service: Urology;  Laterality: Left;  Left Ureteroscopy with Stone Extraction  . SKIN TAG REMOVAL  07/09/06   right anterior thigh  . TONSILLECTOMY AND ADENOIDECTOMY    . TRANSURETHRAL RESECTION OF BLADDER  07/09/06   tumor 2-5 cm    Current Medications: No outpatient prescriptions have been marked as taking for the 04/29/17 encounter (Appointment) with Liliane Shi, PA-C.     Allergies:   Patient has no known allergies.   Social History   Social History  . Marital status: Married    Spouse name: N/A  . Number of children: 2  . Years of education: N/A   Occupational History  .  [place] History Main Topics  . Smoking status: Former Smoker    Types: Cigarettes    Quit date: 08/05/1979  . Smokeless tobacco: Never Used  . Alcohol use 0.6 oz/week  1 Cans of beer per week     Comment: occ  . Drug use: No  . Sexual activity: Yes   Other Topics Concern  . Not on file   Social History Narrative   Daily caffeine      Family History  Problem Relation Age of Onset  . Heart disease Father   . Heart attack Father   . Heart disease Mother   . Heart attack Paternal Grandfather   . Heart attack Paternal Uncle   . Colon cancer Neg Hx   . Stroke Neg Hx      ROS:   Please see the history of present illness.    ROS All other systems reviewed and are negative.   EKGs/Labs/Other Test  Reviewed:    EKG:  EKG is *** ordered today.  The ekg ordered today demonstrates ***  Recent Labs: 08/28/2016: BUN 36; Creatinine, Ser 1.75; Hemoglobin 14.2; Platelets 157; Potassium 4.6; Sodium 128   Recent Lipid Panel    Component Value Date/Time   CHOL 123 03/03/2008 0948   TRIG 150 (H) 03/03/2008 0948   HDL 27.6 (L) 03/03/2008 0948   CHOLHDL 4.5 CALC 03/03/2008 0948   VLDL 30 03/03/2008 0948   LDLCALC 65 03/03/2008 0948     Physical Exam:    VS:  There were no vitals taken for this visit.    Wt Readings from Last 3 Encounters:  02/10/16 196 lb (88.9 kg)  07/19/15 198 lb (89.8 kg)  07/05/15 198 lb (89.8 kg)     ***Physical Exam  ASSESSMENT:    No diagnosis found. PLAN:    In order of problems listed above:  No diagnosis found.***  Dispo:  No Follow-up on file.   Medication Adjustments/Labs and Tests Ordered: Current medicines are reviewed at length with the patient today.  Concerns regarding medicines are outlined above.  Orders placed this visit:  No orders of the defined types were placed in this encounter.  Medication changes this visit: No orders of the defined types were placed in this encounter.   Signed, Richardson Dopp, PA-C  04/28/2017 9:37 PM    [facility] [address], [place], [place]  [address] Phone: [phone]; Fax: [phone]

## 2017-04-29 ENCOUNTER — Ambulatory Visit: Payer: BC Managed Care – PPO | Admitting: Physician Assistant
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
## 2017-04-30 ENCOUNTER — Encounter (INDEPENDENT_AMBULATORY_CARE_PROVIDER_SITE_OTHER): Payer: Self-pay

## 2017-04-30 ENCOUNTER — Ambulatory Visit (INDEPENDENT_AMBULATORY_CARE_PROVIDER_SITE_OTHER): Payer: BC Managed Care – PPO | Admitting: Physician Assistant

## 2017-04-30 ENCOUNTER — Encounter: Payer: Self-pay | Admitting: Physician Assistant

## 2017-04-30 VITALS — BP 118/70 | HR 58 | Ht 70.0 in | Wt 185.1 lb

## 2017-04-30 DIAGNOSIS — I10 Essential (primary) hypertension: Secondary | ICD-10-CM | POA: Diagnosis not present

## 2017-04-30 DIAGNOSIS — I480 Paroxysmal atrial fibrillation: Secondary | ICD-10-CM | POA: Diagnosis not present

## 2017-04-30 DIAGNOSIS — I714 Abdominal aortic aneurysm, without rupture, unspecified: Secondary | ICD-10-CM

## 2017-04-30 DIAGNOSIS — E785 Hyperlipidemia, unspecified: Secondary | ICD-10-CM | POA: Diagnosis not present

## 2017-04-30 DIAGNOSIS — I2510 Atherosclerotic heart disease of native coronary artery without angina pectoris: Secondary | ICD-10-CM

## 2017-04-30 DIAGNOSIS — E119 Type 2 diabetes mellitus without complications: Secondary | ICD-10-CM

## 2017-04-30 MED ORDER — LISINOPRIL 20 MG PO TABS: 20.0000 mg | ORAL_TABLET | Freq: Every day | ORAL | 3 refills | Status: DC

## 2017-04-30 NOTE — Patient Instructions (Addendum)
Medication Instructions:  A NEW RX HAS BEEN SENT IN FOR LISINOPRIL 20 MG TABLET ONCE A DAY  Labwork: NONE ORDERED  Testing/Procedures: NONE ORDERED  Follow-Up: Your physician wants you to follow-up in: [place] END. You will receive a reminder letter in the mail two months in advance. If you don't receive a letter, please call our office to schedule the follow-up appointment.  Any Other Special Instructions Will Be Listed Below (If Applicable).  If you need a refill on your cardiac medications before your next appointment, please call your pharmacy.

## 2017-05-01 ENCOUNTER — Other Ambulatory Visit: Payer: Self-pay | Admitting: *Deleted

## 2017-05-01 MED ORDER — MAGNESIUM OXIDE 400 MG PO TABS: 1.0000 | ORAL_TABLET | Freq: Every day | ORAL | 3 refills | Status: DC

## 2017-05-20 ENCOUNTER — Other Ambulatory Visit: Payer: Self-pay | Admitting: Cardiology

## 2017-07-15 ENCOUNTER — Other Ambulatory Visit: Payer: Self-pay | Admitting: Internal Medicine

## 2017-07-15 DIAGNOSIS — I714 Abdominal aortic aneurysm, without rupture, unspecified: Secondary | ICD-10-CM

## 2017-07-15 DIAGNOSIS — R748 Abnormal levels of other serum enzymes: Secondary | ICD-10-CM

## 2017-07-31 ENCOUNTER — Ambulatory Visit
Admission: RE | Admit: 2017-07-31 | Discharge: 2017-07-31 | Disposition: A | Discharge status: Home / Self Care | Payer: BC Managed Care – PPO | Source: Ambulatory Visit | Attending: Internal Medicine | Admitting: Internal Medicine

## 2017-07-31 DIAGNOSIS — I714 Abdominal aortic aneurysm, without rupture, unspecified: Secondary | ICD-10-CM

## 2017-07-31 DIAGNOSIS — R748 Abnormal levels of other serum enzymes: Secondary | ICD-10-CM

## 2017-08-02 ENCOUNTER — Other Ambulatory Visit: Payer: Self-pay | Admitting: Cardiology

## 2017-08-05 NOTE — Telephone Encounter (Signed)
Called PCP to see if they have checked pt's renal function in the last year. Dr Keane Police office checked SCr in Feb 2018 - SCr 1.2, BUN 17. CrCl 90mL/min, ok to continue Xarelto 20mg  daily.

## 2018-01-16 ENCOUNTER — Other Ambulatory Visit: Payer: Self-pay | Admitting: Cardiology

## 2018-01-16 NOTE — Telephone Encounter (Addendum)
Pt last saw Aniwa weaver, [place] on 04/30/17, last labs 08/28/16 Creat 1.75.  Called primary MD office, requested most recent labs be faxed to our office.  Age 72, weight 84kg, will await labwork to calculate CrCl to make sure pt is on appropriate dosage of Xarelto.  Labs received from PCP, Creat 1.2 on 02/21/17, age 43, weight 84kg, CrCl 68.06, based on CrCl pt is on appropriate dosage of Xarelto 20mg  QD, will refill rx.

## 2018-04-14 ENCOUNTER — Other Ambulatory Visit: Payer: Self-pay | Admitting: Physician Assistant

## 2018-05-10 ENCOUNTER — Other Ambulatory Visit: Payer: Self-pay | Admitting: Physician Assistant

## 2018-05-13 ENCOUNTER — Other Ambulatory Visit: Payer: Self-pay | Admitting: Physician Assistant

## 2018-05-17 ENCOUNTER — Other Ambulatory Visit: Payer: Self-pay | Admitting: Physician Assistant

## 2018-06-10 DIAGNOSIS — E119 Type 2 diabetes mellitus without complications: Secondary | ICD-10-CM | POA: Diagnosis not present

## 2018-06-10 DIAGNOSIS — Z6825 Body mass index (BMI) 25.0-25.9, adult: Secondary | ICD-10-CM | POA: Diagnosis not present

## 2018-06-10 DIAGNOSIS — Z794 Long term (current) use of insulin: Secondary | ICD-10-CM | POA: Diagnosis not present

## 2018-06-10 DIAGNOSIS — I10 Essential (primary) hypertension: Secondary | ICD-10-CM | POA: Diagnosis not present

## 2018-06-21 ENCOUNTER — Other Ambulatory Visit: Payer: Self-pay | Admitting: Physician Assistant

## 2018-06-27 ENCOUNTER — Encounter: Payer: Self-pay | Admitting: Internal Medicine

## 2018-06-27 ENCOUNTER — Ambulatory Visit: Payer: BC Managed Care – PPO | Admitting: Internal Medicine

## 2018-06-27 VITALS — BP 126/76 | HR 62 | Ht 70.0 in | Wt 173.5 lb

## 2018-06-27 DIAGNOSIS — E785 Hyperlipidemia, unspecified: Secondary | ICD-10-CM

## 2018-06-27 DIAGNOSIS — E1159 Type 2 diabetes mellitus with other circulatory complications: Secondary | ICD-10-CM

## 2018-06-27 DIAGNOSIS — I739 Peripheral vascular disease, unspecified: Secondary | ICD-10-CM | POA: Diagnosis not present

## 2018-06-27 DIAGNOSIS — E119 Type 2 diabetes mellitus without complications: Secondary | ICD-10-CM | POA: Insufficient documentation

## 2018-06-27 DIAGNOSIS — I714 Abdominal aortic aneurysm, without rupture, unspecified: Secondary | ICD-10-CM

## 2018-06-27 DIAGNOSIS — I2510 Atherosclerotic heart disease of native coronary artery without angina pectoris: Secondary | ICD-10-CM | POA: Diagnosis not present

## 2018-06-27 DIAGNOSIS — I480 Paroxysmal atrial fibrillation: Secondary | ICD-10-CM | POA: Diagnosis not present

## 2018-06-27 DIAGNOSIS — Z794 Long term (current) use of insulin: Secondary | ICD-10-CM | POA: Diagnosis not present

## 2018-06-27 DIAGNOSIS — I10 Essential (primary) hypertension: Secondary | ICD-10-CM

## 2018-06-27 NOTE — Addendum Note (Signed)
Addended by: Frederik Schmidt on: 06/27/2018 04:50 PM   Modules accepted: Orders

## 2018-06-27 NOTE — Progress Notes (Addendum)
Follow-up Outpatient Visit Date: 06/27/2018  Primary Care Provider: Shon Baton, MD [place] [address]  Chief Complaint: Follow-up CAD and PAF  HPI:  [name] is a 72 y.o. year-old male with history of coronary artery disease is post NSTEMI (12/07) with DES to the LAD, paroxysmal atrial fibrillation, PAD/AAA, HTN, HLD, bladder cancer, and remote rheumatic fever who presents for follow-up of CAD and atrial fibrillation.  He was previously followed in our office by Dr. Aundra Dubin and was last seen by Richardson Dopp, PA, a year ago.  He was doing well at that time.  Today, [name] is without complaints.  He continues to walk at least 5 days a week without any difficulty.  He has not had any chest pain, shortness of breath, palpitations, lightheadedness, orthopnea, or edema.  He notes that his symptoms leading up to an STEMI in 2007 were marked exhaustion and ultimately burning chest pain.  He has not noticed anything reminiscent of this.  He is tolerating his medications well including anticoagulation with rivaroxaban.  He denies bleeding.  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  --------------------------------------------------------------------------------------------------  Past Medical History:  Diagnosis Date  . AAA (abdominal aortic aneurysm) without rupture ([place]) 04/28/2017   Aorta US 1/18: 4.2 cm AAA  . Bladder cancer ([place]) 2006  . Cervicalgia   . Coronary atherosclerosis of native coronary artery   . Esophageal reflux   . Esophageal stricture   . Heart murmur   . History of cardiovascular stress test    Lexiscan Myoview 7/16: EF 66%, diaphragmatic attenuation, no ischemia, low risk  . Hx of adenomatous colonic polyps   . Hyperlipidemia   . Hypertension   . Kidney stone on left side   . Myocardial infarct ([place])   . Unspecified hypothyroidism    Past Surgical History:    Procedure Laterality Date  . CORONARY ANGIOPLASTY WITH STENT PLACEMENT  2007  . CYSTOSCOPY/RETROGRADE/URETEROSCOPY/STONE EXTRACTION WITH BASKET  08/08/2012   Procedure: CYSTOSCOPY/RETROGRADE/URETEROSCOPY/STONE EXTRACTION WITH BASKET;  Surgeon: Malka So, MD;  Location: [facility];  Service: Urology;  Laterality: Left;  Left Ureteroscopy with Stone Extraction  . SKIN TAG REMOVAL  07/09/06   right anterior thigh  . TONSILLECTOMY AND ADENOIDECTOMY    . TRANSURETHRAL RESECTION OF BLADDER  07/09/06   tumor 2-5 cm    Current Meds  Medication Sig  . atorvastatin (LIPITOR) 40 MG tablet Take 40 mg by mouth daily.    Marland Kitchen diltiazem (CARDIZEM CD) 120 MG 24 hr capsule Take 1 capsule (120 mg total) by mouth daily. Please make overdue yearly appt with Dr. Saunders Revel before anymore refills. 2nd attempt  . empagliflozin (JARDIANCE) 25 MG TABS tablet Take 1 tablet by mouth daily.  . hydrochlorothiazide (HYDRODIURIL) 25 MG tablet Take 1 tablet (25 mg total) by mouth daily. Please make overdue yearly appt with Doctor before anymore refills. 2nd attempt  . Insulin Glargine (BASAGLAR KWIKPEN [place]) Inject into the skin as directed.  . insulin lispro (HUMALOG) 100 UNIT/ML injection Inject into the skin as directed.  Marland Kitchen levothyroxine (SYNTHROID, LEVOTHROID) 125 MCG tablet Take 125 mcg by mouth daily.    Marland Kitchen lisinopril (PRINIVIL,ZESTRIL) 20 MG tablet TAKE 1 TABLET BY MOUTH EVERY DAY  . Magnesium Oxide 400 (240 Mg) MG TABS Take 1 tablet (400 mg total) by mouth daily. Need to contact office for appointment for full refill amount  . metFORMIN (GLUCOPHAGE) 500  MG tablet Take 500 mg by mouth daily.  . nitroGLYCERIN (NITROSTAT) 0.4 MG SL tablet Place 1 tablet (0.4 mg total) under the tongue every 5 (five) minutes as needed for chest pain.  Marland Kitchen omeprazole (PRILOSEC) 20 MG capsule Take 20 mg by mouth daily.    Alveda Reasons 20 MG TABS tablet TAKE 1 TABLET BY MOUTH DAILY WITH SUPPER    Allergies: Patient has no known allergies.  Social History    Tobacco Use  . Smoking status: Former Smoker    Types: Cigarettes    Last attempt to quit: 08/05/1979    Years since quitting: 38.9  . Smokeless tobacco: Never Used  Substance Use Topics  . Alcohol use: Yes    Alcohol/week: 0.6 oz    Types: 1 Cans of beer per week    Comment: occ  . Drug use: No    Family History  Problem Relation Age of Onset  . Heart disease Father   . Heart attack Father   . Heart disease Mother   . Heart attack Paternal Grandfather   . Heart attack Paternal Uncle   . Colon cancer Neg Hx   . Stroke Neg Hx     Review of Systems: A 12-system review of systems was performed and was negative except as noted in the HPI.  --------------------------------------------------------------------------------------------------  Physical Exam: BP 126/76   Pulse 62   Ht 5\' 10"  (1.778 m)   Wt 173 lb 8 oz (78.7 kg)   SpO2 98%   BMI 24.89 kg/m   General: NAD. HEENT: No conjunctival pallor or scleral icterus. Moist mucous membranes.  OP clear. Neck: Supple without lymphadenopathy, thyromegaly, JVD, or HJR. No carotid bruit. Lungs: Normal work of breathing. Clear to auscultation bilaterally without wheezes or crackles. Heart: Regular rate and rhythm without murmurs, rubs, or gallops. Non-displaced PMI. Abd: Bowel sounds present. Soft, NT/ND without hepatosplenomegaly Ext: No lower extremity edema. Radial, PT, and DP pulses are 2+ bilaterally. Skin: Warm and dry without rash.  EKG: Sinus bradycardia (ventricular rate 57 bpm).  Otherwise, no significant abnormalities.  Lab Results  Component Value Date   WBC 6.2 08/28/2016   HGB 14.2 08/28/2016   HCT 38.6 (L) 08/28/2016   MCV 87.5 08/28/2016   PLT 157 08/28/2016    Lab Results  Component Value Date   NA 128 (L) 08/28/2016   K 4.6 08/28/2016   CL 92 (L) 08/28/2016   CO2 24 08/28/2016   BUN 36 (H) 08/28/2016   CREATININE 1.75 (H) 08/28/2016   GLUCOSE 797 (HH) 08/28/2016   ALT 35 04/17/2010    Lab  Results  Component Value Date   CHOL 123 03/03/2008   HDL 27.6 (L) 03/03/2008   LDLCALC 65 03/03/2008   TRIG 150 (H) 03/03/2008   CHOLHDL 4.5 CALC 03/03/2008   Outside labs (02/24/18): Total cholesterol 118, HDL 39, LDL 63, A1C 7.2%, creatinine 1.3.  --------------------------------------------------------------------------------------------------  ASSESSMENT AND PLAN: Coronary artery disease without angina No symptoms to suggest worsening coronary insufficiency.  Continue aggressive secondary prevention including lipid and blood pressure control.  Proximal atrial fibrillation Sinus rhythm today.  No symptoms to suggest recurrence reported by patient.  Continue indefinite anticoagulation with rivaroxaban, as well as diltiazem for rate control.  Peripheral vascular disease and AAA No symptoms reported by patient.  Prior imaging reviewed after today's visit, which comments in several radiology reports about aortobiiliac bypass.  I spoke with Mr. Mcburney by phone after today's visit; he denies ever having aortic surgery.  He  states that his mildly enlarged aorta is being followed by Dr. Virgina Jock.  Hyperlipidemia LDL at goal (less than 70).  Continue atorvastatin 40 mg daily.  Hypertension Blood pressure well controlled.  Continue current medications.  Type 2 diabetes mellitus Recent diagnosis for Mr. Silsby.  I agree with current medications including empagliflozin given its beneficial cardiac profile.  I will defer ongoing management to Dr. Virgina Jock.  Follow-up: Return to clinic in 1 year.  Nelva Bush, MD 06/27/2018 11:41 AM

## 2018-06-27 NOTE — Patient Instructions (Addendum)
Medication Instructions:  Your physician recommends that you continue on your current medications as directed. Please refer to the Current Medication list given to you today.  -- If you need a refill on your cardiac medications before your next appointment, please call your pharmacy. --  Labwork: None ordered  Testing/Procedures: None ordered  Follow-Up: Your physician wants you to follow-up in: 1 year with Dr. End.    You will receive a reminder letter in the mail two months in advance. If you don't receive a letter, please call our office to schedule the follow-up appointment.  Thank you for choosing [facility]!!    Any Other Special Instructions Will Be Listed Below (If Applicable).         

## 2018-06-30 DIAGNOSIS — E119 Type 2 diabetes mellitus without complications: Secondary | ICD-10-CM | POA: Diagnosis not present

## 2018-06-30 DIAGNOSIS — I480 Paroxysmal atrial fibrillation: Secondary | ICD-10-CM | POA: Diagnosis not present

## 2018-06-30 DIAGNOSIS — E038 Other specified hypothyroidism: Secondary | ICD-10-CM | POA: Diagnosis not present

## 2018-06-30 DIAGNOSIS — I10 Essential (primary) hypertension: Secondary | ICD-10-CM | POA: Diagnosis not present

## 2018-06-30 DIAGNOSIS — E7849 Other hyperlipidemia: Secondary | ICD-10-CM | POA: Diagnosis not present

## 2018-06-30 DIAGNOSIS — I714 Abdominal aortic aneurysm, without rupture: Secondary | ICD-10-CM | POA: Diagnosis not present

## 2018-06-30 DIAGNOSIS — Z794 Long term (current) use of insulin: Secondary | ICD-10-CM | POA: Diagnosis not present

## 2018-06-30 DIAGNOSIS — I2510 Atherosclerotic heart disease of native coronary artery without angina pectoris: Secondary | ICD-10-CM | POA: Diagnosis not present

## 2018-06-30 DIAGNOSIS — Z6825 Body mass index (BMI) 25.0-25.9, adult: Secondary | ICD-10-CM | POA: Diagnosis not present

## 2018-07-01 ENCOUNTER — Other Ambulatory Visit: Payer: Self-pay | Admitting: Internal Medicine

## 2018-07-01 DIAGNOSIS — I714 Abdominal aortic aneurysm, without rupture, unspecified: Secondary | ICD-10-CM

## 2018-07-09 DIAGNOSIS — I10 Essential (primary) hypertension: Secondary | ICD-10-CM | POA: Diagnosis not present

## 2018-07-09 DIAGNOSIS — Z794 Long term (current) use of insulin: Secondary | ICD-10-CM | POA: Diagnosis not present

## 2018-07-09 DIAGNOSIS — Z6825 Body mass index (BMI) 25.0-25.9, adult: Secondary | ICD-10-CM | POA: Diagnosis not present

## 2018-07-09 DIAGNOSIS — E119 Type 2 diabetes mellitus without complications: Secondary | ICD-10-CM | POA: Diagnosis not present

## 2018-07-13 ENCOUNTER — Other Ambulatory Visit: Payer: Self-pay | Admitting: Physician Assistant

## 2018-07-14 ENCOUNTER — Ambulatory Visit
Admission: RE | Admit: 2018-07-14 | Discharge: 2018-07-14 | Disposition: A | Discharge status: Home / Self Care | Payer: Medicare Other | Source: Ambulatory Visit | Attending: Internal Medicine | Admitting: Internal Medicine

## 2018-07-14 DIAGNOSIS — I714 Abdominal aortic aneurysm, without rupture, unspecified: Secondary | ICD-10-CM

## 2018-07-21 ENCOUNTER — Other Ambulatory Visit: Payer: Self-pay | Admitting: Physician Assistant

## 2018-07-25 ENCOUNTER — Other Ambulatory Visit: Payer: Self-pay | Admitting: Cardiology

## 2018-07-31 ENCOUNTER — Other Ambulatory Visit: Payer: Self-pay | Admitting: Internal Medicine

## 2018-07-31 DIAGNOSIS — I714 Abdominal aortic aneurysm, without rupture, unspecified: Secondary | ICD-10-CM

## 2018-08-05 ENCOUNTER — Ambulatory Visit
Admission: RE | Admit: 2018-08-05 | Discharge: 2018-08-05 | Disposition: A | Discharge status: Home / Self Care | Payer: Medicare Other | Source: Ambulatory Visit | Attending: Internal Medicine | Admitting: Internal Medicine

## 2018-08-05 DIAGNOSIS — I714 Abdominal aortic aneurysm, without rupture, unspecified: Secondary | ICD-10-CM

## 2018-08-05 MED ORDER — IOPAMIDOL (ISOVUE-370) INJECTION 76%
75.0000 mL | Freq: Once | INTRAVENOUS | Status: AC | PRN
  Administered 2018-08-05: 75 mL via INTRAVENOUS

## 2018-08-11 ENCOUNTER — Encounter: Payer: Self-pay | Admitting: Vascular Surgery

## 2018-08-15 ENCOUNTER — Ambulatory Visit (INDEPENDENT_AMBULATORY_CARE_PROVIDER_SITE_OTHER): Payer: Medicare Other | Admitting: Vascular Surgery

## 2018-08-15 ENCOUNTER — Other Ambulatory Visit: Payer: Self-pay

## 2018-08-15 ENCOUNTER — Encounter: Payer: Self-pay | Admitting: Vascular Surgery

## 2018-08-15 VITALS — BP 121/81 | HR 62 | Temp 97.1°F | Resp 16 | Ht 70.0 in | Wt 171.0 lb

## 2018-08-15 DIAGNOSIS — I714 Abdominal aortic aneurysm, without rupture, unspecified: Secondary | ICD-10-CM

## 2018-08-15 NOTE — Progress Notes (Signed)
Patient ID: Frederick Miles, male   DOB: 1946-03-25, 72 y.o.   MRN: [id number]  Reason for Consult: New Patient (Initial Visit) (AAA)   Referred by Shon Baton, MD  Subjective:     HPI:  Frederick Miles is a 72 y.o. male presents for evaluation of his abdominal aortic aneurysm.  He is known about this for several years and has been followed with CT scan.  He does not have any new back or abdominal pain.  He does remain quite active both playing golf and fishing.  He does not have any new issues related to his aneurysm.  CT scan was performed prior to this visit for evaluation.  He walks without limitations does not have tissue loss or ulceration bilateral lower extremities.  Does have a history of CAD which is followed closely.  He also has a history of diabetes but is controlled better he states now.  He denies any history of stroke, amaurosis, TIA.  Past Medical History:  Diagnosis Date  . AAA (abdominal aortic aneurysm) without rupture ([place]) 04/28/2017   Aorta US 1/18: 4.2 cm AAA  . Bladder cancer ([place]) 2006  . Cervicalgia   . Coronary atherosclerosis of native coronary artery   . Esophageal reflux   . Esophageal stricture   . Heart murmur   . History of cardiovascular stress test    Lexiscan Myoview 7/16: EF 66%, diaphragmatic attenuation, no ischemia, low risk  . Hx of adenomatous colonic polyps   . Hyperlipidemia   . Hypertension   . Kidney stone on left side   . Myocardial infarct ([place])   . Unspecified hypothyroidism    Family History  Problem Relation Age of Onset  . Heart disease Father   . Heart attack Father   . Heart disease Mother   . Heart attack Paternal Grandfather   . Heart attack Paternal Uncle   . Colon cancer Neg Hx   . Stroke Neg Hx    Past Surgical History:  Procedure Laterality Date  . CORONARY ANGIOPLASTY WITH STENT PLACEMENT  2007  . CYSTOSCOPY/RETROGRADE/URETEROSCOPY/STONE EXTRACTION WITH BASKET  08/08/2012   Procedure:  CYSTOSCOPY/RETROGRADE/URETEROSCOPY/STONE EXTRACTION WITH BASKET;  Surgeon: Malka So, MD;  Location: [facility];  Service: Urology;  Laterality: Left;  Left Ureteroscopy with Stone Extraction  . SKIN TAG REMOVAL  07/09/06   right anterior thigh  . TONSILLECTOMY AND ADENOIDECTOMY    . TRANSURETHRAL RESECTION OF BLADDER  07/09/06   tumor 2-5 cm    Short Social History:  Social History   Tobacco Use  . Smoking status: Former Smoker    Types: Cigarettes    Last attempt to quit: 08/05/1979    Years since quitting: 39.0  . Smokeless tobacco: Never Used  Substance Use Topics  . Alcohol use: Yes    Alcohol/week: 5.0 standard drinks    Types: 1 Cans of beer, 4 Glasses of wine per week    No Known Allergies  Current Outpatient Medications  Medication Sig Dispense Refill  . atorvastatin (LIPITOR) 40 MG tablet Take 40 mg by mouth daily.      Marland Kitchen diltiazem (CARDIZEM CD) 120 MG 24 hr capsule Take 1 capsule (120 mg total) by mouth daily. Please make overdue yearly appt with Dr. Saunders Revel before anymore refills. 2nd attempt 15 capsule 9  . empagliflozin (JARDIANCE) 25 MG TABS tablet Take 1 tablet by mouth daily.    . hydrochlorothiazide (HYDRODIURIL) 25 MG tablet Take 1 tablet (25 mg total) by mouth daily.  90 tablet 3  . insulin aspart (NOVOLOG FLEXPEN) 100 UNIT/ML FlexPen Inject 3 Units into the skin 3 (three) times daily with meals. With each meal plus correction-sliding scale.    . Insulin Glargine (BASAGLAR KWIKPEN [place]) Inject into the skin as directed.    . insulin lispro (HUMALOG) 100 UNIT/ML injection Inject into the skin as directed.    Marland Kitchen levothyroxine (SYNTHROID, LEVOTHROID) 125 MCG tablet Take 125 mcg by mouth daily.      Marland Kitchen lisinopril (PRINIVIL,ZESTRIL) 20 MG tablet TAKE 1 TABLET BY MOUTH EVERY DAY 90 tablet 3  . Magnesium Oxide 400 (240 Mg) MG TABS Take 1 tablet (400 mg total) by mouth daily. Need to contact office for appointment for full refill amount 30 tablet 0  . metFORMIN (GLUCOPHAGE) 500 MG  tablet Take 500 mg by mouth daily.    Marland Kitchen omeprazole (PRILOSEC OTC) 20 MG tablet Take 20 mg by mouth daily.    Marland Kitchen omeprazole (PRILOSEC) 20 MG capsule Take 20 mg by mouth daily.      Alveda Reasons 20 MG TABS tablet TAKE 1 TABLET BY MOUTH DAILY WITH SUPPER 30 tablet 5  . furosemide (LASIX) 20 MG tablet Take 20 mg by mouth.    . nitroGLYCERIN (NITROSTAT) 0.4 MG SL tablet Place 1 tablet (0.4 mg total) under the tongue every 5 (five) minutes as needed for chest pain. 25 tablet 6   No current facility-administered medications for this visit.     Review of Systems  HENT: HENT negative.  Eyes: Eyes negative.  Respiratory: Respiratory negative.  Cardiovascular: Cardiovascular negative.  GI: Gastrointestinal negative.  Musculoskeletal: Musculoskeletal negative.  Skin: Skin negative.  Neurological: Neurological negative. Hematologic: Hematologic/lymphatic negative.  Psychiatric: Psychiatric negative.        Objective:  Objective   Vitals:   08/15/18 1244  BP: 121/81  Pulse: 62  Resp: 16  Temp: (!) 97.1 F (36.2 C)  TempSrc: Oral  SpO2: 100%  Weight: 171 lb (77.6 kg)  Height: 5\' 10"  (1.778 m)   Body mass index is 24.54 kg/m.  Physical Exam  Constitutional: He is oriented to person, place, and time. He appears well-developed.  HENT:  Head: Normocephalic.  Eyes: Pupils are equal, round, and reactive to light.  Neck: Normal range of motion.  Cardiovascular: Normal rate.  Pulses:      Carotid pulses are 2+ on the right side, and 2+ on the left side.      Radial pulses are 2+ on the right side, and 2+ on the left side.       Popliteal pulses are 2+ on the right side, and 2+ on the left side.       Dorsalis pedis pulses are 2+ on the right side, and 2+ on the left side.       Posterior tibial pulses are 2+ on the right side, and 2+ on the left side.  Pulmonary/Chest: Effort normal.  Abdominal: Soft. He exhibits no mass.  Musculoskeletal: Normal range of motion. He exhibits no edema.    Neurological: He is alert and oriented to person, place, and time.  Skin: Skin is warm and dry.  Psychiatric: He has a normal mood and affect. His behavior is normal. Judgment and thought content normal.    Data: I personally reviewed his CT scans with the patient and his wife going back a total of 12 years to 2007 where the aneurysm appears somewhat abnormal as though it is possibly a thrombosed saccular aneurysm of his infrarenal aorta  and right common iliac artery versus thrombosed dissection plane.  He has remained stable in size over the entire course of the time at 3.1 cm greatest diameter.     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  Either way it is been stable for many years.  We can follow with duplex given his size and the location of the aneurysm and we will do this in 2 years.  I discussed with him the signs and symptoms of rupture and the need to follow this long-term.  He demonstrates very good understanding.  I also discussed with him to avoid straining and to avoid fluoroquinolone antibiotics which he also understands well.     Waynetta Sandy MD [facility]

## 2018-08-20 DIAGNOSIS — E119 Type 2 diabetes mellitus without complications: Secondary | ICD-10-CM | POA: Diagnosis not present

## 2018-08-20 DIAGNOSIS — Z6825 Body mass index (BMI) 25.0-25.9, adult: Secondary | ICD-10-CM | POA: Diagnosis not present

## 2018-08-20 DIAGNOSIS — I10 Essential (primary) hypertension: Secondary | ICD-10-CM | POA: Diagnosis not present

## 2018-08-20 DIAGNOSIS — Z794 Long term (current) use of insulin: Secondary | ICD-10-CM | POA: Diagnosis not present

## 2018-10-14 ENCOUNTER — Telehealth: Payer: Self-pay

## 2018-10-14 NOTE — Telephone Encounter (Signed)
Per Dr. Enrique Sack in June 2019, this patient is to come back in 1 year for follow-up.  Called to discuss switch and recall with patient. Left message to call back.

## 2018-10-14 NOTE — Telephone Encounter (Signed)
-----   Message from Elsie Stain, MD sent at 10/14/2018 12:32 PM EDT ----- Thank you very much Ronalee Belts  That would be terrific  I will let him know  Pat  ----- Message ----- From: Sherren Mocha, MD Sent: 10/14/2018   8:57 AM EDT To: Liliane Shi, PA-C, Elsie Stain, MD, #  I'd be happy to see him Fraser Din. I generally will manage patients with Richardson Dopp who is my PA since my office access is pretty limited. If he's ok with that I'll get something arranged.   Valetta Fuller can you set him up with me? 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  He is my next door neighbor, very nice gentleman.  Thanks   [employer]

## 2018-10-17 NOTE — Telephone Encounter (Signed)
Patient returning call.

## 2018-10-17 NOTE — Telephone Encounter (Signed)
Left message to call back  

## 2018-10-20 NOTE — Telephone Encounter (Signed)
Patient agrees to be managed by Nicki Reaper and Dr. Burt Knack. Per Dr. Darnelle Bos last OV note, patient is due for an appointment in June 2020. Confirmed with patient he is having no symptoms. Scheduled patient with Richardson Dopp on June 26, 2019. He understands to call prior to that time if symptoms occur.  He was grateful for call and agrees with treatment plan.

## 2018-11-04 ENCOUNTER — Other Ambulatory Visit: Payer: Self-pay | Admitting: Physician Assistant

## 2018-12-16 ENCOUNTER — Other Ambulatory Visit: Payer: Self-pay | Admitting: Urology

## 2019-01-01 ENCOUNTER — Encounter (HOSPITAL_COMMUNITY): Payer: Self-pay

## 2019-01-01 NOTE — Patient Instructions (Signed)
Frederick Miles  01/01/2019   Your procedure is scheduled on: 01-06-19  Report to [facility] Main  Entrance  Report to admitting at     [place]  AM    Call this number if you have problems the morning of surgery [phone]    Remember: Do not eat food or drink liquids :After Midnight . BRUSH YOUR TEETH MORNING OF SURGERY AND RINSE YOUR MOUTH OUT, NO CHEWING GUM CANDY OR MINTS.     Take these medicines the morning of surgery with A SIP OF WATER: OMEPRAZOLE, LEVOTHYROXINE, DILITIAZEM, LIPITOR DO NOT TAKE ANY  DIABETIC MEDICATIONS DAY OF YOUR SURGERY                               You may not have any metal on your body including hair pins and              piercings  Do not wear jewelry,lotions, powders or perfumes, deodorant                   Men may shave face and neck.   Do not bring valuables to the hospital. [place].  Contacts, dentures or bridgework may not be worn into surgery.      Patients discharged the day of surgery will not be allowed to drive home. IF YOU ARE HAVING SURGERY AND GOING HOME THE SAME DAY, YOU MUST HAVE AN ADULT TO DRIVE YOU HOME AND BE WITH YOU FOR 24 HOURS. YOU MAY GO HOME BY TAXI OR UBER OR ORTHERWISE, BUT AN ADULT MUST ACCOMPANY YOU HOME AND STAY WITH YOU FOR 24 HOURS.Name and phone number of your driver:  Special Instructions: N/A              Please read over the following fact sheets you were given: _____________________________________________________________________             [facility] - Preparing for Surgery Before surgery, you can play an important role.  Because skin is not sterile, your skin needs to be as free of germs as possible.  You can reduce the number of germs on your skin by washing with CHG (chlorahexidine gluconate) soap before surgery.  CHG is an antiseptic cleaner which kills germs and bonds with the skin to continue killing germs even after  washing. Please DO NOT use if you have an allergy to CHG or antibacterial soaps.  If your skin becomes reddened/irritated stop using the CHG and inform your nurse when you arrive at Short Stay. Do not shave (including legs and underarms) for at least 48 hours prior to the first CHG shower.  You may shave your face/neck. Please follow these instructions carefully:  1.  Shower with CHG Soap the night before surgery and the  morning of Surgery.  2.  If you choose to wash your hair, wash your hair first as usual with your  normal  shampoo.  3.  After you shampoo, rinse your hair and body thoroughly to remove the  shampoo.                           4.  Use CHG as you would any other liquid soap.  You can apply chg directly  to the skin and wash                       Gently with a scrungie or clean washcloth.  5.  Apply the CHG Soap to your body ONLY FROM THE NECK DOWN.   Do not use on face/ open                           Wound or open sores. Avoid contact with eyes, ears mouth and genitals (private parts).                       Wash face,  Genitals (private parts) with your normal soap.             6.  Wash thoroughly, paying special attention to the area where your surgery  will be performed.  7.  Thoroughly rinse your body with warm water from the neck down.  8.  DO NOT shower/wash with your normal soap after using and rinsing off  the CHG Soap.                9.  Pat yourself dry with a clean towel.            10.  Wear clean pajamas.            11.  Place clean sheets on your bed the night of your first shower and do not  sleep with pets. Day of Surgery : Do not apply any lotions/deodorants the morning of surgery.  Please wear clean clothes to the hospital/surgery center.  FAILURE TO FOLLOW THESE INSTRUCTIONS MAY RESULT IN THE CANCELLATION OF YOUR SURGERY PATIENT SIGNATURE_________________________________  NURSE  SIGNATURE__________________________________  ________________________________________________________________________ How to Manage Your Diabetes Before and After Surgery  Why is it important to control my blood sugar before and after surgery? . Improving blood sugar levels before and after surgery helps healing and can limit problems. . A way of improving blood sugar control is eating a healthy diet by: o  Eating less sugar and carbohydrates o  Increasing activity/exercise o  Talking with your doctor about reaching your blood sugar goals . High blood sugars (greater than 180 mg/dL) can raise your risk of infections and slow your recovery, so you will need to focus on controlling your diabetes during the weeks before surgery. . Make sure that the doctor who takes care of your diabetes knows about your planned surgery including the date and location.  How do I manage my blood sugar before surgery? . Check your blood sugar at least 4 times a day, starting 2 days before surgery, to make sure that the level is not too high or low. o Check your blood sugar the morning of your surgery when you wake up and every 2 hours until you get to the Short Stay unit. . If your blood sugar is less than 70 mg/dL, you will need to treat for low blood sugar: o Do not take insulin. o Treat a low blood sugar (less than 70 mg/dL) with  cup of clear juice (cranberry or apple), 4 glucose tablets, OR glucose gel. o Recheck blood sugar in 15 minutes after treatment (to make sure it is greater than 70 mg/dL). If your blood sugar is not greater than 70 mg/dL on recheck, call [phone] for further instructions. . Report your blood sugar to the short stay  nurse when you get to Short Stay.  . If you are admitted to the hospital after surgery: o Your blood sugar will be checked by the staff and you will probably be given insulin after surgery (instead of oral diabetes medicines) to make sure you have good blood sugar  levels. o The goal for blood sugar control after surgery is 80-180 mg/dL.   WHAT DO I DO ABOUT MY DIABETES MEDICATION?  Marland Kitchen Do not take oral diabetes medicines (pills) the morning of surgery.       THE  DAY  BEFORE SURGERY,   : JARDIANCE( NONE) , METFORMIN ( AS USUAL) ,  HUMALOG (AS USUAL WITH MEALS), INSULIN GLARGINE ( LANTUS)  TAKE 50% OF  NORMAL INSULIN DOSE WITH SUPPER (7 UNITS)   . THE MORNING OF SURGERY,: JARDIANCE( NONE),METFORMIN (NONE), HUMALOG ONLY IF BS >220 SEE BELOW, LANTUS NONE .   Marland Kitchen The day of surgery, do not take other diabetes injectables, including Byetta (exenatide), Bydureon (exenatide ER), Victoza (liraglutide), or Trulicity (dulaglutide).  . If your CBG is greater than 220 mg/dL, you may take  of your sliding scale  . (correction) dose of insulin.    Patient Signature:  Date:   Nurse Signature:  Date:   Reviewed and Endorsed by [facility] Patient Education Committee, August 2015

## 2019-01-01 NOTE — Progress Notes (Signed)
LEXISCAN 2016 - Epic EKG 06-27-18 -Epic LOV CARDS DR. END 06-27-18 -Epic LOV VASCULAR 08-15-18 Epic CTA ABD/PELVIS 08-05-18 Epic

## 2019-01-02 ENCOUNTER — Other Ambulatory Visit: Payer: Self-pay

## 2019-01-02 ENCOUNTER — Encounter (HOSPITAL_COMMUNITY): Payer: Self-pay

## 2019-01-02 ENCOUNTER — Encounter (INDEPENDENT_AMBULATORY_CARE_PROVIDER_SITE_OTHER): Payer: Self-pay

## 2019-01-02 ENCOUNTER — Encounter (HOSPITAL_COMMUNITY)
Admission: RE | Admit: 2019-01-02 | Discharge: 2019-01-02 | Disposition: A | Discharge status: Home / Self Care | Payer: Medicare Other | Source: Ambulatory Visit | Attending: Urology | Admitting: Urology

## 2019-01-02 DIAGNOSIS — N209 Urinary calculus, unspecified: Secondary | ICD-10-CM | POA: Insufficient documentation

## 2019-01-02 DIAGNOSIS — Z01812 Encounter for preprocedural laboratory examination: Secondary | ICD-10-CM | POA: Insufficient documentation

## 2019-01-02 HISTORY — DX: Personal history of urinary calculi: Z87.442

## 2019-01-02 HISTORY — DX: Type 2 diabetes mellitus without complications: E11.9

## 2019-01-02 LAB — BASIC METABOLIC PANEL
Anion gap: 11 (ref 5–15)
BUN: 29 mg/dL — ABNORMAL HIGH (ref 8–23)
CO2: 23 mmol/L (ref 22–32)
Calcium: 9.3 mg/dL (ref 8.9–10.3)
Chloride: 104 mmol/L (ref 98–111)
Creatinine, Ser: 1.24 mg/dL (ref 0.61–1.24)
GFR calc Af Amer: >60 mL/min (ref >60)
GFR calc non Af Amer: 58 mL/min — ABNORMAL LOW (ref >60)
Glucose, Bld: 85 mg/dL (ref 70–99)
Potassium: 4.3 mmol/L (ref 3.5–5.1)
Sodium: 138 mmol/L (ref 135–145)

## 2019-01-02 LAB — CBC
HCT: 49 % (ref 39.0–52.0)
Hemoglobin: 16.6 g/dL (ref 13.0–17.0)
MCH: 32.4 pg (ref 26.0–34.0)
MCHC: 33.9 g/dL (ref 30.0–36.0)
MCV: 95.7 fL (ref 80.0–100.0)
Platelets: 182 10*3/uL (ref 150–400)
RBC: 5.12 MIL/uL (ref 4.22–5.81)
RDW: 12.5 % (ref 11.5–15.5)
WBC: 6.2 10*3/uL (ref 4.0–10.5)
nRBC: 0 % (ref 0.0–0.2)

## 2019-01-02 LAB — HEMOGLOBIN A1C
Hgb A1c MFr Bld: 6.4 % — ABNORMAL HIGH (ref 4.8–5.6)
Mean Plasma Glucose: 136.98 mg/dL

## 2019-01-02 NOTE — Progress Notes (Signed)
PCP:  Dr. Virgina Jock  CARDIOLOGIST: Dr. Burt Knack new to him sees in June 2020, Also Richardson Dopp ,[place] has seen him before  INFO IN Epic: BUN 29  INFO ON CHART: epic- ekg , LOV cards Dr. Jaclyn Prime LOV vascular   BLOOD THINNERS AND LAST DOSES: xarelto 01-01-09 last xarelto dose instructed by Dr. Denton Lank office  ____________________________________  PATIENT SYMPTOMS AT TIME OF PREOP: NONE has extensive history

## 2019-01-05 NOTE — H&P (Signed)
CC: I have kidney stones.  HPI: Frederick Miles is a 73 year-old male established patient who is here for renal calculi.    Tay returns today in f/u his history of stones. He had a ureteroscopy for a ureteral stone in 9/13. CT in 8/19 shows a stable 16mm right renal stone but also a new 4-36mm left UJV stone without obstruction. He has had no flank pain or gross hematuria. He has 3-10 RBC's today.   He also has a history of bladder cancer with a LG NMIBC in 4/11 and had a negative cystoscopy and CT last year. Further cystos were not felt to be indicated.     CC: AUA Questions Scoring.  HPI:     AUA Symptom Score: Less than 50% of the time he has the sensation of not emptying his bladder completely when finished urinating. Less than 50% of the time he has to urinate again fewer than two hours after he has finished urinating. Less than 20% of the time he has to start and stop again several times when he urinates. 50% of the time he finds it difficult to postpone urination. 50% of the time he has a weak urinary stream. He never has to push or strain to begin urination. He has to get up to urinate 2 times from the time he goes to bed until the time he gets up in the morning.   Calculated AUA Symptom Score: 13    ALLERGIES: No Allergies    MEDICATIONS: Metformin Hcl  Atorvastatin Calcium 40 mg tablet Oral  Diltiazem 12Hr Er  Humalog  Hydrochlorothiazide 12.5 mg tablet Oral  Insulin Pen Needle  Jardiance 10 mg tablet  Lisinopril 40 mg tablet Oral  Magnesium  Nitrostat 0.4 mg tablet, sublingual Sublingual  Plavix 75 mg tablet Oral  Prilosec Otc 20 mg tablet, delayed release Oral  Synthroid 125 mcg tablet Oral  Xarelto     GU PSH: Cysto Uretero Lithotripsy - 2013 Cystoscopy - 2017 Cystoscopy Insert Stent - 2013, 2013 Cystoscopy TURBT <2 cm - 2011 Locm 300-399Mg /Ml Iodine,1Ml - 2017      PSH Notes: Cystoscopy With Insertion Of Ureteral Stent Left, Cystoscopy With Ureteroscopy  With Lithotripsy, Cystoscopy With Insertion Of Ureteral Stent Left, Cystoscopy With Fulguration Small Lesion (5-25mm), Cath Stent Placement, Heart Surgery   NON-GU PSH: None   GU PMH: Renal calculus, He has a stable RLP stone but no symptoms. F/U in 1 year with a UA. - 10/28/2017, Kidney stone on right side, - 2016 Microscopic hematuria (Worsening), 3-10 RBC's today - 2017 BPH w/LUTS, Benign prostatic hyperplasia with urinary obstruction - 2016 History of bladder cancer, History of malignant neoplasm of bladder - 2016 Other microscopic hematuria, Microscopic Hematuria - 2014, Microscopic Hematuria, - 2014 Ureteral calculus, Mid Ureteral Stone On The Left - 2014      PMH Notes:  2007-02-03 10:06:04 - Note: Acute Myocardial Infarction  2009-01-21 11:24:20 - Note: Aneurysm Of The Abdominal Aorta   NON-GU PMH: Encounter for general adult medical examination without abnormal findings, Encounter for preventive health examination - 2015 Personal history of other diseases of the digestive system, History of esophageal reflux - 2014 Diabetes Type 2    FAMILY HISTORY: Diabetes - Grandmother, Brother, Mother Family Health Status Number - Runs In Family Prostate Cancer - Runs In Family   SOCIAL HISTORY: Marital Status: Married Preferred Language: English; Ethnicity: Not Hispanic Or Latino; Race: White    REVIEW OF SYSTEMS:    GU Review Male:  Patient denies frequent urination, hard to postpone urination, burning/ pain with urination, get up at night to urinate, leakage of urine, stream starts and stops, trouble starting your stream, have to strain to urinate , erection problems, and penile pain.  Gastrointestinal (Upper):   Patient denies nausea, vomiting, and indigestion/ heartburn.  Gastrointestinal (Lower):   Patient denies diarrhea and constipation.  Constitutional:   Patient denies fever, night sweats, weight loss, and fatigue.  Skin:   Patient denies skin rash/ lesion and itching.   Eyes:   Patient denies blurred vision and double vision.  Ears/ Nose/ Throat:   Patient denies sore throat and sinus problems.  Hematologic/Lymphatic:   Patient denies swollen glands and easy bruising.  Cardiovascular:   Patient denies leg swelling and chest pains.  Respiratory:   Patient denies cough and shortness of breath.  Endocrine:   Patient denies excessive thirst.  Musculoskeletal:   Patient denies back pain and joint pain.  Neurological:   Patient denies headaches and dizziness.  Psychologic:   Patient denies depression and anxiety.   VITAL SIGNS:      12/16/2018 08:24 AM  Weight 173 lb / 78.47 kg  Height 70 in / 177.8 cm  BP 126/79 mmHg  Pulse 61 /min  Temperature 98.0 F / 36.6 C  BMI 24.8 kg/m   MULTI-SYSTEM PHYSICAL EXAMINATION:    Constitutional: Well-nourished. No physical deformities. Normally developed. Good grooming.   Respiratory: Normal breath sounds. No labored breathing, no use of accessory muscles.   Cardiovascular: Regular rate and rhythm. No murmur, no gallop.      PAST DATA REVIEWED:  Source Of History:  Patient  Urine Test Review:   Urinalysis  X-Ray Review: KUB: Reviewed Films. Discussed With Patient.  C.T. Chest/ Abd/Pelvis: Reviewed Films. Reviewed Report. Discussed With Patient. He had a scan in 8/19 and had a non-obstructing left UVJ stone. He has the right renal stone as well. His aneurysm is stable.     07/15/09  PSA  Total PSA 0.44     PROCEDURES:         KUB - 74018  A single view of the abdomen is obtained. There is a 40mm stone in the area of the [place]. There is an 9mm right renal stone. No other significant bone, gas or soft tissue abnormalities are noted.                Urinalysis w/Scope - 81001 Dipstick Dipstick Cont'd Micro  Color: Yellow Bilirubin: Neg mg/dL WBC/hpf: 0 - 5/hpf  Appearance: Clear Ketones: Neg mg/dL RBC/hpf: 3 - 10/hpf  Specific Gravity: 1.025 Blood: 2+ ery/uL Bacteria: NS (Not Seen)  pH: <=5.0 Protein: 1+  mg/dL Cystals: NS (Not Seen)  Glucose: 3+ mg/dL Urobilinogen: 0.2 mg/dL Casts: NS (Not Seen)    Nitrites: Neg Trichomonas: Not Present    Leukocyte Esterase: Neg leu/uL Mucous: Present      Epithelial Cells: NS (Not Seen)      Yeast: NS (Not Seen)      Sperm: Present    ASSESSMENT:      ICD-10 Details  1 GU:   Renal calculus - N20.0 Right, Stable  2   Ureteral calculus - N20.1 Left, He has a left UVJ stone that has been present since 8/19. I discussed options and since he needs cystoscopy with the microhematuria, I am going to get him set up for left ureteroscopy. I have reviewed the risks of ureteroscopy including bleeding, infection, ureteral injury, need for a stent or  secondary procedures, thrombotic events and anesthetic complications.   3   Microscopic hematuria - R31.21 Worsening  4   History of bladder cancer - Z85.51    PLAN:           Orders X-Rays: KUB          Schedule Return Visit/Planned Activity: Next Available Appointment - Schedule Surgery             Note: left ureteroscopy          Document Letter(s):  Created for Patient: Clinical Summary

## 2019-01-05 NOTE — Progress Notes (Signed)
Anesthesia Chart Review   Case:  [id number] Date/Time:  01/06/19 1130   Procedure:  LEFT URETEROSCOPY WITH POSSIBLE HOLMIUM LASER LITHOTRIPSY AND STENT (Left )   Anesthesia type:  General   Pre-op diagnosis:  LEFT DISTAL STONE   Location:  [facility]   Surgeon:  Irine Seal, MD      DISCUSSION: 73 yo former smoker (quit 08/05/79) with history of CAD is post NSTEMI (12/07) with DES to the LAD, paroxysmal A-fib, PAD/AAA, HTN, HLD, esophageal stricture, bladder cancer, and remote rheumatic fever scheduled for above surgery on 01/06/2019 with Dr. Roni Bread.  AAA followed by Dr. Donzetta Matters.  He was last seen on 08/15/18.  At this visit pt stable. Dr. Donzetta Matters recommended a duplex study in 2 years.    He was last seen by cardiology on 06/27/18.  Seen by Dr. Saunders Revel.  Per his note pt doing well, walking 5 days a week without difficulty and denies chest pain, shortness of breath, palpitations, lightheadedness, orthopnea, or edema.  He is on anticoagulation for A-fib, Xarelto, as well as Diltiazem for rate control.  Recommended to follow up with cardiology in 1 year.  During PST visit pt reports his last dose of Xarelto was 01/01/19, pt reports advised by Dr. Jeffie Pollock.   DM II well controlled and followed by Dr. Virgina Jock.  Last A1C 6.4.    Pt can proceed with planned procedure barring acute status change.  VS: BP 116/67   Pulse (!) 55   Temp 36.7 C (Oral)   Ht 5\' 10"  (1.778 m)   Wt 76.2 kg   SpO2 100%   BMI 24.11 kg/m   PROVIDERS: Shon Baton, MD is PCP  Waynetta Sandy, MD is vascular surgeon  End, Harrell Gave, MD is Cardiologist   LABS: Labs reviewed: Acceptable for surgery. 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 Alternatively, this could represent a chronically thrombosed false lumen from an aortic dissection. This eccentric thrombosed aneurysm/dissection has been present for many years and no change since 2017.  NON-VASCULAR  **An incidental finding of potential clinical significance has been found. Nonobstructive 4 mm stone at the left ureterovesical junction. There is no significant hydronephrosis or inflammation associated with this stone. Bilateral nephrolithiasis.**  Renal cysts.   EKG: 06/27/18 Rate 57 bpm Sinus bradycardia Otherwise normal ECG  CV: Echo 12/07/14 Study Conclusions  - Left ventricle: The cavity size was normal. There was moderate focal basal and mild concentric hypertrophy of the left ventricle. Systolic function was vigorous. The estimated ejection fraction was in the range of 65% to 70%. Wall motion was normal; there were no regional wall motion abnormalities. Doppler parameters are consistent with abnormal left ventricular relaxation (grade 1 diastolic dysfunction). There was no evidence of elevated ventricular filling pressure by Doppler parameters. - Aortic valve: Trileaflet; normal thickness leaflets. There was mild regurgitation. - Aortic root: The  aortic root was normal in size. - Ascending aorta: The ascending aorta was mildly dilated measuring 44 mm. - Mitral valve: There was no regurgitation. - Left atrium: The atrium was normal in size. - Right ventricle: Systolic function was normal. - Right atrium: The atrium was normal in size. - Atrial septum: No defect or patent foramen ovale was identified. - Tricuspid valve: There was trivial  regurgitation. - Pulmonary arteries: Systolic pressure was within the normal range. - Inferior vena cava: The vessel was normal in size. - Pericardium, extracardiac: There was no pericardial effusion.  Stress Test 07/19/15   The left ventricular ejection fraction is hyperdynamic (>65%).  Nuclear stress EF: 66%.  There was no ST segment deviation noted during stress.  This is a low risk study.   Low risk stress nuclear study with a small, medium intensity, fixed inferior defect consistent with diaphragmatic attenuation; no ischemia; EF 66 and normal wall motion.  Past Medical History:  Diagnosis Date  . AAA (abdominal aortic aneurysm) without rupture ([place]) 04/28/2017   Aorta US 1/18: 4.2 cm AAA  . Bladder cancer ([place]) 2006  . Cervicalgia   . Coronary atherosclerosis of native coronary artery    stent x1  . Diabetes mellitus without complication ([employer])    typ2  . Esophageal reflux   . Esophageal stricture   . Heart murmur    slight  . History of cardiovascular stress test    Lexiscan Myoview 7/16: EF 66%, diaphragmatic attenuation, no ischemia, low risk  . History of kidney stones   . Hx of adenomatous colonic polyps   . Hyperlipidemia   . Hypertension   . Myocardial infarct ([place])    2007  . Unspecified hypothyroidism     Past Surgical History:  Procedure Laterality Date  . CORONARY ANGIOPLASTY WITH STENT PLACEMENT  2007  . CYSTOSCOPY/RETROGRADE/URETEROSCOPY/STONE EXTRACTION WITH BASKET  08/08/2012   Procedure: CYSTOSCOPY/RETROGRADE/URETEROSCOPY/STONE EXTRACTION WITH BASKET;  Surgeon: Malka So, MD;  Location: [facility];  Service: Urology;  Laterality: Left;  Left Ureteroscopy with Stone Extraction  . SKIN TAG REMOVAL  07/09/06   right anterior thigh  . TONSILLECTOMY AND ADENOIDECTOMY    . TRANSURETHRAL RESECTION OF BLADDER  07/09/06   tumor 2-5 cm    MEDICATIONS: . atorvastatin (LIPITOR) 40 MG tablet  . diltiazem (CARDIZEM CD) 120 MG 24 hr capsule  .  empagliflozin (JARDIANCE) 10 MG TABS tablet  . hydrochlorothiazide (HYDRODIURIL) 25 MG tablet  . Insulin Glargine (BASAGLAR KWIKPEN [place])  . insulin lispro (HUMALOG) 100 UNIT/ML injection  . levothyroxine (SYNTHROID, LEVOTHROID) 125 MCG tablet  . lisinopril (PRINIVIL,ZESTRIL) 20 MG tablet  . Magnesium 500 MG CAPS  . Magnesium Oxide 400 (240 Mg) MG TABS  . metFORMIN (GLUCOPHAGE) 500 MG tablet  . nitroGLYCERIN (NITROSTAT) 0.4 MG SL tablet  . omeprazole (PRILOSEC) 20 MG capsule  . XARELTO 20 MG TABS tablet   No current facility-administered medications for this encounter.      Frederick Felix, PA-C [facility] [phone] 01/05/19 10:30 AM

## 2019-01-05 NOTE — Anesthesia Preprocedure Evaluation (Addendum)
Anesthesia Evaluation  Patient identified by MRN, date of birth, ID band Patient awake    Reviewed: Allergy & Precautions, NPO status , Patient's Chart, lab work & pertinent test results  Airway Mallampati: I       Dental no notable dental hx. (+) Teeth Intact   Pulmonary former smoker,    Pulmonary exam normal breath sounds clear to auscultation       Cardiovascular hypertension, Pt. on medications + CAD and + Cardiac Stents  Normal cardiovascular exam Rhythm:Regular Rate:Normal     Neuro/Psych    GI/Hepatic GERD  Medicated and Controlled,  Endo/Other  diabetes, Type 2, Insulin Dependent, Oral Hypoglycemic AgentsHypothyroidism   Renal/GU      Musculoskeletal   Abdominal Normal abdominal exam  (+)   Peds  Hematology negative hematology ROS (+)   Anesthesia Other Findings  [id number] 73 y.o. male Result Notes for Myocardial Perfusion Imaging   Notes Recorded by Katrine Coho, RN on 07/21/2015 at 8:55 AM Pt advised, verbalized understanding. ------  Notes Recorded by Tamsen Snider on 07/20/2015 at 12:00 PM Left message on machine for pt to contact the office.  ------  Notes Recorded by Liliane Shi, PA-C on 07/19/2015 at 4:59 PM Please tell patient that his stress test is normal Continue with current treatment plan. Richardson Dopp, PA-C  07/19/2015 4:59 PM   Vitals   Height Weight BMI (Calculated) 5\' 10"  (1.778 m) 89.8 kg 28.5 Study Highlights    The left ventricular ejection fraction is hyperdynamic (>65%).  Nuclear stress EF: 66%.  There was no ST segment deviation noted during stress.  This is a low risk study.   Low risk stress nuclear study with a small, medium intensity, fixed inferior defect consistent with diaphragmatic attenuation; no ischemia; EF 66 and normal wall motion.  Nuclear History and Indications   History and Indications Indication for Stress Test: Evaluation of  extent and severity of coronary artery disease History: CAD, Stent, 2008 Myocardial Perfusion Imaging-mild inferior thinning, no ischemia, EF=75%, and Patient seen in hospital on 06-25-2015 for chest tightness, AFIB/RVR, Troponin negative Cardiac Risk Factors: Hypertension  Symptoms: Chest Tightness with Exertion, DOE, Palpitations and SOB Stress Findings   ECG Baseline ECG is normal.. Stress Findings A pharmacological stress test was performed using IV Lexiscan 0.4mg  over 10 seconds performed with concurrent submaximal exercise (1.23mph, 0% grade).  The patient reported no symptoms during the stress test.  Recovery time: 6 minutes. Patients HR slow to increase. Patient fatigued at 11:00 minutes into exercise and HR at 113. Patient could not continue and asked to stop. Switched to low level Lexiscan. Response to Stress There was no ST segment deviation noted during stress.  Arrhythmias during stress: none.  Arrhythmias during recovery: none.  There were no significant arrhythmias noted during the test.  ECG was interpretable and there was no significant change from baseline. Stress Measurements   Baseline Vitals Rest HR  73 bpm  Rest BP  141/85 mmHg  Peak Stress Vitals Peak HR  107 bpm  Peak BP  118/72 mmHg    Nuclear Stress Measurements   LV sys vol  25 mL  TID  0.84   LV dias vol  72 mL  LHR  0.29   SSS  3   SRS  1   SDS  2       Nuclear Stress Findings   Isotope administration Rest isotope was administered with an IV injection of 10.2 mCi Tc70m Sestamibi. Rest SPECT images  were obtained approximately 45 minutes post tracer injection. Stress isotope was administered with an IV injection of 32.9 mCi Tc86m Sestamibi 20 seconds post IV Lexiscan administration. Stress SPECT images were obtained approximately 60 minutes post tracer injection. Nuclear Measurements Study was gated. Rest Perfusion There is a defect present in the basal inferior and mid inferior  location. Stress Perfusion There is a defect present in the basal inferior and mid inferior location. Wall Scoring   Score Index: 1.000 Percent Normal: 100.0%         The left ventricular wall motion is normal.     Resulted by:   Signed Date/Time  Phone Pager Lelon Perla 07/19/2015 2:46 PM [phone]  Report approved and finalized on 07/19/2015 1446 Imaging   Imaging Information Encounter-Level Documents - 07/19/2015:   Scan on 07/28/2015 10:48 AM by Default, Provider, MD  Electronic signature on 07/19/2015 8:56 AM - Signed  Electronic signature on 07/19/2015 8:56 AM - Signed    Order-Level Documents - 07/19/2015:   Scan on 07/20/2015 10:32 AM by Sallee Provencal L: NM Image and Information - [facility]   Scan on 07/19/2015 11:02 AM by Default, Provider, [place]:   There are no hospital account-level documents.  Exam Information   Status Exam Begun  Exam Ended  Final (99) 07/19/2015 9:22 AM 07/19/2015 11:57 AM External Result Report   External Result Report Encounter Report   Patient Encounter Report    Reproductive/Obstetrics                            Anesthesia Physical Anesthesia Plan  ASA: II  Anesthesia Plan: General   Post-op Pain Management:    Induction: Intravenous  PONV Risk Score and Plan: 3 and Ondansetron and Treatment may vary due to age or medical condition  Airway Management Planned: LMA  Additional Equipment:   Intra-op Plan:   Post-operative Plan: Extubation in OR  Informed Consent: I have reviewed the patients History and Physical, chart, labs and discussed the procedure including the risks, benefits and alternatives for the proposed anesthesia with the patient or authorized representative who has indicated his/her understanding and acceptance.   Dental advisory given  Plan Discussed with: CRNA  Anesthesia Plan Comments: (See PST note 01/02/2019, Konrad Felix, PA-C)       Anesthesia Quick Evaluation

## 2019-01-06 ENCOUNTER — Ambulatory Visit (HOSPITAL_COMMUNITY): Payer: Medicare Other | Admitting: Certified Registered Nurse Anesthetist

## 2019-01-06 ENCOUNTER — Ambulatory Visit (HOSPITAL_COMMUNITY): Payer: Medicare Other | Admitting: Physician Assistant

## 2019-01-06 ENCOUNTER — Encounter (HOSPITAL_COMMUNITY): Payer: Self-pay

## 2019-01-06 ENCOUNTER — Telehealth (HOSPITAL_COMMUNITY): Payer: Self-pay | Admitting: *Deleted

## 2019-01-06 ENCOUNTER — Ambulatory Visit (HOSPITAL_COMMUNITY): Payer: Medicare Other

## 2019-01-06 ENCOUNTER — Ambulatory Visit (HOSPITAL_COMMUNITY)
Admission: RE | Admit: 2019-01-06 | Discharge: 2019-01-06 | Disposition: A | Discharge status: Home / Self Care | Payer: Medicare Other | Source: Ambulatory Visit | Attending: Urology | Admitting: Urology

## 2019-01-06 ENCOUNTER — Encounter (HOSPITAL_COMMUNITY)
Admission: RE | Disposition: A | Discharge status: Home / Self Care | Payer: Self-pay | Source: Ambulatory Visit | Attending: Urology

## 2019-01-06 DIAGNOSIS — I714 Abdominal aortic aneurysm, without rupture: Secondary | ICD-10-CM | POA: Insufficient documentation

## 2019-01-06 DIAGNOSIS — Z833 Family history of diabetes mellitus: Secondary | ICD-10-CM | POA: Insufficient documentation

## 2019-01-06 DIAGNOSIS — N201 Calculus of ureter: Secondary | ICD-10-CM | POA: Insufficient documentation

## 2019-01-06 DIAGNOSIS — Z7901 Long term (current) use of anticoagulants: Secondary | ICD-10-CM | POA: Diagnosis not present

## 2019-01-06 DIAGNOSIS — N329 Bladder disorder, unspecified: Secondary | ICD-10-CM | POA: Insufficient documentation

## 2019-01-06 DIAGNOSIS — Z8551 Personal history of malignant neoplasm of bladder: Secondary | ICD-10-CM | POA: Insufficient documentation

## 2019-01-06 DIAGNOSIS — I252 Old myocardial infarction: Secondary | ICD-10-CM | POA: Diagnosis not present

## 2019-01-06 DIAGNOSIS — Z79899 Other long term (current) drug therapy: Secondary | ICD-10-CM | POA: Diagnosis not present

## 2019-01-06 DIAGNOSIS — Z87442 Personal history of urinary calculi: Secondary | ICD-10-CM | POA: Insufficient documentation

## 2019-01-06 DIAGNOSIS — Z794 Long term (current) use of insulin: Secondary | ICD-10-CM | POA: Insufficient documentation

## 2019-01-06 DIAGNOSIS — Z8042 Family history of malignant neoplasm of prostate: Secondary | ICD-10-CM | POA: Diagnosis not present

## 2019-01-06 HISTORY — PX: URETEROSCOPY WITH HOLMIUM LASER LITHOTRIPSY: SHX6645

## 2019-01-06 LAB — GLUCOSE, CAPILLARY: Glucose-Capillary: 150 mg/dL — ABNORMAL HIGH (ref 70–99)

## 2019-01-06 SURGERY — URETEROSCOPY, WITH LITHOTRIPSY USING HOLMIUM LASER
Anesthesia: General | Site: Ureter | Laterality: Left

## 2019-01-06 MED ORDER — CEFAZOLIN SODIUM-DEXTROSE 2-4 GM/100ML-% IV SOLN
INTRAVENOUS | Status: AC
  Filled 2019-01-06: qty 100

## 2019-01-06 MED ORDER — EPHEDRINE 5 MG/ML INJ
INTRAVENOUS | Status: AC
  Filled 2019-01-06: qty 10

## 2019-01-06 MED ORDER — LIDOCAINE 2% (20 MG/ML) 5 ML SYRINGE
INTRAMUSCULAR | Status: DC | PRN
  Administered 2019-01-06: 100 mg via INTRAVENOUS

## 2019-01-06 MED ORDER — PROPOFOL 10 MG/ML IV BOLUS
INTRAVENOUS | Status: AC
  Filled 2019-01-06: qty 40

## 2019-01-06 MED ORDER — FENTANYL CITRATE (PF) 100 MCG/2ML IJ SOLN
INTRAMUSCULAR | Status: DC | PRN
  Administered 2019-01-06: 50 ug via INTRAVENOUS

## 2019-01-06 MED ORDER — PROPOFOL 10 MG/ML IV BOLUS
INTRAVENOUS | Status: DC | PRN
  Administered 2019-01-06: 150 mg via INTRAVENOUS

## 2019-01-06 MED ORDER — FENTANYL CITRATE (PF) 250 MCG/5ML IJ SOLN
INTRAMUSCULAR | Status: AC
  Filled 2019-01-06: qty 5

## 2019-01-06 MED ORDER — MIDAZOLAM HCL 2 MG/2ML IJ SOLN
INTRAMUSCULAR | Status: DC | PRN
  Administered 2019-01-06: 2 mg via INTRAVENOUS

## 2019-01-06 MED ORDER — ONDANSETRON HCL 4 MG/2ML IJ SOLN
INTRAMUSCULAR | Status: DC | PRN
  Administered 2019-01-06: 4 mg via INTRAVENOUS

## 2019-01-06 MED ORDER — CEFAZOLIN SODIUM-DEXTROSE 2-4 GM/100ML-% IV SOLN
2.0000 g | INTRAVENOUS | Status: AC
  Administered 2019-01-06: 2 g via INTRAVENOUS

## 2019-01-06 MED ORDER — SODIUM CHLORIDE 0.9 % IR SOLN
Status: DC | PRN
  Administered 2019-01-06: 6000 mL

## 2019-01-06 MED ORDER — IOHEXOL 300 MG/ML  SOLN
INTRAMUSCULAR | Status: DC | PRN
  Administered 2019-01-06: 15 mL

## 2019-01-06 MED ORDER — HYDROCODONE-ACETAMINOPHEN 5-325 MG PO TABS: 1.0000 | ORAL_TABLET | Freq: Four times a day (QID) | ORAL | 0 refills | Status: DC | PRN

## 2019-01-06 MED ORDER — MIDAZOLAM HCL 2 MG/2ML IJ SOLN
INTRAMUSCULAR | Status: AC
  Filled 2019-01-06: qty 2

## 2019-01-06 MED ORDER — LACTATED RINGERS IV SOLN
INTRAVENOUS | Status: DC
  Administered 2019-01-06: 1000 mL via INTRAVENOUS

## 2019-01-06 MED ORDER — EPHEDRINE SULFATE-NACL 50-0.9 MG/10ML-% IV SOSY
PREFILLED_SYRINGE | INTRAVENOUS | Status: DC | PRN
  Administered 2019-01-06: 7.5 mg via INTRAVENOUS
  Administered 2019-01-06: 5 mg via INTRAVENOUS

## 2019-01-06 MED ORDER — SODIUM CHLORIDE 0.9 % IR SOLN
Status: DC | PRN
  Administered 2019-01-06: 1000 mL

## 2019-01-06 SURGICAL SUPPLY — 22 items
BAG URO CATCHER STRL LF (MISCELLANEOUS) ×2 IMPLANT
BASKET STONE NCOMPASS (UROLOGICAL SUPPLIES) IMPLANT
CATH URET 5FR 28IN OPEN ENDED (CATHETERS) IMPLANT
CATH URET DUAL LUMEN 6-10FR 50 (CATHETERS) ×1 IMPLANT
CLOTH BEACON ORANGE TIMEOUT ST (SAFETY) ×2 IMPLANT
COVER WAND RF STERILE (DRAPES) IMPLANT
EXTRACTOR STONE NITINOL NGAGE (UROLOGICAL SUPPLIES) ×1 IMPLANT
FIBER LASER FLEXIVA 1000 (UROLOGICAL SUPPLIES) IMPLANT
FIBER LASER FLEXIVA 365 (UROLOGICAL SUPPLIES) IMPLANT
FIBER LASER FLEXIVA 550 (UROLOGICAL SUPPLIES) IMPLANT
FIBER LASER TRAC TIP (UROLOGICAL SUPPLIES) IMPLANT
GLOVE SURG SS PI 8.0 STRL IVOR (GLOVE) IMPLANT
GOWN STRL REUS W/TWL XL LVL3 (GOWN DISPOSABLE) ×2 IMPLANT
GUIDEWIRE STR DUAL SENSOR (WIRE) ×2 IMPLANT
IV NS 1000ML (IV SOLUTION) ×2
IV NS 1000ML BAXH (IV SOLUTION) ×1 IMPLANT
IV NS IRRIG 3000ML ARTHROMATIC (IV SOLUTION) ×2 IMPLANT
MANIFOLD NEPTUNE II (INSTRUMENTS) ×2 IMPLANT
PACK CYSTO (CUSTOM PROCEDURE TRAY) ×2 IMPLANT
SHEATH URETERAL 12FRX35CM (MISCELLANEOUS) ×1 IMPLANT
TUBING CONNECTING 10 (TUBING) ×2 IMPLANT
TUBING UROLOGY SET (TUBING) ×2 IMPLANT

## 2019-01-06 NOTE — Anesthesia Procedure Notes (Signed)
Procedure Name: LMA Insertion Date/Time: 01/06/2019 10:02 AM Performed by: Claudia Desanctis, CRNA Pre-anesthesia Checklist: Emergency Drugs available, Patient identified, Suction available and Patient being monitored Patient Re-evaluated:Patient Re-evaluated prior to induction Oxygen Delivery Method: Circle system utilized Preoxygenation: Pre-oxygenation with 100% oxygen Induction Type: IV induction LMA: LMA inserted LMA Size: 4.0 Number of attempts: 1 Placement Confirmation: positive ETCO2 and breath sounds checked- equal and bilateral Tube secured with: Tape Dental Injury: Teeth and Oropharynx as per pre-operative assessment

## 2019-01-06 NOTE — Discharge Instructions (Signed)

## 2019-01-06 NOTE — Transfer of Care (Signed)
Immediate Anesthesia Transfer of Care Note  Patient: Frederick Miles  Procedure(s) Performed: LEFT URETEROSCOPY left retrograde pylegram basket extraction stone bladder biopsy fulgeration (Left Ureter)  Patient Location: PACU  Anesthesia Type:General  Level of Consciousness: awake, alert , oriented and patient cooperative  Airway & Oxygen Therapy: Patient Spontanous Breathing  Post-op Assessment: Report given to RN and Post -op Vital signs reviewed and stable  Post vital signs: Reviewed and stable  Last Vitals:  Vitals Value Taken Time  BP 142/80 01/06/2019 10:47 AM  Temp    Pulse 58 01/06/2019 10:48 AM  Resp 14 01/06/2019 10:48 AM  SpO2 100 % 01/06/2019 10:48 AM  Vitals shown include unvalidated device data.  Last Pain:  Vitals:   01/06/19 0936  TempSrc:   PainSc: 0-No pain      Patients Stated Pain Goal: 4 (38/38/18 4037)  Complications: No apparent anesthesia complications

## 2019-01-06 NOTE — Anesthesia Postprocedure Evaluation (Signed)
Anesthesia Post Note  Patient: Frederick Miles  Procedure(s) Performed: LEFT URETEROSCOPY left retrograde pylegram basket extraction stone bladder biopsy fulgeration (Left Ureter)     Patient location during evaluation: PACU Anesthesia Type: General Level of consciousness: awake Pain management: pain level controlled Vital Signs Assessment: post-procedure vital signs reviewed and stable Respiratory status: spontaneous breathing Cardiovascular status: stable Postop Assessment: no apparent nausea or vomiting Anesthetic complications: no    Last Vitals:  Vitals:   01/06/19 1050 01/06/19 1100  BP: (!) 142/80 135/82  Pulse: (!) 57 60  Resp: 15 17  Temp: (!) 36.4 C   SpO2: 99% 99%    Last Pain:  Vitals:   01/06/19 0936  TempSrc:   PainSc: 0-No pain   Pain Goal: Patients Stated Pain Goal: 4 (01/06/19 0936)               Huston Foley

## 2019-01-06 NOTE — Op Note (Signed)
Procedure: 1.  Cystoscopy with bilateral retrograde pyelograms and interpretation. 2.  Left ureteroscopic stone extraction. 3.  Bladder biopsy and fulguration.  Preop diagnosis: Microhematuria with history of bladder cancer and a left distal ureteral stone.  Postop diagnosis: 4 mm left distal ureteral stone and small right trigone bladder lesion, less than 5 mm.  Surgeon: Dr. Irine Seal.  Anesthesia: General.  Specimen: 1.  Left distal ureteral stone. 2.  Right trigone bladder biopsy.  Drain: None.  EBL: None.  Complications: None.  Indications: Frederick Miles is a 73 year old male with a history of bladder cancer and urolithiasis who was seen in the office recently with microhematuria.  He was noted to have a 4 mm left distal ureteral stone that had actually been present on a CT scan in August and was still present in December.  It was felt that cystoscopy and ureteroscopy were indicated for further evaluation of the history of bladder cancer and stone.  Procedure: He was given 2 g of Ancef.  A general anesthetic was induced.  He was placed in lithotomy position and fitted with PAS hose.  His perineum and genitalia were prepped with Betadine solution he was draped in usual sterile fashion.  Cystoscopy was performed and a 40 [place] scope and 30 degree lens.  Examination revealed a normal urethra.  The external sphincter was intact.  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  Examination of bladder revealed mild trabeculation.  The mucosa was generally unremarkable but there was a very small lesion just lateral to the right ureteral orifice that I felt would benefit from biopsy.  The ureteral orifice ease were unremarkable.  Bilateral retrograde pyelography was performed with a 5 [place] open-ended catheter and Omnipaque.  The left retrograde pyelogram demonstrated a filling defect in the distal ureter consistent with his known stone but was otherwise  normal.  The right retrograde pyelogram revealed a normal ureter and intrarenal collecting system.  A sensor guidewire was passed to the left collecting system under fluoroscopic guidance.  A 12 French introducer sheath inner core was then passed over the wire to dilate the distal ureter.  The 6.5 French semirigid ureteroscope was then passed alongside the wire and the ureteral stone was identified and grasped with an engage basket.  The stone was removed intact.  Inspection of the ureter after stone removal revealed no ureteral injury, additional stones or other need for a stent.  The cystoscope was then reinserted over the guidewire and the guidewire was removed.  The irrigation was changed from saline to water and a cup biopsy forceps was used to biopsy the small lesion lateral to the right ureteral orifice.  The lesion was approximately 3 to 5 mm in size.  The biopsy site was then fulgurated with a Bugbee electrode.  Once hemostasis was assured, the bladder was partially drained and the cystoscope was removed.  He was taken down from lithotomy position, his anesthetic was reversed and he was moved to recovery room in stable condition.  The biopsy was sent to the lab for pathology and the stone was given to the patient's wife.  There were no complications

## 2019-01-06 NOTE — Interval H&P Note (Signed)
History and Physical Interval Note:  01/06/2019 9:43 AM  Frederick Miles  has presented today for surgery, with the diagnosis of LEFT DISTAL STONE  The various methods of treatment have been discussed with the patient and family. After consideration of risks, benefits and other options for treatment, the patient has consented to  Procedure(s): LEFT URETEROSCOPY WITH POSSIBLE HOLMIUM LASER LITHOTRIPSY AND STENT (Left) as a surgical intervention .  The patient's history has been reviewed, patient examined, no change in status, stable for surgery.  I have reviewed the patient's chart and labs.  Questions were answered to the patient's satisfaction.     Irine Seal

## 2019-01-07 ENCOUNTER — Encounter (HOSPITAL_COMMUNITY): Payer: Self-pay | Admitting: Urology

## 2019-06-24 ENCOUNTER — Telehealth: Payer: Self-pay | Admitting: *Deleted

## 2019-06-24 NOTE — Telephone Encounter (Signed)
Spoke with patient and patient consent to [email or web address]      Confirm consent - "In the setting of the current Covid19 crisis, you are scheduled for a (phone or video) visit with your provider on (date) at (time).  Just as we do with many in-office visits, in order for you to participate in this visit, we must obtain consent.  If you'd like, I can send this to your mychart (if signed up) or email for you to review.  Otherwise, I can obtain your verbal consent now.  All virtual visits are billed to your insurance company just like a normal visit would be.  By agreeing to a virtual visit, we'd like you to understand that the technology does not allow for your provider to perform an examination, and thus may limit your provider's ability to fully assess your condition. If your provider identifies any concerns that need to be evaluated in person, we will make arrangements to do so.  Finally, though the technology is pretty good, we cannot assure that it will always work on either your or our end, and in the setting of a video visit, we may have to convert it to a phone-only visit.  In either situation, we cannot ensure that we have a secure connection.  Are you willing to proceed?" STAFF: Did the patient verbally acknowledge consent to telehealth visit? Document YES/NO here:  YES   TELEPHONE CALL NOTE  AKIM WATKINSON has been deemed a candidate for a follow-up tele-health visit to limit community exposure during the Covid-19 pandemic. I spoke with the patient via phone to ensure availability of phone/video source, confirm preferred email & phone number, and discuss instructions and expectations.  I reminded LONELL STAMOS to be prepared with any vital sign and/or heart rhythm information that could potentially be obtained via home monitoring, at the time of his visit. I reminded [name] to expect a phone call prior to his visit.  Claude Manges, [place] 06/24/2019 8:28 AM   IF USING DOXIMITY or  [email or web address] - The patient will receive a link just prior to their visit by text.  FULL LENGTH CONSENT FOR TELE-HEALTH VISIT   I hereby voluntarily request, consent and authorize [facility] and its employed or contracted physicians, physician assistants, nurse practitioners or other licensed health care professionals (the Practitioner), to provide me with telemedicine health care services (the "Services") as deemed necessary by the treating Practitioner. I acknowledge and consent to receive the Services by the Practitioner via telemedicine. I understand that the telemedicine visit will involve communicating with the Practitioner through live audiovisual communication technology and the disclosure of certain medical information by electronic transmission. I acknowledge that I have been given the opportunity to request an in-person assessment or other available alternative prior to the telemedicine visit and am voluntarily participating in the telemedicine visit.  I understand that I have the right to withhold or withdraw my consent to the use of telemedicine in the course of my care at any time, without affecting my right to future care or treatment, and that the Practitioner or I may terminate the telemedicine visit at any time. I understand that I have the right to inspect all information obtained and/or recorded in the course of the telemedicine visit and may receive copies of available information for a reasonable fee.  I understand that some of the potential risks of receiving the Services via telemedicine include:  Marland Kitchen Delay or interruption in medical evaluation due to technological equipment failure  or disruption; . Information transmitted may not be sufficient (e.g. poor resolution of images) to allow for appropriate medical decision making by the Practitioner; and/or  . In rare instances, security protocols could fail, causing a breach of personal health information.  Furthermore, I acknowledge that  it is my responsibility to provide information about my medical history, conditions and care that is complete and accurate to the best of my ability. I acknowledge that Practitioner's advice, recommendations, and/or decision may be based on factors not within their control, such as incomplete or inaccurate data provided by me or distortions of diagnostic images or specimens that may result from electronic transmissions. I understand that the practice of medicine is not an exact science and that Practitioner makes no warranties or guarantees regarding treatment outcomes. I acknowledge that I will receive a copy of this consent concurrently upon execution via email to the email address I last provided but may also request a printed copy by calling the office of [facility].    I understand that my insurance will be billed for this visit.   I have read or had this consent read to me. . I understand the contents of this consent, which adequately explains the benefits and risks of the Services being provided via telemedicine.  . I have been provided ample opportunity to ask questions regarding this consent and the Services and have had my questions answered to my satisfaction. . I give my informed consent for the services to be provided through the use of telemedicine in my medical care  By participating in this telemedicine visit I agree to the above.

## 2019-06-25 ENCOUNTER — Other Ambulatory Visit: Payer: Self-pay

## 2019-06-25 MED ORDER — LISINOPRIL 20 MG PO TABS: 20.0000 mg | ORAL_TABLET | Freq: Every day | ORAL | 0 refills | Status: DC

## 2019-06-25 NOTE — Progress Notes (Signed)
Virtual Visit via Telephone Note   This visit type was conducted due to national recommendations for restrictions regarding the COVID-19 Pandemic (e.g. social distancing) in an effort to limit this patient's exposure and mitigate transmission in our community.  Due to his co-morbid illnesses, this patient is at least at moderate risk for complications without adequate follow up.  This format is felt to be most appropriate for this patient at this time.  The patient did not have access to video technology/had technical difficulties with video requiring transitioning to audio format only (telephone).  All issues noted in this document were discussed and addressed.  No physical exam could be performed with this format.  Please refer to the patient's chart for his  consent to telehealth for [facility].   Date:  06/26/2019   ID:  Frederick Miles, DOB May 26, 1946, MRN [id number]  Patient Location: Home Provider Location: Home  PCP:  Shon Baton, MD  Cardiologist:  Sherren Mocha, MD / Richardson Dopp, PA-C  Electrophysiologist:  None   Evaluation Performed:  Follow-Up Visit  Chief Complaint:  Follow up on CAD  History of Present Illness:    Frederick Miles is a 73 y.o. 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 Saunders Revel in June 2019.  As Dr. Saunders Revel has transitioned to [facility], he is now followed by me and Dr. Burt Knack.  Today, he notes he is doing well.  Over the past several years he has lost almost 40 pounds.  He is exercising 3 times a week.  Overall, he is feeling well.  He has not had chest discomfort, shortness of breath, syncope  or near syncope, orthopnea or lower extremity swelling.  The patient does not have symptoms concerning for COVID-19 infection (fever, chills, cough, or new shortness of breath).    Past Medical History:  Diagnosis Date  . AAA (abdominal aortic aneurysm) without rupture ([place]) 04/28/2017   Aorta US 1/18: 4.2 cm AAA  . Bladder cancer ([place]) 2006  . Cervicalgia   . Coronary atherosclerosis of native coronary artery    stent x1  . Diabetes mellitus without complication ([place])    typ2  . Esophageal reflux   . Esophageal stricture   . Heart murmur    slight  . History of cardiovascular stress test    Lexiscan Myoview 7/16: EF 66%, diaphragmatic attenuation, no ischemia, low risk  . History of kidney stones   . Hx of adenomatous colonic polyps   . Hyperlipidemia   . Hypertension   . Myocardial infarct ([place])    2007  . Unspecified hypothyroidism    Past Surgical History:  Procedure Laterality Date  . CORONARY ANGIOPLASTY WITH STENT PLACEMENT  2007  . CYSTOSCOPY/RETROGRADE/URETEROSCOPY/STONE EXTRACTION WITH BASKET  08/08/2012   Procedure: CYSTOSCOPY/RETROGRADE/URETEROSCOPY/STONE EXTRACTION WITH BASKET;  Surgeon: Malka So, MD;  Location: [facility];  Service: Urology;  Laterality: Left;  Left Ureteroscopy with Stone Extraction  . SKIN TAG REMOVAL  07/09/06   right anterior thigh  . TONSILLECTOMY AND ADENOIDECTOMY    . TRANSURETHRAL RESECTION OF BLADDER  07/09/06  tumor 2-5 cm  . URETEROSCOPY WITH HOLMIUM LASER LITHOTRIPSY Left 01/06/2019   Procedure: LEFT URETEROSCOPY left retrograde pylegram basket extraction stone bladder biopsy fulgeration;  Surgeon: Irine Seal, MD;  Location: [facility];  Service: Urology;  Laterality: Left;     Current Meds  Medication Sig  . atorvastatin (LIPITOR) 40 MG tablet Take 40 mg by mouth daily.    Marland Kitchen diltiazem (CARDIZEM CD) 120 MG 24 hr capsule Take 1 capsule (120 mg total) by mouth daily.  . empagliflozin (JARDIANCE) 10 MG TABS tablet Take 10 mg by mouth  daily.   . hydrochlorothiazide (HYDRODIURIL) 25 MG tablet Take 1 tablet (25 mg total) by mouth daily.  . Insulin Glargine (BASAGLAR KWIKPEN [place]) Inject 14 Units into the skin daily with supper.   . insulin lispro (HUMALOG) 100 UNIT/ML injection Inject 3 Units into the skin 3 (three) times daily with meals.   Marland Kitchen levothyroxine (SYNTHROID, LEVOTHROID) 125 MCG tablet Take 125 mcg by mouth daily before breakfast.   . lisinopril (ZESTRIL) 20 MG tablet Take 1 tablet (20 mg total) by mouth daily. Pt must keep appt on June 26th for further refills  . Magnesium 500 MG CAPS Take 500 mg by mouth daily.  . metFORMIN (GLUCOPHAGE) 500 MG tablet Take 500 mg by mouth daily.  Marland Kitchen omeprazole (PRILOSEC) 20 MG capsule Take 20 mg by mouth daily.    Alveda Reasons 20 MG TABS tablet TAKE 1 TABLET BY MOUTH DAILY WITH SUPPER (Patient taking differently: Take 20 mg by mouth daily with supper. )  . [DISCONTINUED] Magnesium Oxide 400 (240 Mg) MG TABS Take 1 tablet (400 mg total) by mouth daily. Need to contact office for appointment for full refill amount     Allergies:   Patient has no known allergies.   Social History   Tobacco Use  . Smoking status: Former Smoker    Types: Cigarettes    Quit date: 08/05/1979    Years since quitting: 39.9  . Smokeless tobacco: Never Used  Substance Use Topics  . Alcohol use: Yes    Alcohol/week: 5.0 standard drinks    Types: 4 Glasses of wine, 1 Cans of beer per week  . Drug use: No     Family Hx: The patient's family history includes Heart attack in his father, paternal grandfather, and paternal uncle; Heart disease in his father and mother. There is no history of Colon cancer or Stroke.  ROS:   Please see the history of present illness.     All other systems reviewed and are negative.   Prior CV studies:   The following studies were reviewed today:  Abdominal/pelvic CTA 08/05/2018 IMPRESSION: VASCULAR  Stable thrombosed saccular aneurysm (3.1 cm) extending from the  infrarenal abdominal aorta into the right common iliac artery. Alternatively, this could represent a chronically thrombosed false lumen from an aortic dissection. This eccentric thrombosed aneurysm/dissection has been present for many years and no change since 2017.  NON-VASCULAR  **An incidental finding of potential clinical significance has been found. Nonobstructive 4 mm stone at the left ureterovesical junction. There is no significant hydronephrosis or inflammation associated with this stone. Bilateral nephrolithiasis.**  Renal cysts.  These results will be called to the ordering clinician or representative by the Radiologist Assistant, and communication documented in the PACS or zVision Dashboard.  AAA [place] 07/14/2018 IMPRESSION: 1. Technically limited exam with poor visualization of previously identified partially thrombosed distal aortic aneurysm. Further assessment with dedicated CTA with contrast is suggested if further evaluation is  warranted. 2. Mild ectasia of the proximal and mid aorta measuring up to 2.6 cm.  Aorta US 1/18 4.2 cm AAA  Myoview 7/16 Low risk stress nuclear study with a small, medium intensity, fixed inferior defect consistent with diaphragmatic attenuation; no ischemia; EF 66 and normal wall motion.  Echo 12/15 Mod focal basal septal hypertrophy, EF 65-70, no RWMA, Gr 1 DD, mild AI, dilated ascending aorta (44 mm), trivial TR  LHC 11/2006 1. LV:133/0/17.EF 65% without regional wall motion abnormality. 2. No aortic stenosis or mitral regurgitation. 3. Left main:Angiographically normal. 4. LAD:95% stenosis of the proximal vessel which was stented to no residual.There is a single moderate-sized diagonal.This vessel has a 50% stenosis in its midsection. 5. Circumflex:Large vessel giving rise to a single branching obtuse marginal.It has only minor luminal irregularities. 6. QIH:KVQQV, dominant  vessel.It is angiographically normal. PCI: 3 x 12 mm Taxus DES to pLAD  Labs/Other Tests and Data Reviewed:    EKG:  No ECG reviewed.  Recent Labs: 01/02/2019: BUN 29; Creatinine, Ser 1.24; Hemoglobin 16.6; Platelets 182; Potassium 4.3; Sodium 138   Recent Lipid Panel Lab Results  Component Value Date/Time   CHOL 123 03/03/2008 09:48 AM   TRIG 150 (H) 03/03/2008 09:48 AM   HDL 27.6 (L) 03/03/2008 09:48 AM   CHOLHDL 4.5 CALC 03/03/2008 09:48 AM   LDLCALC 65 03/03/2008 09:48 AM     Wt Readings from Last 3 Encounters:  06/26/19 165 lb (74.8 kg)  01/06/19 173 lb (78.5 kg)  01/02/19 168 lb (76.2 kg)     Objective:    Vital Signs:  BP 114/76   Pulse 90   Ht 5\' 10"  (1.778 m)   Wt 165 lb (74.8 kg)   BMI 23.68 kg/m    VITAL SIGNS:  reviewed GEN:  no acute distress RESPIRATORY:  No labored breathing NEURO:  Alert and oriented PSYCH:  Normal mood  ASSESSMENT & PLAN:    1. Coronary artery disease involving native coronary artery of native heart without angina pectoris History of non-ST elevation myocardial infarction in December 2007 treated with a Taxus stent to the LAD.  Nuclear stress test in 2016 was negative for ischemia.  He is doing well without anginal symptoms.  He is not on aspirin as he is on Rivaroxaban.  Continue statin therapy.  2. Paroxysmal atrial fibrillation (HCC) No assessment for rhythm today as this was a telemedicine visit.  He is tolerating anticoagulation.  Recent hemoglobin and creatinine stable.  3. AAA (abdominal aortic aneurysm) without rupture ([place]) He has seen Dr. Donzetta Matters with vascular surgery.  Plan is to follow-up with him in the next 2 to 3 years.  4. Essential hypertension The patient's blood pressure is controlled on his current regimen.  Continue current therapy.   5. Hyperlipidemia LDL goal <70 LDL optimal on most recent lab work.  Continue current Rx.    6. Type 2 diabetes mellitus with other circulatory complication, with long-term  current use of insulin (HCC) We discussed the benefits of therapy with empagliflozin.  7.  Dilated ascending aorta Plan follow-up 2D echo prior to next visit in 1 year.  41. Educated About Covid-19 Virus Infection   COVID-19 Education: The signs and symptoms of COVID-19 were discussed with the patient and how to seek care for testing (follow up with PCP or arrange E-visit).  The importance of social distancing was discussed today.  Time:   Today, I have spent 15 minutes with the patient with telehealth technology discussing the above  problems.     Medication Adjustments/Labs and Tests Ordered: Current medicines are reviewed at length with the patient today.  Concerns regarding medicines are outlined above.   Tests Ordered: Orders Placed This Encounter  Procedures  . ECHOCARDIOGRAM COMPLETE    Medication Changes: No orders of the defined types were placed in this encounter.   Follow Up:  In Person in 1 year(s)  Signed, Richardson Dopp, PA-C  06/26/2019 12:37 PM    [place]

## 2019-06-26 ENCOUNTER — Other Ambulatory Visit: Payer: Self-pay

## 2019-06-26 ENCOUNTER — Telehealth (INDEPENDENT_AMBULATORY_CARE_PROVIDER_SITE_OTHER): Payer: Medicare Other | Admitting: Physician Assistant

## 2019-06-26 ENCOUNTER — Encounter: Payer: Self-pay | Admitting: Physician Assistant

## 2019-06-26 VITALS — BP 114/76 | HR 90 | Ht 70.0 in | Wt 165.0 lb

## 2019-06-26 DIAGNOSIS — I10 Essential (primary) hypertension: Secondary | ICD-10-CM

## 2019-06-26 DIAGNOSIS — E785 Hyperlipidemia, unspecified: Secondary | ICD-10-CM

## 2019-06-26 DIAGNOSIS — E1159 Type 2 diabetes mellitus with other circulatory complications: Secondary | ICD-10-CM

## 2019-06-26 DIAGNOSIS — I714 Abdominal aortic aneurysm, without rupture, unspecified: Secondary | ICD-10-CM

## 2019-06-26 DIAGNOSIS — I480 Paroxysmal atrial fibrillation: Secondary | ICD-10-CM

## 2019-06-26 DIAGNOSIS — I2510 Atherosclerotic heart disease of native coronary artery without angina pectoris: Secondary | ICD-10-CM | POA: Diagnosis not present

## 2019-06-26 DIAGNOSIS — Z7189 Other specified counseling: Secondary | ICD-10-CM

## 2019-06-26 DIAGNOSIS — Z794 Long term (current) use of insulin: Secondary | ICD-10-CM

## 2019-06-26 DIAGNOSIS — I77810 Thoracic aortic ectasia: Secondary | ICD-10-CM

## 2019-06-26 NOTE — Patient Instructions (Signed)
Medication Instructions:  No changes.  If you need a refill on your cardiac medications before your next appointment, please call your pharmacy.   Lab work: None   If you have labs (blood work) drawn today and your tests are completely normal, you will receive your results only by: Marland Kitchen MyChart Message (if you have MyChart) OR . A paper copy in the mail If you have any lab test that is abnormal or we need to change your treatment, we will call you to review the results.  Testing/Procedures: Schedule an echocardiogram in 1 year (prior to your follow up appointment).  Your physician has requested that you have an echocardiogram. Echocardiography is a painless test that uses sound waves to create images of your heart. It provides your doctor with information about the size and shape of your heart and how well your heart's chambers and valves are working. This procedure takes approximately one hour. There are no restrictions for this procedure.   Follow-Up: At [facility], you and your health needs are our priority.  As part of our continuing mission to provide you with exceptional heart care, we have created designated Provider Care Teams.  These Care Teams include your primary Cardiologist (physician) and Advanced Practice Providers (APPs -  Physician Assistants and Nurse Practitioners) who all work together to provide you with the care you need, when you need it. You will need a follow up appointment in:  1 year.  Please call our office 2 months in advance to schedule this appointment.  You may see Sherren Mocha, MD or Richardson Dopp, PA-C   Any Other Special Instructions Will Be Listed Below (If Applicable).

## 2019-07-03 ENCOUNTER — Other Ambulatory Visit: Payer: Self-pay | Admitting: Physician Assistant

## 2019-07-24 ENCOUNTER — Other Ambulatory Visit: Payer: Self-pay | Admitting: Physician Assistant

## 2019-09-20 ENCOUNTER — Other Ambulatory Visit: Payer: Self-pay | Admitting: Internal Medicine

## 2019-09-21 NOTE — Telephone Encounter (Signed)
Please review for refill. Thank you! 

## 2019-10-24 ENCOUNTER — Other Ambulatory Visit: Payer: Self-pay | Admitting: Internal Medicine

## 2019-10-26 NOTE — Telephone Encounter (Signed)
Refill Request.  

## 2020-01-19 ENCOUNTER — Ambulatory Visit: Payer: Medicare Other | Attending: Internal Medicine

## 2020-01-19 DIAGNOSIS — Z23 Encounter for immunization: Secondary | ICD-10-CM | POA: Insufficient documentation

## 2020-01-19 NOTE — Progress Notes (Signed)
   Covid-19 Vaccination Clinic  Name:  Frederick Miles    MRN: [id number] DOB: 1946/03/18  01/19/2020  [name] was observed post Covid-19 immunization for 15 minutes without incidence. He was provided with Vaccine Information Sheet and instruction to access the V-Safe system.   [name] was instructed to call 911 with any severe reactions post vaccine: Marland Kitchen Difficulty breathing  . Swelling of your face and throat  . A fast heartbeat  . A bad rash all over your body  . Dizziness and weakness    Immunizations Administered    Name Date Dose VIS Date Route   Pfizer COVID-19 Vaccine 01/19/2020  3:41 PM 0.3 mL 12/11/2019 Intramuscular   Manufacturer: [employer], [employer]   Lot: [id number]   [place]: [id number]

## 2020-01-29 ENCOUNTER — Ambulatory Visit: Payer: Medicare Other

## 2020-02-07 ENCOUNTER — Ambulatory Visit: Payer: Medicare Other | Attending: Internal Medicine

## 2020-02-07 DIAGNOSIS — Z23 Encounter for immunization: Secondary | ICD-10-CM

## 2020-02-07 NOTE — Progress Notes (Signed)
   Covid-19 Vaccination Clinic  Name:  Frederick Miles    MRN: [id number] DOB: 1946/11/04  02/07/2020  [name] was observed post Covid-19 immunization for 15 minutes without incidence. He was provided with Vaccine Information Sheet and instruction to access the V-Safe system.   [name] was instructed to call 911 with any severe reactions post vaccine: Marland Kitchen Difficulty breathing  . Swelling of your face and throat  . A fast heartbeat  . A bad rash all over your body  . Dizziness and weakness    Immunizations Administered    Name Date Dose VIS Date Route   Pfizer COVID-19 Vaccine 02/07/2020  8:16 AM 0.3 mL 12/11/2019 Intramuscular   Manufacturer: [place]   Lot: [id number]   [place]: [id number]

## 2020-02-10 ENCOUNTER — Ambulatory Visit: Payer: Medicare Other

## 2020-02-12 ENCOUNTER — Ambulatory Visit: Payer: Medicare Other

## 2020-02-25 ENCOUNTER — Other Ambulatory Visit: Payer: Self-pay | Admitting: Physician Assistant

## 2020-06-09 ENCOUNTER — Ambulatory Visit (HOSPITAL_COMMUNITY): Payer: Medicare Other | Attending: Cardiovascular Disease

## 2020-06-09 ENCOUNTER — Other Ambulatory Visit: Payer: Self-pay

## 2020-06-09 DIAGNOSIS — E785 Hyperlipidemia, unspecified: Secondary | ICD-10-CM | POA: Diagnosis not present

## 2020-06-09 DIAGNOSIS — I77810 Thoracic aortic ectasia: Secondary | ICD-10-CM

## 2020-06-09 DIAGNOSIS — I252 Old myocardial infarction: Secondary | ICD-10-CM | POA: Insufficient documentation

## 2020-06-09 DIAGNOSIS — E119 Type 2 diabetes mellitus without complications: Secondary | ICD-10-CM | POA: Insufficient documentation

## 2020-06-09 DIAGNOSIS — I10 Essential (primary) hypertension: Secondary | ICD-10-CM | POA: Insufficient documentation

## 2020-06-16 LAB — IFOBT (OCCULT BLOOD): IFOBT: NEGATIVE

## 2020-06-17 ENCOUNTER — Other Ambulatory Visit: Payer: Self-pay | Admitting: Physician Assistant

## 2020-06-26 ENCOUNTER — Other Ambulatory Visit: Payer: Self-pay | Admitting: Physician Assistant

## 2020-06-30 ENCOUNTER — Ambulatory Visit (INDEPENDENT_AMBULATORY_CARE_PROVIDER_SITE_OTHER): Payer: Medicare Other | Admitting: Cardiovascular Disease

## 2020-06-30 ENCOUNTER — Encounter: Payer: Self-pay | Admitting: Cardiovascular Disease

## 2020-06-30 ENCOUNTER — Other Ambulatory Visit: Payer: Self-pay

## 2020-06-30 VITALS — BP 118/70 | HR 55 | Ht 70.0 in | Wt 168.0 lb

## 2020-06-30 DIAGNOSIS — I714 Abdominal aortic aneurysm, without rupture, unspecified: Secondary | ICD-10-CM

## 2020-06-30 DIAGNOSIS — I10 Essential (primary) hypertension: Secondary | ICD-10-CM | POA: Diagnosis not present

## 2020-06-30 DIAGNOSIS — E782 Mixed hyperlipidemia: Secondary | ICD-10-CM | POA: Diagnosis not present

## 2020-06-30 DIAGNOSIS — I2510 Atherosclerotic heart disease of native coronary artery without angina pectoris: Secondary | ICD-10-CM | POA: Diagnosis not present

## 2020-06-30 MED ORDER — HYDROCHLOROTHIAZIDE 25 MG PO TABS: 25.0000 mg | ORAL_TABLET | Freq: Every day | ORAL | 3 refills | Status: DC

## 2020-06-30 NOTE — Patient Instructions (Signed)

## 2020-06-30 NOTE — Progress Notes (Signed)
Cardiology Office Note:    Date:  06/30/2020   ID:  Frederick Miles, DOB [birth year], MRN [id number]  PCP:  Shon Baton, MD  [facility] Cardiologist:  Sherren Mocha, MD  [place] Electrophysiologist:  None   Referring MD: Shon Baton, MD   Chief Complaint  Patient presents with  . Coronary Artery Disease    History of Present Illness:    Frederick Miles is a 74 y.o. male with a hx of paroxysmal atrial fibrillation, abdominal aortic aneurysm, type 2 diabetes, hypertension, and coronary artery disease, presenting for follow-up evaluation.  He was last seen in our office in 2019 by Dr End.  He has been previously followed by Dr Verl Blalock, Dr. Aundra Dubin, and Dr End.  This is his first visit with me.  The patient is anticoagulated with rivaroxaban.  The patient is here alone today. He is physically active. He walks 2 miles, 3-4 days/week. He plays golf and fishes regularly. Today, he denies symptoms of palpitations, chest pain, shortness of breath, orthopnea, PND, lower extremity edema, dizziness, or syncope.  He has had no symptoms of recurrent atrial fibrillation in many years. He has had no recurrent ischemic symptoms since his coronary stent in 2007.   Past Medical History:  Diagnosis Date  . AAA (abdominal aortic aneurysm) without rupture ([place]) 04/28/2017   Aorta US 1/18: 4.2 cm AAA  . Bladder cancer ([place]) 2006  . Cervicalgia   . Coronary atherosclerosis of native coronary artery    stent x1  . Diabetes mellitus without complication ([place])    typ2  . Esophageal reflux   . Esophageal stricture   . Heart murmur    slight  . History of cardiovascular stress test    Lexiscan Myoview 7/16: EF 66%, diaphragmatic attenuation, no ischemia, low risk  . History of kidney stones   . Hx of adenomatous colonic polyps   . Hyperlipidemia   . Hypertension   . Myocardial infarct ([place])    2007  . Unspecified hypothyroidism     Past Surgical History:  Procedure Laterality Date  .  CORONARY ANGIOPLASTY WITH STENT PLACEMENT  2007  . CYSTOSCOPY/RETROGRADE/URETEROSCOPY/STONE EXTRACTION WITH BASKET  08/08/2012   Procedure: CYSTOSCOPY/RETROGRADE/URETEROSCOPY/STONE EXTRACTION WITH BASKET;  Surgeon: Malka So, MD;  Location: [facility];  Service: Urology;  Laterality: Left;  Left Ureteroscopy with Stone Extraction  . SKIN TAG REMOVAL  07/09/06   right anterior thigh  . TONSILLECTOMY AND ADENOIDECTOMY    . TRANSURETHRAL RESECTION OF BLADDER  07/09/06   tumor 2-5 cm  . URETEROSCOPY WITH HOLMIUM LASER LITHOTRIPSY Left 01/06/2019   Procedure: LEFT URETEROSCOPY left retrograde pylegram basket extraction stone bladder biopsy fulgeration;  Surgeon: Irine Seal, MD;  [facility];  Service: Urology;  Laterality: Left;    Current Medications: Current Meds  Medication Sig  . atorvastatin (LIPITOR) 40 MG tablet Take 40 mg by mouth daily.    Marland Kitchen diltiazem (CARDIZEM CD) 120 MG 24 hr capsule Take 1 capsule (120 mg total) by mouth daily. Please keep upcoming appt in July with Dr. Burt Knack for future refills. Thank you  . empagliflozin (JARDIANCE) 10 MG TABS tablet Take 10 mg by mouth daily.   . hydrochlorothiazide (HYDRODIURIL) 25 MG tablet Take 1 tablet (25 mg total) by mouth daily. Please keep upcoming appt in July with Dr. Burt Knack before anymore refills. Thank you  . Insulin Glargine (BASAGLAR KWIKPEN [place]) Inject 20 Units into the skin daily with supper.   . insulin lispro (HUMALOG) 100 UNIT/ML  injection Inject 3 Units into the skin 3 (three) times daily with meals.   Marland Kitchen levothyroxine (SYNTHROID, LEVOTHROID) 125 MCG tablet Take 125 mcg by mouth daily before breakfast.   . lisinopril (ZESTRIL) 20 MG tablet TAKE 1 TABLET BY MOUTH EVERY DAY  . Magnesium 500 MG CAPS Take 500 mg by mouth daily.  . metFORMIN (GLUCOPHAGE) 500 MG tablet Take 500 mg by mouth daily.  Marland Kitchen omeprazole (PRILOSEC) 20 MG capsule Take 20 mg by mouth daily.    Alveda Reasons 20 MG TABS tablet TAKE 1 TABLET BY MOUTH DAILY WITH SUPPER  (Patient taking differently: Take 20 mg by mouth daily with supper. )     Allergies:   Patient has no known allergies.   Social History   Socioeconomic History  . Marital status: Married    Spouse name: Not on file  . Number of children: 2  . Years of education: Not on file  . Highest education level: Not on file  Occupational History    Employer: [employer]  Tobacco Use  . Smoking status: Former Smoker    Types: Cigarettes    Quit date: 08/05/1979    Years since quitting: 40.9  . Smokeless tobacco: Never Used  Vaping Use  . Vaping Use: Never used  Substance and Sexual Activity  . Alcohol use: Yes    Alcohol/week: 5.0 standard drinks    Types: 4 Glasses of wine, 1 Cans of beer per week  . Drug use: No  . Sexual activity: Yes  Other Topics Concern  . Not on file  Social History Narrative   Daily caffeine    Social Determinants of Health   Financial Resource Strain:   . Difficulty of Paying Living Expenses:   Food Insecurity:   . Worried About Charity fundraiser in the Last Year:   . Arboriculturist in the Last Year:   Transportation Needs:   . Film/video editor (Medical):   Marland Kitchen Lack of Transportation (Non-Medical):   Physical Activity:   . Days of Exercise per Week:   . Minutes of Exercise per Session:   Stress:   . Feeling of Stress :   Social Connections:   . Frequency of Communication with Friends and Family:   . Frequency of Social Gatherings with Friends and Family:   . Attends Religious Services:   . Active Member of Clubs or Organizations:   . Attends Archivist Meetings:   Marland Kitchen Marital Status:      Family History: The patient's family history includes Heart attack in his father, paternal grandfather, and paternal uncle; Heart disease in his father and mother. There is no history of Colon cancer or Stroke.  ROS:   Please see the history of present illness.    All other systems reviewed and are negative.  EKGs/Labs/Other  Studies Reviewed:    The following studies were reviewed today: Echo  06/09/20: IMPRESSIONS    1. Left ventricular ejection fraction, by estimation, is 60 to 65%. The  left ventricle has normal function. The left ventricle has no regional  wall motion abnormalities. Left ventricular diastolic parameters are  indeterminate.  2. Right ventricular systolic function is normal. The right ventricular  size is normal. There is normal pulmonary artery systolic pressure.  3. The mitral valve is normal in structure. No evidence of mitral valve  regurgitation. No evidence of mitral stenosis.  4. The aortic valve is normal in structure. Aortic valve regurgitation is  not  visualized. No aortic stenosis is present.  5. Aortic dilatation noted. 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   EKG:  EKG is ordered today.  The ekg ordered today demonstrates sinus bradycardia 55 bpm, within normal limits.  Recent Labs: No results found for requested labs within last 8760 hours.  Recent Lipid Panel    Component Value Date/Time   CHOL 123 03/03/2008 0948   TRIG 150 (H) 03/03/2008 0948   HDL 27.6 (L) 03/03/2008 0948   CHOLHDL 4.5 CALC 03/03/2008 0948   VLDL 30 03/03/2008 0948   LDLCALC 65 03/03/2008 0948    Physical Exam:    VS:  BP 118/70   Pulse (!) 55   Ht 5\' 10"  (1.778 m)   Wt 168 lb (76.2 kg)   SpO2 99%   BMI 24.11 kg/m     Wt Readings from Last 3 Encounters:  06/30/20 168 lb (76.2 kg)  06/26/19 165 lb (74.8 kg)  01/06/19 173 lb (78.5 kg)     GEN:  Well nourished, well developed in no acute distress HEENT: Normal NECK: No JVD; No carotid bruits LYMPHATICS: No lymphadenopathy CARDIAC: RRR, 2/6 systolic murmur at the RUSB, early peaking flow murmur RESPIRATORY:  Clear to auscultation without rales, wheezing or rhonchi  ABDOMEN: Soft, non-tender, non-distended MUSCULOSKELETAL:  No edema; No deformity  SKIN: Warm and dry NEUROLOGIC:  Alert and  oriented x 3 PSYCHIATRIC:  Normal affect   ASSESSMENT:    1. Coronary artery disease involving native coronary artery of native heart without angina pectoris   2. Essential hypertension   3. Mixed hyperlipidemia   4. AAA (abdominal aortic aneurysm) without rupture (HCC)    PLAN:    In order of problems listed above:  1. Stable without angina.  On rivaroxaban for atrial fibrillation, so no antiplatelet therapy as prescribed.  Patient is treated with an ACE inhibitor in the setting of type 2 diabetes.  LDL cholesterol is at goal of less than 70 mg/dL on 40 mg of atorvastatin daily. 2. Blood pressure is under ideal control on diltiazem, hydrochlorothiazide, and lisinopril.  Most recent creatinine is reviewed at 1.4 mg/dL with a potassium of 4.3 mg/dL. 3. Lipids with a cholesterol 123, HDL 39, LDL 59, triglycerides 127.  Continue atorvastatin 40 mg daily. 4. Followed closely by vascular surgery.  Notes reviewed.  Planned surveillance imaging at 3-year intervals.  CTA from 2019 showed stable findings dating back over a decade.   Medication Adjustments/Labs and Tests Ordered: Current medicines are reviewed at length with the patient today.  Concerns regarding medicines are outlined above.  Orders Placed This Encounter  Procedures  . EKG 12-Lead   No orders of the defined types were placed in this encounter.   Patient Instructions  Medication Instructions:  Your provider recommends that you continue on your current medications as directed. Please refer to the Current Medication list given to you today.   *If you need a refill on your cardiac medications before your next appointment, please call your pharmacy*  Follow-Up: At [facility], you and your health needs are our priority.  As part of our continuing mission to provide you with exceptional heart care, we have created designated Provider Care Teams.  These Care Teams include your primary Cardiologist (physician) and Advanced  Practice Providers (APPs -  Physician Assistants and Nurse Practitioners) who all work together to provide you with the care you need, when you need it. Your next appointment:   12  month(s) The format for your next appointment:   In Person Provider:   You may see Sherren Mocha, MD or one of the following Advanced Practice Providers on your designated Care Team:    Richardson Dopp, PA-C  Robbie Lis, [place]      Signed, Sherren Mocha, MD  06/30/2020 9:37 AM    [place]

## 2020-06-30 NOTE — Addendum Note (Signed)
Addended by: Harland German A on: 06/30/2020 10:09 AM   Modules accepted: Orders

## 2020-07-17 ENCOUNTER — Other Ambulatory Visit: Payer: Self-pay | Admitting: Cardiovascular Disease

## 2020-07-17 ENCOUNTER — Other Ambulatory Visit: Payer: Self-pay | Admitting: Internal Medicine

## 2020-07-18 NOTE — Telephone Encounter (Signed)
Refill request

## 2020-08-30 ENCOUNTER — Other Ambulatory Visit: Payer: Self-pay | Admitting: *Deleted

## 2020-08-30 ENCOUNTER — Other Ambulatory Visit: Payer: Self-pay

## 2020-08-30 DIAGNOSIS — I2510 Atherosclerotic heart disease of native coronary artery without angina pectoris: Secondary | ICD-10-CM

## 2020-08-30 DIAGNOSIS — I6529 Occlusion and stenosis of unspecified carotid artery: Secondary | ICD-10-CM

## 2020-08-30 NOTE — Progress Notes (Signed)
Chart opened in error

## 2020-09-02 ENCOUNTER — Encounter: Payer: Self-pay | Admitting: Nurse Practitioner

## 2020-09-08 ENCOUNTER — Other Ambulatory Visit: Payer: Self-pay | Admitting: Vascular Surgery

## 2020-09-08 ENCOUNTER — Telehealth: Payer: Self-pay | Admitting: *Deleted

## 2020-09-08 DIAGNOSIS — I714 Abdominal aortic aneurysm, without rupture, unspecified: Secondary | ICD-10-CM

## 2020-09-08 NOTE — Telephone Encounter (Signed)
Left VM for pt to call back. He will need to fast before his AAA [place] on 9/13.

## 2020-09-08 NOTE — Progress Notes (Addendum)
HISTORY AND PHYSICAL     CC:  follow up. Requesting Provider:  Shon Baton, MD  HPI: This is a 74 y.o. male who is here today for follow up for AAA that he has known about for several years & is accompanied by his wife Vaughan Basta.  He was last seen by Dr. Donzetta Matters in August 2019.  At that time, he did not have any new back or abdominal pain and remained quite active playing golf and fishing.  He had a CT scan 08/05/2018, which revealed stable thrombosed saccular aneurysm extending from the infrarenal abdominal aorta into the right CIA.  He did not have any walking limitations or hx of stroke sx.  He did have well controlled DM.  He does have a hx of CAD and PAF  The pt returns today for follow up.  He states he has been doing well.  He does not have any claudication, rest pain or non healing wounds.  He denies any sharp abdominal or back pain.  He states he does have some numbness in the left great toe occasionally.   He states he does walk and performs ADL's without any difficulty.  He is diabetic.  He denies any recent MI or stroke.  He states he has hx of bladder cancer after having blood in his urine.  He had a CT scan in the past month at [facility].  He was told that his aneurysm was stable.  The results of this are not in [place].    The pt is on a statin for cholesterol management.    The pt is not on an aspirin.    Other AC:  Xarelto for PAF The pt is on ACEI, CCB for hypertension.  The pt does have diabetes. Tobacco hx:  former  Pt does have have family hx of AAA.  His father had AAA repair when he was 31 and lived until age 62.    Past Medical History:  Diagnosis Date  . AAA (abdominal aortic aneurysm) without rupture ([place]) 04/28/2017   Aorta US 1/18: 4.2 cm AAA  . Bladder cancer ([place]) 2006  . Cervicalgia   . Coronary atherosclerosis of native coronary artery    stent x1  . Diabetes mellitus without complication ([place])    typ2  . Esophageal reflux   . Esophageal stricture   . Heart  murmur    slight  . History of cardiovascular stress test    Lexiscan Myoview 7/16: EF 66%, diaphragmatic attenuation, no ischemia, low risk  . History of kidney stones   . Hx of adenomatous colonic polyps   . Hyperlipidemia   . Hypertension   . Myocardial infarct ([place])    2007  . Unspecified hypothyroidism     Past Surgical History:  Procedure Laterality Date  . CORONARY ANGIOPLASTY WITH STENT PLACEMENT  2007  . CYSTOSCOPY/RETROGRADE/URETEROSCOPY/STONE EXTRACTION WITH BASKET  08/08/2012   Procedure: CYSTOSCOPY/RETROGRADE/URETEROSCOPY/STONE EXTRACTION WITH BASKET;  Surgeon: Malka So, MD;  [facility];  Service: Urology;  Laterality: Left;  Left Ureteroscopy with Stone Extraction  . SKIN TAG REMOVAL  07/09/06   right anterior thigh  . TONSILLECTOMY AND ADENOIDECTOMY    . TRANSURETHRAL RESECTION OF BLADDER  07/09/06   tumor 2-5 cm  . URETEROSCOPY WITH HOLMIUM LASER LITHOTRIPSY Left 01/06/2019   Procedure: LEFT URETEROSCOPY left retrograde pylegram basket extraction stone bladder biopsy fulgeration;  Surgeon: Irine Seal, MD;  [facility];  Service: Urology;  Laterality: Left;    No Known  Allergies  Current Outpatient Medications  Medication Sig Dispense Refill  . atorvastatin (LIPITOR) 40 MG tablet Take 40 mg by mouth daily.      Marland Kitchen diltiazem (CARDIZEM CD) 120 MG 24 hr capsule TAKE 1 CAPSULE BY MOUTH EVERY DAY 90 capsule 3  . empagliflozin (JARDIANCE) 10 MG TABS tablet Take 10 mg by mouth daily.     . hydrochlorothiazide (HYDRODIURIL) 25 MG tablet Take 1 tablet (25 mg total) by mouth daily. 90 tablet 3  . Insulin Glargine (BASAGLAR KWIKPEN [place]) Inject 20 Units into the skin daily with supper.     . insulin lispro (HUMALOG) 100 UNIT/ML injection Inject 3 Units into the skin 3 (three) times daily with meals.     Marland Kitchen levothyroxine (SYNTHROID, LEVOTHROID) 125 MCG tablet Take 125 mcg by mouth daily before breakfast.     . lisinopril (ZESTRIL) 20 MG tablet TAKE 1 TABLET BY MOUTH  EVERY DAY 90 tablet 2  . Magnesium 500 MG CAPS Take 500 mg by mouth daily.    . metFORMIN (GLUCOPHAGE) 500 MG tablet Take 500 mg by mouth daily.    . nitroGLYCERIN (NITROSTAT) 0.4 MG SL tablet Place 1 tablet (0.4 mg total) under the tongue every 5 (five) minutes as needed for chest pain. 25 tablet 6  . omeprazole (PRILOSEC) 20 MG capsule Take 20 mg by mouth daily.      Alveda Reasons 20 MG TABS tablet TAKE 1 TABLET BY MOUTH DAILY WITH SUPPER (Patient taking differently: Take 20 mg by mouth daily with supper. ) 30 tablet 5   No current facility-administered medications for this visit.    Family History  Problem Relation Age of Onset  . Heart disease Father   . Heart attack Father   . Heart disease Mother   . Heart attack Paternal Grandfather   . Heart attack Paternal Uncle   . Colon cancer Neg Hx   . Stroke Neg Hx     Social History   Socioeconomic History  . Marital status: Married    Spouse name: Not on file  . Number of children: 2  . Years of education: Not on file  . Highest education level: Not on file  Occupational History    Employer: [employer]  Tobacco Use  . Smoking status: Former Smoker    Types: Cigarettes    Quit date: 08/05/1979    Years since quitting: 41.1  . Smokeless tobacco: Never Used  Vaping Use  . Vaping Use: Never used  Substance and Sexual Activity  . Alcohol use: Yes    Alcohol/week: 5.0 standard drinks    Types: 4 Glasses of wine, 1 Cans of beer per week  . Drug use: No  . Sexual activity: Yes  Other Topics Concern  . Not on file  Social History Narrative   Daily caffeine    Social Determinants of Health   Financial Resource Strain:   . Difficulty of Paying Living Expenses: Not on file  Food Insecurity:   . Worried About Charity fundraiser in the Last Year: Not on file  . Ran Out of Food in the Last Year: Not on file  Transportation Needs:   . Lack of Transportation (Medical): Not on file  . Lack of Transportation  (Non-Medical): Not on file  Physical Activity:   . Days of Exercise per Week: Not on file  . Minutes of Exercise per Session: Not on file  Stress:   . Feeling of Stress : Not on file  Social Connections:   . Frequency of Communication with Friends and Family: Not on file  . Frequency of Social Gatherings with Friends and Family: Not on file  . Attends Religious Services: Not on file  . Active Member of Clubs or Organizations: Not on file  . Attends Archivist Meetings: Not on file  . Marital Status: Not on file  Intimate Partner Violence:   . Fear of Current or Ex-Partner: Not on file  . Emotionally Abused: Not on file  . Physically Abused: Not on file  . Sexually Abused: Not on file     REVIEW OF SYSTEMS:   [X]  denotes positive finding, [ ]  denotes negative finding Cardiac  Comments:  Chest pain or chest pressure:    Shortness of breath upon exertion:    Short of breath when lying flat:    Irregular heart rhythm:        Vascular    Pain in calf, thigh, or hip brought on by ambulation:    Pain in feet at night that wakes you up from your sleep:     Blood clot in your veins:    Leg swelling:         Pulmonary    Oxygen at home:    Productive cough:     Wheezing:         Neurologic    Sudden weakness in arms or legs:     Sudden numbness in arms or legs:     Sudden onset of difficulty speaking or slurred speech:    Temporary loss of vision in one eye:     Problems with dizziness:         Gastrointestinal    Blood in stool:     Vomited blood:         Genitourinary    Blood in urine: x See HPI      Psychiatric    Major depression:         Hematologic    Bleeding problems:    Problems with blood clotting too easily:        Skin    Rashes or ulcers:        Constitutional    Fever or chills:      PHYSICAL EXAMINATION:  Today's Vitals   09/12/20 1053  BP: 114/74  Pulse: (!) 58  Resp: 20  Temp: 98.2 F (36.8 C)  TempSrc: Temporal  SpO2:  99%  Weight: 169 lb 11.2 oz (77 kg)  Height: 5\' 10"  (1.778 m)   Body mass index is 24.35 kg/m.   General:  WDWN in NAD; vital signs documented above Gait: Not observed HENT: WNL, normocephalic Pulmonary: normal non-labored breathing , without wheezing Cardiac: regular HR, without  Murmur; without carotid bruits Abdomen: soft, NT, no masses; aortic pulse is not palpable Skin: without rashes Vascular Exam/Pulses:  Right Left  Radial 2+ (normal) trace  Ulnar Unable to palpate Unable to palpate  Femoral 2+ (normal) 2+ (normal)  Popliteal Unable to palpate Unable to palpate  DP 2+ (normal) absent  PT 2+ (normal) Monophasic doppler  Peroneal Biphasic doppler Monophasic doppler   Extremities: without ischemic changes, without Gangrene , without cellulitis; without open wounds;  Musculoskeletal: no muscle wasting or atrophy  Neurologic: A&O X 3;  No focal weakness or paresthesias are detected Psychiatric:  The pt has Normal affect.   Non-Invasive Vascular Imaging:   Arterial duplex on 09/12/2020: Abdominal Aorta Findings:  +-----------+-------+----------+----------+----------+--------+--------+  Location  AP (cm)Trans (cm)PSV (  cm/s)Waveform ThrombusComments  +-----------+-------+----------+----------+----------+--------+--------+  Proximal  2.30  2.40   86                   +-----------+-------+----------+----------+----------+--------+--------+  Distal   5.09  4.25   90                   +-----------+-------+----------+----------+----------+--------+--------+  RT CIA Prox1.9  1.8    129    biphasic     saccular  +-----------+-------+----------+----------+----------+--------+--------+  LT CIA Prox1.8  7.7    148    monophasic          +-----------+-------+----------+----------+----------+--------+--------+   Summary:  Abdominal Aorta: Distal aorta aneurysm present  with thrombus/plaque.  Unable to adequately determine posterior wall of the distal aorta due to  shadowing. Current measurement may be overesimated. 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 Alternatively, this could represent a chronically thrombosed false lumen from an aortic dissection. This eccentric thrombosed aneurysm/dissection has been present for many years and no change since 2017    ASSESSMENT/PLAN:: 74 y.o. male here for follow up for known AAA  AAA -on u/s today, it reveals that AAA measures 5.5, however, states the current measurement could be overestimated due to shadowing.  I discussed with pt that I would like to get a CTA to evaluate the size.  He tells me he had a CT scan at [facility] in the past few weeks.  I do not have those results here.  He has signed a release and we will try to get those results.  I discussed with pt that this may not be the type of CT scan to evaluate and may need a different type of scan but we will check on this.  He and his wife state they were told that his aneurysm was stable. Which would suggest the size today is overestimated.    I did discuss with he and his wife about open repair vs EVAR briefly.   -I let them know I will call them in the next couple of days after I have the results.  I will call Linda's cell phone [phone]). -discussed that should he develop any sudden sharp abdominal or back pain that he should call 911 & proceed to [facility] ER immediately  -of note, he does not have palpable pedal pulses on the left and doppler signals are monophasic.  He does not have any claudication, rest pain or ulcers.  I discussed with him if he develops any wounds that won't heal, we need to see him back sooner.  Will check ABI's when he returns.  The right pedal pulses are palpable.    -pt will f/u in one year with AAA duplex and ABI. -he will call sooner if he has any issues.    Leontine Locket, [facility] [phone]  Clinic MD:   Trula Slade   ADDENDUM: Discussed pt with Dr. Donzetta Matters and he was able to review pt's CT scan from 08/27/2020.  The aneurysm remains unchanged.  Will have pt f/u in one year with AAA duplex and hopeful to get better pictures as well as ABI's in one year given he does not have palpable pedal pulses on the left-see above.  I called and discussed with Vaughan Basta, pt's wife as we had talked about yesterday.  She expressed understanding.  They will call sooner if they have  any issues arise prior to their next appointment.   Leontine Locket, [facility] 09/13/2020 2:07 PM

## 2020-09-12 ENCOUNTER — Other Ambulatory Visit: Payer: Self-pay

## 2020-09-12 ENCOUNTER — Ambulatory Visit: Payer: Medicare Other | Admitting: Physician Assistant

## 2020-09-12 ENCOUNTER — Ambulatory Visit (HOSPITAL_COMMUNITY)
Admission: RE | Admit: 2020-09-12 | Discharge: 2020-09-12 | Disposition: A | Discharge status: Home / Self Care | Payer: Medicare Other | Source: Ambulatory Visit | Attending: Vascular Surgery | Admitting: Vascular Surgery

## 2020-09-12 VITALS — BP 114/74 | HR 58 | Temp 98.2°F | Resp 20 | Ht 70.0 in | Wt 169.7 lb

## 2020-09-12 DIAGNOSIS — I714 Abdominal aortic aneurysm, without rupture, unspecified: Secondary | ICD-10-CM

## 2020-10-09 NOTE — Progress Notes (Signed)
10/10/2020 TELLY JAWAD [id number] [birth year]   CHIEF COMPLAINT: Recent CT showed rectal abnormality   HISTORY OF PRESENT ILLNESS: Berneta Sages B. Gabrielle is a 74 year old male with a past medical history of hypertension, coronary artery disease MI s/p stent x 1 2007, paroxysmal atrial fibrillation on Xarelto, AAA, hyperlipidemia, hypothyroidism, DM II, bladder cancer 2006, acid reflux, esophageal stricture, and a colon polyp in 2003.  He presents to our office today as recommended by his urologist due to having an abd/pelvic CT scan which showed an abnormality to his rectum.  The abdominal/pelvic CT scan was ordered due to recent hematuria.  His wife is present.  Our office contacted [facility] and a copy of an abd/pelvic CT scan 08/26/2020 was received. Results as follows:  1.  Bilateral nonobstructing renal stones, no ureteral or bladder stones.  No secondary changes in either kidney or ureter.  No other findings to explain hematuria. 2.  Hyperenhancement in the distal rectum.  This is nonspecific, but could represent infectious/inflammatory etiology. 3.  Diffuse wall thickening in the proximal and mid stomach may be related to underdistention.  Gastritis would be a consideration. 4.  Left colonic diverticulosis without diverticulitis. 5.  Aortic atherosclerosis. 6.  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  Appearance is stable since 2017.   He denies having any fatigue.  His energy level is good.  His appetite stable.  No weight loss.  He denies having any dysphagia, heartburn or stomach pain.  No lower abdominal pain.  He is passing a normal brown formed bowel movement every morning.  No rectal bleeding.  No melena.  He underwent a colonoscopy by Dr. Maurene Capes in 2003 and a 10 mm tubular adenomatous polyp was removed from the sigmoid  colon.  A repeat colonoscopy in 2006 was normal, no polyps.  His most recent colonoscopy by Dr. Maurene Capes was on 06/11/2012 which identified internal hemorrhoids and diverticulosis.  No polyps.  He was advised to repeat a colonoscopy in June 2023.  No family history of colon cancer.  He has a history of bladder cancer in 2006 which was treated with "bladder wash" without radiation.  He is followed closely by urology.  History of heart disease and atrial fibrillation followed by cardiologist Dr. Burt Knack.  He remains on Xarelto.  History of an AAA followed by vascular surgery.  Denies having any chest pain or palpitations.  His most recent laboratory studies were collected by his PCP less than 1 year ago.  I will request a copy of his lab results for further review.   Colonoscopy 06/11/2012 by Dr. Olevia Perches:  Diverticulosis Internal hemorrhoids 10 year recall   Colonoscopy 02/27/2005: No polyps    Colonoscopy 03/05/2002:  60mm tubular adenomatous polyp removed from the sigmoid colon Sigmoid diverticulosis  EGD 08/19/2003: Esophageal stricture S/P esophageal dilation to 54F   ECHO 06/09/2020: 1. Left ventricular ejection fraction, by estimation, is 60 to 65%. The left ventricle has normal function. The left ventricle has no regional wall motion abnormalities. Left ventricular diastolic parameters are indeterminate. 2. Right ventricular systolic function is normal. The right ventricular size is normal. There is normal pulmonary artery systolic pressure. 3. The mitral valve is normal in structure. No evidence of mitral valve regurgitation. No evidence of mitral stenosis. 4. The aortic valve is normal in structure. Aortic valve regurgitation is not visualized. No aortic stenosis  is present. 5. Aortic dilatation noted. 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  Past Medical History:  Diagnosis Date  . AAA (abdominal aortic aneurysm) without rupture ([place]) 04/28/2017   Aorta US  1/18: 4.2 cm AAA  . Bladder cancer ([place]) 2006  . Cervicalgia   . Coronary atherosclerosis of native coronary artery    stent x1  . Diabetes mellitus without complication ([place])    typ2  . Esophageal reflux   . Esophageal stricture   . Heart murmur    slight  . History of cardiovascular stress test    Lexiscan Myoview 7/16: EF 66%, diaphragmatic attenuation, no ischemia, low risk  . History of kidney stones   . Hx of adenomatous colonic polyps   . Hyperlipidemia   . Hypertension   . Myocardial infarct ([place])    2007  . Unspecified hypothyroidism    Past Surgical History:  Procedure Laterality Date  . CORONARY ANGIOPLASTY WITH STENT PLACEMENT  2007  . CYSTOSCOPY/RETROGRADE/URETEROSCOPY/STONE EXTRACTION WITH BASKET  08/08/2012   Procedure: CYSTOSCOPY/RETROGRADE/URETEROSCOPY/STONE EXTRACTION WITH BASKET;  Surgeon: Malka So, MD;  Location: [facility];  Service: Urology;  Laterality: Left;  Left Ureteroscopy with Stone Extraction  . SKIN TAG REMOVAL  07/09/06   right anterior thigh  . TONSILLECTOMY AND ADENOIDECTOMY    . TRANSURETHRAL RESECTION OF BLADDER  07/09/06   tumor 2-5 cm  . URETEROSCOPY WITH HOLMIUM LASER LITHOTRIPSY Left 01/06/2019   Procedure: LEFT URETEROSCOPY left retrograde pylegram basket extraction stone bladder biopsy fulgeration;  Surgeon: Irine Seal, MD;  Location: [facility];  Service: Urology;  Laterality: Left;   Social History:  He is married. He has 2 sons. He quit smoking cigarettes 40+ years ago. He drinks 1 glass of wine nightly.  No drug use.   Family History:  Mother died 35 kidney failure and diabetes. Father died age 23 heart disease, bypass surgery. He has 3 brother and 3 sister no cancer or heart disease.   No Known Allergies    Outpatient Encounter Medications as of 10/10/2020  Medication Sig  . atorvastatin (LIPITOR) 40 MG tablet Take 40 mg by mouth daily.    . Continuous Blood Gluc Sensor (FREESTYLE LIBRE 2 SENSOR) MISC CHANGE EVERY 14 DAYS TO MONITOR  BLOOD GLUCOSE CONTINUOUSLY  . diltiazem (CARDIZEM CD) 120 MG 24 hr capsule TAKE 1 CAPSULE BY MOUTH EVERY DAY  . empagliflozin (JARDIANCE) 10 MG TABS tablet Take 10 mg by mouth daily.   . hydrochlorothiazide (HYDRODIURIL) 25 MG tablet Take 1 tablet (25 mg total) by mouth daily.  . Insulin Glargine (BASAGLAR KWIKPEN [place]) Inject 20 Units into the skin daily with supper.   . insulin lispro (HUMALOG) 100 UNIT/ML injection Inject 3 Units into the skin 3 (three) times daily with meals.   [place] Kitchen levothyroxine (SYNTHROID, LEVOTHROID) 125 MCG tablet Take 125 mcg by mouth daily before breakfast.   . lisinopril (ZESTRIL) 20 MG tablet TAKE 1 TABLET BY MOUTH EVERY DAY  . Magnesium 500 MG CAPS Take 500 mg by mouth daily.  . metFORMIN (GLUCOPHAGE) 500 MG tablet Take 500 mg by mouth daily.  Marland Kitchen omeprazole (PRILOSEC) 20 MG capsule Take 20 mg by mouth daily.    Alveda Reasons 20 MG TABS tablet TAKE 1 TABLET BY MOUTH DAILY WITH SUPPER (Patient taking differently: Take 20 mg by mouth daily with supper. )  . nitroGLYCERIN (NITROSTAT) 0.4 MG SL tablet Place 1 tablet (0.4 mg total) under the tongue every 5 (five) minutes as needed  for chest pain.  . Sodium Sulfate-Mag Sulfate-KCl (SUTAB) [phone] MG TABS Take 1 kit by mouth as directed.   No facility-administered encounter medications on file as of 10/10/2020.    REVIEW OF SYSTEMS: Gen: Denies fever, sweats or chills. No weight loss.  CV: Denies chest pain, palpitations or edema. Resp: Denies cough, shortness of breath of hemoptysis.  GI: See HPI.  GU : See HPI.  MS: Denies joint pain, muscles aches or weakness. Derm: Denies rash, itchiness, skin lesions or unhealing ulcers. Psych: Denies depression, anxiety, memory loss, suicidal ideation and confusion. Heme: Denies bruising, bleeding. Neuro:  Denies headaches, dizziness or paresthesias. Endo:  Denies any problems with DM, thyroid or adrenal function.   PHYSICAL EXAM: BP 124/70   Pulse (!) 58   Ht $R'5\' 10"'vN$  (1.778  m)   Wt 168 lb (76.2 kg)   BMI 24.11 kg/m   General: Well developed 74 year old male in no acute distress. Head: Normocephalic and atraumatic. Eyes:  Sclerae non-icteric, conjunctive pink. Ears: Normal auditory acuity. Mouth: Dentition intact. Few missing teeth. No ulcers or lesions.  Neck: Supple, no lymphadenopathy or thyromegaly.  Lungs: Clear bilaterally to auscultation without wheezes, crackles or rhonchi. Diminished breath sounds throughout.  Heart: Regular rate and rhythm. No murmur, rub or gallop appreciated.  Abdomen: Soft, nontender, non distended. No masses. No hepatosplenomegaly. Normoactive bowel sounds x 4 quadrants.  Rectal: Deferred, patient elects to proceed with a colonoscopy for further evaluation.  Musculoskeletal: Symmetrical with no gross deformities. Skin: Warm and dry. No rash or lesions on visible extremities. Extremities: No edema. Neurological: Alert oriented x 4, no focal deficits.  Psychological:  Alert and cooperative. Normal mood and affect.  ASSESSMENT AND PLAN:  41. 74 year old male with a history of bladder cancer 2006 underwent a CTAP 08/26/2020 due to recent hematuria which identified gastric wall thickening and hyperenhancement in the rectum, these are nonspecific finding. He is asymptomatic.  -EGD and colonoscopy benefits and risks discussed including risk with sedation, risk of bleeding, perforation and infection  -Dr. Tarri Glenn to review the patient's CTAP and above plan -Our office will contact cardiologist Dr. Burt Knack to verify Xarelto instructions prior to EGD and colonoscopy   2. History of a 10 mm tubular adenomatous polyp in 2003. Last colonoscopy in 2013 no polyps.  -See plan in # 1  3. GERD, remote history of an esophageal stricture. No dysphagia.   4. CAD, MI s/p stent x 1 in 2007  5. Paroxysmal atrial fibrillation on Xarelto   6. AAA, stable per CT, followed closely by vascular surgery      CC:  Shon Baton, MD

## 2020-10-10 ENCOUNTER — Telehealth: Payer: Self-pay

## 2020-10-10 ENCOUNTER — Ambulatory Visit: Payer: Medicare Other | Admitting: Nurse Practitioner

## 2020-10-10 ENCOUNTER — Encounter: Payer: Self-pay | Admitting: Nurse Practitioner

## 2020-10-10 VITALS — BP 124/70 | HR 58 | Ht 70.0 in | Wt 168.0 lb

## 2020-10-10 DIAGNOSIS — R9389 Abnormal findings on diagnostic imaging of other specified body structures: Secondary | ICD-10-CM

## 2020-10-10 DIAGNOSIS — Z8551 Personal history of malignant neoplasm of bladder: Secondary | ICD-10-CM | POA: Diagnosis not present

## 2020-10-10 DIAGNOSIS — K3189 Other diseases of stomach and duodenum: Secondary | ICD-10-CM | POA: Diagnosis not present

## 2020-10-10 MED ORDER — SUTAB 1479-225-188 MG PO TABS: 1.0000 | ORAL_TABLET | ORAL | 0 refills | Status: DC

## 2020-10-10 NOTE — Telephone Encounter (Signed)
   Primary Cardiologist: Sherren Mocha, MD  Chart reviewed as part of pre-operative protocol coverage. Given past medical history and time since last visit, based on ACC/AHA guidelines, Frederick Miles would be at acceptable risk for the planned procedure without further cardiovascular testing.   Patient with diagnosis of NVAF on Rivaroxaban for anticoagulation.    Procedure: Endoscopy  Date of procedure: 11/18/20  CHADS2-VASc score of 4  (HTN, AGE, DM2, CAD)  CrCl 50.4 Platelet count 182  Per office protocol, patient can hold rivaroxaban for 2 days prior to procedure.  I will route this recommendation to the requesting party via Epic fax function and remove from pre-op pool.  Please call with questions.  [name]. [name] NP-C    10/10/2020, 12:13 PM [address] Office [phone] Fax [phone]

## 2020-10-10 NOTE — Progress Notes (Signed)
Reviewed and agree with management plans. ? ?Jahaad Penado L. Jeramine Delis, MD, MPH  ?

## 2020-10-10 NOTE — Telephone Encounter (Signed)
Patient with diagnosis of NVAF on Rivaroxaban for anticoagulation.    Procedure: Endoscopy  Date of procedure: 11/18/20  CHADS2-VASc score of 4  (HTN, AGE, DM2, CAD)  CrCl 50.4 Platelet count 182  Per office protocol, patient can hold rivaroxaban for 2 days prior to procedure.

## 2020-10-10 NOTE — Telephone Encounter (Signed)
[facility] Pre-operative Risk Assessment     Request for surgical clearance:     Endoscopy Procedure  What type of surgery is being performed?     Endoscopy/Colonoscopy  When is this surgery scheduled?     11/18/20  What type of clearance is required ?   Pharmacy  Are there any medications that need to be held prior to surgery and how long? Xarelto 2 days prior  Practice name and name of physician performing surgery?      [facility]  What is your office phone and fax number?      Phone- [phone]  [phone]  Anesthesia type (None, local, MAC, general) ?       MAC

## 2020-10-10 NOTE — Patient Instructions (Addendum)
If you are age 73 or older, your body mass index should be between 23-30. Your Body mass index is 24.11 kg/m. If this is out of the aforementioned range listed, please consider follow up with your Primary Care Provider.  If you are age 24 or younger, your body mass index should be between 19-25. Your Body mass index is 24.11 kg/m. If this is out of the aformentioned range listed, please consider follow up with your Primary Care Provider.   You have been scheduled for an endoscopy and colonoscopy. Please follow the written instructions given to you at your visit today. Please pick up your prep supplies at the pharmacy within the next 1-3 days. If you use inhalers (even only as needed), please bring them with you on the day of your procedure.  You will be contacted by our office prior to your procedure for directions on holding your Xarelto.  If you do not hear from our office 1 week prior to your scheduled procedure, please call [phone] to discuss.   Follow up will be determined by the results of your Endoscopy/Colonoscopy.

## 2020-10-12 NOTE — Telephone Encounter (Signed)
Patient has been contacted and advised when to take the last dose of his Xarelto (11/15/20) he will hold his Xarelto the next 2 days and restart after his procedure unless told other wise. Patient expressed understanding.

## 2020-11-14 ENCOUNTER — Telehealth: Payer: Self-pay | Admitting: Nurse Practitioner

## 2020-11-14 NOTE — Telephone Encounter (Signed)
Nila Nephew [facility] October. Called to confirm the pharmacy had the Rx. Lanelle Bal said it is on file and it will need to be ordered. Asked if she would do this so the patient can pick it up tomorrow. EGD/colon 11/18/20. Called the phone number given. Mr Kolodziejski answers. He does not want to move the procedure. He does not know where the instructions are. Discussed with him the role of clear liquids in the preparation. Reviewed his diabetic medications and the instructions for them. He will speak with his spouse. Call back if there are still concerns.

## 2020-11-14 NOTE — Telephone Encounter (Signed)
Pt's wife is requesting for the SUTAB be called in to his pharmacy, [facility].  Pt's wife is also concerned the pt is scheduled at 3:00pm for the procedure but he is diabetic.

## 2020-11-15 ENCOUNTER — Telehealth: Payer: Self-pay | Admitting: Gastroenterology

## 2020-11-15 ENCOUNTER — Encounter: Payer: Self-pay | Admitting: Gastroenterology

## 2020-11-15 MED ORDER — SUTAB 1479-225-188 MG PO TABS: 1.0000 | ORAL_TABLET | ORAL | 0 refills | Status: DC

## 2020-11-15 NOTE — Telephone Encounter (Signed)
Prep has been resent to his pharmacy, with [place] code.

## 2020-11-15 NOTE — Telephone Encounter (Signed)
Please refer below 

## 2020-11-18 ENCOUNTER — Other Ambulatory Visit: Payer: Self-pay

## 2020-11-18 ENCOUNTER — Encounter: Payer: Self-pay | Admitting: Gastroenterology

## 2020-11-18 ENCOUNTER — Telehealth: Payer: Self-pay

## 2020-11-18 ENCOUNTER — Ambulatory Visit (AMBULATORY_SURGERY_CENTER): Payer: Medicare Other | Admitting: Gastroenterology

## 2020-11-18 VITALS — BP 105/64 | HR 56 | Temp 98.4°F | Resp 11 | Ht 70.0 in | Wt 168.0 lb

## 2020-11-18 DIAGNOSIS — D125 Benign neoplasm of sigmoid colon: Secondary | ICD-10-CM

## 2020-11-18 DIAGNOSIS — K3189 Other diseases of stomach and duodenum: Secondary | ICD-10-CM | POA: Diagnosis not present

## 2020-11-18 DIAGNOSIS — K2980 Duodenitis without bleeding: Secondary | ICD-10-CM | POA: Diagnosis not present

## 2020-11-18 DIAGNOSIS — R9389 Abnormal findings on diagnostic imaging of other specified body structures: Secondary | ICD-10-CM | POA: Diagnosis not present

## 2020-11-18 DIAGNOSIS — K5730 Diverticulosis of large intestine without perforation or abscess without bleeding: Secondary | ICD-10-CM

## 2020-11-18 DIAGNOSIS — K6389 Other specified diseases of intestine: Secondary | ICD-10-CM | POA: Diagnosis not present

## 2020-11-18 DIAGNOSIS — K635 Polyp of colon: Secondary | ICD-10-CM

## 2020-11-18 DIAGNOSIS — D122 Benign neoplasm of ascending colon: Secondary | ICD-10-CM

## 2020-11-18 DIAGNOSIS — K648 Other hemorrhoids: Secondary | ICD-10-CM

## 2020-11-18 MED ORDER — SODIUM CHLORIDE 0.9 % IV SOLN: 500.0000 mL | Freq: Once | INTRAVENOUS | Status: DC

## 2020-11-18 NOTE — Progress Notes (Signed)
To PACU, VSS. Report to [email or web address] 

## 2020-11-18 NOTE — Progress Notes (Signed)
PT took Mag citrate this morning per Dr. Legrand Pitts after completing the Sutabs with yellow liquid as the result.  CW vitlas and JD IV.

## 2020-11-18 NOTE — Telephone Encounter (Signed)
Pt is scheduled to see Dr. Tarri Glenn at 1:30. Pt wife stated that after he drank the second prep he only had 3 bowel movements. Pt stated that last bowel movement was liquid with a medium brown color to it, and there was some pieces of stool in it. Pt's wife stated that she has a fleet enema and suppositories at the house to give if needed. Spoke to MD and she advise for patient to do a dose of Mag Citrate, and we will proceed with procedure as scheduled.

## 2020-11-18 NOTE — Op Note (Signed)
[facility] Patient Name: Frederick Miles Procedure Date: 11/18/2020 1:20 PM MRN: [id number] Endoscopist: Thornton Park MD, MD Age: 74 Referring MD:  Date of Birth: [birth year] Gender: Male Account #: [id number] Procedure:                Colonoscopy Indications:              Abnormal CT of the GI tract                           - CT abd/pelvis with and without contrast 08/26/20:                            Left colon diverticulosis without diverticulitis.                            Hyperenhancement in the distal rectum - noted to be                            nonspecific. Nonobstructing renal stones.                           - Colonoscopy by Dr. Maurene Capes in 2003 and a 10 mm                            tubular adenomatous polyp was removed from the                            sigmoid colon. A repeat colonoscopy in 2006 was                            normal, no polyps. His most recent colonoscopy by                            Dr. Maurene Capes was on 06/11/2012 which identified                            internal hemorrhoids and diverticulosis. No polyps.                            He was advised to repeat a colonoscopy in June 2023.                           - No known family history of colon cancer. Medicines:                Monitored Anesthesia Care Procedure:                Pre-Anesthesia Assessment:                           - Prior to the procedure, a History and Physical                            was performed, and patient medications and  allergies were reviewed. The patient's tolerance of                            previous anesthesia was also reviewed. The risks                            and benefits of the procedure and the sedation                            options and risks were discussed with the patient.                            All questions were answered, and informed consent                            was obtained. Prior Anticoagulants: The  patient has                            taken Xarelto (rivaroxaban), last dose was 2 days                            prior to procedure. ASA Grade Assessment: III - A                            patient with severe systemic disease. After                            reviewing the risks and benefits, the patient was                            deemed in satisfactory condition to undergo the                            procedure.                           After obtaining informed consent, the colonoscope                            was passed under direct vision. Throughout the                            procedure, the patient's blood pressure, pulse, and                            oxygen saturations were monitored continuously. The                            Colonoscope was introduced through the anus and                            advanced to the 3 cm into the ileum. The  colonoscopy was performed with moderate difficulty                            due to poor bowel prep. The patient tolerated the                            procedure well. The quality of the bowel                            preparation was adequate to identify polyps 6 mm                            and larger in size. The terminal ileum, ileocecal                            valve, appendiceal orifice, and rectum were                            photographed. Scope In: 1:44:33 PM Scope Out: 2:07:20 PM Scope Withdrawal Time: 0 hours 14 minutes 51 seconds  Total Procedure Duration: 0 hours 22 minutes 47 seconds  Findings:                 Non-bleeding external and internal hemorrhoids were                            found.                           Multiple small and large-mouthed diverticula were                            found in the sigmoid colon and distal descending                            colon.                           A 2 mm polyp was found in the sigmoid colon. The                             polyp was flat. The polyp was removed with a cold                            snare. Resection and retrieval were complete.                            Estimated blood loss was minimal.                           A 1 mm polyp was found in the ascending colon. The                            polyp was flat. The polyp was removed with a cold  snare. Resection and retrieval were complete.                            Estimated blood loss was minimal.                           A 3 mm polyp was found in the ascending colon. The                            polyp was sessile. The polyp was removed with a                            cold snare. Resection and retrieval were complete.                            Estimated blood loss was minimal.                           The exam was otherwise without abnormality on                            direct and retroflexion views. However, the exam is                            limited due to retained stool. In areas flat or                            small polyps could be missed. Complications:            No immediate complications. Estimated blood loss:                            Minimal. Estimated Blood Loss:     Estimated blood loss was minimal. Impression:               - Non-bleeding external and internal hemorrhoids.                           - Diverticulosis in the sigmoid colon and in the                            distal descending colon.                           - One 2 mm polyp in the sigmoid colon, removed with                            a cold snare. Resected and retrieved.                           - One 1 mm polyp in the ascending colon, removed                            with a cold snare. Resected and retrieved.                           -  One 3 mm polyp in the ascending colon, removed                            with a cold snare. Resected and retrieved.                           - The examination was otherwise normal on  direct                            and retroflexion views.                           - No rectal abnormalities other than hemorrhoid                            seen to explain the abnormal CT scan. Recommendation:           - Patient has a contact number available for                            emergencies. The signs and symptoms of potential                            delayed complications were discussed with the                            patient. Return to normal activities tomorrow.                            Written discharge instructions were provided to the                            patient.                           - Follow a high fiber diet. Drink at least 64                            ounces of water daily. Add a daily stool bulking                            agent such as psyllium (an exampled would be                            Metamucil).                           - Continue present medications.                           - Resume Xarelto (rivaroxaban) at prior dose                            tomorrow.                           -  Await pathology results.                           - Repeat colonoscopy date to be determined after                            pending pathology results are reviewed for                            surveillance. Consider two day bowel prep with                            future procedures.                           - Emerging evidence supports eating a diet of                            fruits, vegetables, grains, calcium, and yogurt                            while reducing red meat and alcohol may reduce the                            risk of colon cancer. Thornton Park MD, MD 11/18/2020 2:22:30 PM This report has been signed electronically.

## 2020-11-18 NOTE — Progress Notes (Signed)
Called to room to assist during endoscopic procedure.  Patient ID and intended procedure confirmed with present staff. Received instructions for my participation in the procedure from the performing physician.  

## 2020-11-18 NOTE — Patient Instructions (Signed)
YOU HAD AN ENDOSCOPIC PROCEDURE TODAY AT THE [facility]:   Refer to the procedure report that was given to you for any specific questions about what was found during the examination.  If the procedure report does not answer your questions, please call your gastroenterologist to clarify.  If you requested that your care partner not be given the details of your procedure findings, then the procedure report has been included in a sealed envelope for you to review at your convenience later.  YOU SHOULD EXPECT: Some feelings of bloating in the abdomen. Passage of more gas than usual.  Walking can help get rid of the air that was put into your GI tract during the procedure and reduce the bloating. If you had a lower endoscopy (such as a colonoscopy or flexible sigmoidoscopy) you may notice spotting of blood in your stool or on the toilet paper. If you underwent a bowel prep for your procedure, you may not have a normal bowel movement for a few days.  Please Note:  You might notice some irritation and congestion in your nose or some drainage.  This is from the oxygen used during your procedure.  There is no need for concern and it should clear up in a day or so.  SYMPTOMS TO REPORT IMMEDIATELY:   Following lower endoscopy (colonoscopy or flexible sigmoidoscopy):  Excessive amounts of blood in the stool  Significant tenderness or worsening of abdominal pains  Swelling of the abdomen that is new, acute  Fever of 100F or higher   Following upper endoscopy (EGD)  Vomiting of blood or coffee ground material  New chest pain or pain under the shoulder blades  Painful or persistently difficult swallowing  New shortness of breath  Fever of 100F or higher  Black, tarry-looking stools  For urgent or emergent issues, a gastroenterologist can be reached at any hour by calling [phone]. Do not use MyChart messaging for urgent concerns.    DIET:  We do recommend a small meal at first, but  then you may proceed to your regular diet.  Drink plenty of fluids but you should avoid alcoholic beverages for 24 hours.  ACTIVITY:  You should plan to take it easy for the rest of today and you should NOT DRIVE or use heavy machinery until tomorrow (because of the sedation medicines used during the test).    FOLLOW UP: Our staff will call the number listed on your records 48-72 hours following your procedure to check on you and address any questions or concerns that you may have regarding the information given to you following your procedure. If we do not reach you, we will leave a message.  We will attempt to reach you two times.  During this call, we will ask if you have developed any symptoms of COVID 19. If you develop any symptoms (ie: fever, flu-like symptoms, shortness of breath, cough etc.) before then, please call [phone].  If you test positive for Covid 19 in the 2 weeks post procedure, please call and report this information to us.    If any biopsies were taken you will be contacted by phone or by letter within the next 1-3 weeks.  Please call us at [phone] if you have not heard about the biopsies in 3 weeks.    SIGNATURES/CONFIDENTIALITY: You and/or your care partner have signed paperwork which will be entered into your electronic medical record.  These signatures attest to the fact that that the information above on   your After Visit Summary has been reviewed and is understood.  Full responsibility of the confidentiality of this discharge information lies with you and/or your care-partner. 

## 2020-11-18 NOTE — Op Note (Signed)
[facility] Patient Name: Frederick Miles Procedure Date: 11/18/2020 1:21 PM MRN: [id number] Endoscopist: Thornton Park MD, MD Age: 73 Referring MD:  Date of Birth: [birth year] Gender: Male Account #: [id number] Procedure:                Upper GI endoscopy Indications:              Abnormal CT of the GI tract                           CT abd/pelvis with and without contrast 08/26/20:                            diffuse wall thickening in the proximal and mid                            stomach may be related to underdistention. Medicines:                Monitored Anesthesia Care Procedure:                Pre-Anesthesia Assessment:                           - Prior to the procedure, a History and Physical                            was performed, and patient medications and                            allergies were reviewed. The patient's tolerance of                            previous anesthesia was also reviewed. The risks                            and benefits of the procedure and the sedation                            options and risks were discussed with the patient.                            All questions were answered, and informed consent                            was obtained. Prior Anticoagulants: The patient has                            taken Xarelto (rivaroxaban), last dose was 2 days                            prior to procedure. ASA Grade Assessment: III - A                            patient with severe systemic disease. After  reviewing the risks and benefits, the patient was                            deemed in satisfactory condition to undergo the                            procedure.                           After obtaining informed consent, the endoscope was                            passed under direct vision. Throughout the                            procedure, the patient's blood pressure, pulse, and                             oxygen saturations were monitored continuously. The                            Endoscope was introduced through the mouth, and                            advanced to the second part of duodenum. The upper                            GI endoscopy was accomplished without difficulty.                            The patient tolerated the procedure well. Scope In: Scope Out: Findings:                 The examined esophagus was normal.                           Diffuse nodular mucosa was found in the gastric                            fundus and in the gastric body. The gastric folds                            in the fundus and proximal body appear prominent                            but nearly flattened with insufflation. Biopsies                            were taken from the antrum, body, and fundus with a                            cold forceps for histology. Estimated blood loss  was minimal.                           Diffuse mildly erythematous mucosa without active                            bleeding and with no stigmata of bleeding was found                            in the duodenal bulb and in the first portion of                            the duodenum. Biopsies were taken with a cold                            forceps for histology. Estimated blood loss was                            minimal.                           The cardia and gastric fundus were normal on                            retroflexion. Complications:            No immediate complications. Estimated blood loss:                            Minimal. Estimated Blood Loss:     Estimated blood loss was minimal. Impression:               - Normal esophagus.                           - Nodular mucosa in the gastric fundus and in the                            gastric body of unclear clinical significance.                            Biopsied.                           - No gastric mass  seen.                           - Erythematous duodenopathy. Biopsied. Recommendation:           - Patient has a contact number available for                            emergencies. The signs and symptoms of potential                            delayed complications were discussed with the  patient. Return to normal activities tomorrow.                            Written discharge instructions were provided to the                            patient.                           - Resume previous diet.                           - Continue present medications.                           - Await pathology results.                           - Consider EUS if appropriate after reviewing the                            gastric biopsies. Thornton Park MD, MD 11/18/2020 2:15:18 PM This report has been signed electronically.

## 2020-11-22 ENCOUNTER — Telehealth: Payer: Self-pay

## 2020-11-22 NOTE — Telephone Encounter (Signed)
  Follow up Call-  Call back number 11/18/2020  Post procedure Call Back phone  # [phone]  Permission to leave phone message Yes  Some recent data might be hidden     Patient questions:  Do you have a fever, pain , or abdominal swelling? No. Pain Score  0 *  Have you tolerated food without any problems? Yes.    Have you been able to return to your normal activities? Yes.    Do you have any questions about your discharge instructions: Diet   No. Medications  No. Follow up visit  No.  Do you have questions or concerns about your Care? No.  Actions: * If pain score is 4 or above: No action needed, pain <4.  1. Have you developed a fever since your procedure? no  2.   Have you had an respiratory symptoms (SOB or cough) since your procedure? no  3.   Have you tested positive for COVID 19 since your procedure no  4.   Have you had any family members/close contacts diagnosed with the COVID 19 since your procedure?  no   If yes to any of these questions please route to Joylene John, RN and Joella Prince, RN

## 2020-12-02 ENCOUNTER — Telehealth: Payer: Self-pay | Admitting: Gastroenterology

## 2020-12-02 NOTE — Telephone Encounter (Signed)
See result note.  

## 2020-12-26 ENCOUNTER — Telehealth: Payer: Self-pay | Admitting: Nurse Practitioner

## 2020-12-26 ENCOUNTER — Other Ambulatory Visit: Payer: Self-pay

## 2020-12-26 DIAGNOSIS — R9389 Abnormal findings on diagnostic imaging of other specified body structures: Secondary | ICD-10-CM

## 2020-12-26 NOTE — Telephone Encounter (Signed)
Called patient and gave Cottie Banda NP comments on his previous labs done at his PCP. Order in [place] for LFTs  next month and patient agreed to come in for them

## 2020-12-26 NOTE — Telephone Encounter (Signed)
Beth, pls contact the patient. Ok to send a letter, let him know I previously reviewed his labs from his PCP which showed normal LFTs and an elevated T. 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 Provide him with a lab order to check a hepatic panel which can be done any time in the next month. thx

## 2021-03-05 ENCOUNTER — Other Ambulatory Visit: Payer: Self-pay | Admitting: Internal Medicine

## 2021-03-06 ENCOUNTER — Other Ambulatory Visit: Payer: Self-pay

## 2021-03-06 ENCOUNTER — Telehealth: Payer: Self-pay

## 2021-03-06 DIAGNOSIS — R935 Abnormal findings on diagnostic imaging of other abdominal regions, including retroperitoneum: Secondary | ICD-10-CM

## 2021-03-06 NOTE — Telephone Encounter (Signed)
Pt scheduled for upper GI series at [facility] 03/15/21@9 :30am. Pt to arrive there at 9am and be NPO after midnight. Pt scheduled to see Dr. Tarri Glenn 03/21/21@3 :20pm. Left message for pt to call back.

## 2021-03-06 NOTE — Telephone Encounter (Signed)
-----   Message from Algernon Huxley, RN sent at 12/02/2020  9:51 AM EST ----- Regarding: Upper GI series Pt needs upper GI series and OV following

## 2021-03-06 NOTE — Telephone Encounter (Signed)
Pt aware but states appt need to be on a Mon or Friday. Pt given the phone number to call and change the upper gi series. OV moved to 04/17/21@2 :10pm. Pt aware.

## 2021-03-15 ENCOUNTER — Ambulatory Visit (HOSPITAL_COMMUNITY): Payer: Medicare Other

## 2021-03-21 ENCOUNTER — Ambulatory Visit: Payer: Medicare Other | Admitting: Gastroenterology

## 2021-04-10 ENCOUNTER — Other Ambulatory Visit: Payer: Self-pay | Admitting: Gastroenterology

## 2021-04-10 ENCOUNTER — Ambulatory Visit (HOSPITAL_COMMUNITY)
Admission: RE | Admit: 2021-04-10 | Discharge: 2021-04-10 | Disposition: A | Discharge status: Home / Self Care | Payer: Medicare Other | Source: Ambulatory Visit | Attending: Gastroenterology | Admitting: Gastroenterology

## 2021-04-10 ENCOUNTER — Other Ambulatory Visit: Payer: Self-pay

## 2021-04-10 DIAGNOSIS — R935 Abnormal findings on diagnostic imaging of other abdominal regions, including retroperitoneum: Secondary | ICD-10-CM

## 2021-04-17 ENCOUNTER — Other Ambulatory Visit: Payer: Self-pay

## 2021-04-17 ENCOUNTER — Encounter: Payer: Self-pay | Admitting: Gastroenterology

## 2021-04-17 ENCOUNTER — Telehealth: Payer: Self-pay | Admitting: Cardiovascular Disease

## 2021-04-17 ENCOUNTER — Ambulatory Visit: Payer: Medicare Other | Admitting: Gastroenterology

## 2021-04-17 VITALS — BP 124/77 | HR 60 | Ht 70.0 in | Wt 173.0 lb

## 2021-04-17 DIAGNOSIS — R935 Abnormal findings on diagnostic imaging of other abdominal regions, including retroperitoneum: Secondary | ICD-10-CM | POA: Diagnosis not present

## 2021-04-17 DIAGNOSIS — R933 Abnormal findings on diagnostic imaging of other parts of digestive tract: Secondary | ICD-10-CM

## 2021-04-17 MED ORDER — RIVAROXABAN 20 MG PO TABS: 20.0000 mg | ORAL_TABLET | Freq: Every day | ORAL | 1 refills | Status: DC

## 2021-04-17 NOTE — Telephone Encounter (Signed)
*  STAT* If patient is at the pharmacy, call can be transferred to refill team.   1. Which medications need to be refilled? (please list name of each medication and dose if known) XARELTO 20 MG TABS tablet  2. Which pharmacy/location (including street and city if local pharmacy) is medication to be sent to? [id number] - [place], [place] - [address] AT [place]  3. Do they need a 30 day or 90 day supply? [place]

## 2021-04-17 NOTE — Progress Notes (Signed)
Referring Provider: Shon Baton, MD Primary Care Physician:  Shon Baton, MD  Chief complaint: Follow-up of abnormal imaging   IMPRESSION:  Abnormal stomach on CT    -diffuse wall thickening in the proximal and mid stomach 08/26/2020, may be due to underdistention    -Nodular gastric mucosa on EGD 11/21, gastric biopsies showed mild hyperemia    - UGI series 04/10/21 confirms prominent gastric folds Small, sliding hiatal hernia Abnormal CT of the rectum    -nonspecific hyperenhancement in the distal rectum 08/26/2020 History of colon polyps    -Sessile serrated polyp on colonoscopy 11/18/20    -Surveillance colonoscopy recommended in 2026, if clinically appropriate at that time  Discussed endoscopy, pathologic, and radiographic findings with the patient and his wife. The clinical implication of these results is unclear. The findings are subtle, there is no obvious malignancy, and Frederick Miles has no symptoms. But, linitus plastica must be considered. Reviewed with Dr. Ardis Hughs to determine candidacy for EUS. He felt it would be very low yield. We agreed that cross-sectional imaging in 6 months would be the appropriate next step. Could proceed with EUS earlier with the development of any new GI symptoms.   PLAN: - CT abd/pelvis with contrast in 6 months - Patient asked to contact me in the meantime with any new GI symptoms - Office follow-up in 6 months after the CT scan  Please see the "Patient Instructions" section for addition details about the plan.  HPI: Frederick Miles is a 75 y.o. male who returns in follow-up.  He was initially seen in consultation by Carl Best 10/10/2020 after some abnormal findings were identified on a CT scan ordered by urology to evaluate hematuria.  The interval history is obtained through the patient, review of his electronic health record, and his wife who accompanies him to this appointment.  A CT of the abdomen and pelvis with and without  contrast 08/26/2020 showed diffuse wall thickening in the proximal and mid stomach, left colon diverticulosis without diverticulitis and a nonspecific hyperenhancement in the distal rectum.  EGD was performed 11/18/20 and showed nodular mucosa in the gastric fundus and the gastric body.  Biopsies showed mild hyperemia.  Colonoscopy was performed 11/18/2020 but showed no rectal abnormalities.  Follow-up upper GI series 04/10/2021 showed a small sliding hiatal hernia and prominent gastric folds in the gastric fundus and proximal body of the stomach.  There was no mass or ulcer.  He has no history of GI symptoms. He denies any GI symptoms at this time. In fact, he is surprised by these findings given his lack of symptoms.   Endoscopic history: Colonoscopy with Dr. Olevia Perches 2003: 10 mm tubular adenoma Colonoscopy with Dr. Olevia Perches 2006: Normal, no polyps Colonoscopy with Dr. Olevia Perches 06/11/2012: Internal hemorrhoids and diverticulosis, no polyps EGD 11/18/2020: Gastric biopsy showed mild hyperemia.  Normal duodenal biopsies. Colonoscopy 11/18/2020: Nonbleeding external and internal hemorrhoids, left-sided diverticulosis, sessile serrated polyp, 2 hyperplastic polyps  No known family history of colon cancer or polyps. No family history of uterine/endometrial cancer, pancreatic cancer or gastric/stomach cancer.   Past Medical History:  Diagnosis Date  . AAA (abdominal aortic aneurysm) without rupture ([place]) 04/28/2017   Aorta US 1/18: 4.2 cm AAA  . Bladder cancer ([place]) 2006  . Cervicalgia   . Coronary atherosclerosis of native coronary artery    stent x1  . Diabetes mellitus without complication ([place])    typ2  . Esophageal reflux   . Esophageal stricture   . Heart  murmur    slight  . History of cardiovascular stress test    Lexiscan Myoview 7/16: EF 66%, diaphragmatic attenuation, no ischemia, low risk  . History of kidney stones   . Hx of adenomatous colonic polyps   . Hyperlipidemia   .  Hypertension   . Myocardial infarct ([place])    2007  . Unspecified hypothyroidism     Past Surgical History:  Procedure Laterality Date  . COLONOSCOPY    . CORONARY ANGIOPLASTY WITH STENT PLACEMENT  2007  . CYSTOSCOPY/RETROGRADE/URETEROSCOPY/STONE EXTRACTION WITH BASKET  08/08/2012   Procedure: CYSTOSCOPY/RETROGRADE/URETEROSCOPY/STONE EXTRACTION WITH BASKET;  Surgeon: Malka So, MD;  Location: [facility];  Service: Urology;  Laterality: Left;  Left Ureteroscopy with Stone Extraction  . SKIN TAG REMOVAL  07/09/06   right anterior thigh  . TONSILLECTOMY AND ADENOIDECTOMY    . TRANSURETHRAL RESECTION OF BLADDER  07/09/06   tumor 2-5 cm  . URETEROSCOPY WITH HOLMIUM LASER LITHOTRIPSY Left 01/06/2019   Procedure: LEFT URETEROSCOPY left retrograde pylegram basket extraction stone bladder biopsy fulgeration;  Surgeon: Irine Seal, MD;  Location: [facility];  Service: Urology;  Laterality: Left;    Current Outpatient Medications  Medication Sig Dispense Refill  . atorvastatin (LIPITOR) 40 MG tablet Take 40 mg by mouth daily.    . Continuous Blood Gluc Sensor (FREESTYLE LIBRE 2 SENSOR) MISC CHANGE EVERY 14 DAYS TO MONITOR BLOOD GLUCOSE CONTINUOUSLY    . diltiazem (CARDIZEM CD) 120 MG 24 hr capsule TAKE 1 CAPSULE BY MOUTH EVERY DAY 90 capsule 3  . empagliflozin (JARDIANCE) 10 MG TABS tablet Take 10 mg by mouth daily.     . hydrochlorothiazide (HYDRODIURIL) 25 MG tablet Take 1 tablet (25 mg total) by mouth daily. 90 tablet 3  . Insulin Glargine (BASAGLAR KWIKPEN [place]) Inject 20 Units into the skin daily with supper.     . insulin lispro (HUMALOG) 100 UNIT/ML injection Inject 3 Units into the skin 3 (three) times daily with meals.     Marland Kitchen levothyroxine (SYNTHROID, LEVOTHROID) 125 MCG tablet Take 125 mcg by mouth daily before breakfast.    . lisinopril (ZESTRIL) 20 MG tablet TAKE 1 TABLET BY MOUTH EVERY DAY 90 tablet 2  . Magnesium 500 MG CAPS Take 500 mg by mouth daily.    . metFORMIN (GLUCOPHAGE) 500 MG tablet  Take 500 mg by mouth daily.    Marland Kitchen omeprazole (PRILOSEC) 20 MG capsule Take 20 mg by mouth daily.    Alveda Reasons 20 MG TABS tablet TAKE 1 TABLET BY MOUTH DAILY WITH SUPPER (Patient taking differently: Take 20 mg by mouth daily with supper.) 30 tablet 5  . nitroGLYCERIN (NITROSTAT) 0.4 MG SL tablet Place 1 tablet (0.4 mg total) under the tongue every 5 (five) minutes as needed for chest pain. 25 tablet 6   No current facility-administered medications for this visit.    Allergies as of 04/17/2021  . (No Known Allergies)    Family History  Problem Relation Age of Onset  . Heart disease Father   . Heart attack Father   . Heart disease Mother   . Heart attack Paternal Grandfather   . Heart attack Paternal Uncle   . Colon cancer Neg Hx   . Stroke Neg Hx   . Diabetes Neg Hx   . Stomach cancer Neg Hx   . Rectal cancer Neg Hx     Social History   Socioeconomic History  . Marital status: Married    Spouse name: Not on file  .  Number of children: 2  . Years of education: Not on file  . Highest education level: Not on file  Occupational History    Employer: [employer]  Tobacco Use  . Smoking status: Former Smoker    Types: Cigarettes    Quit date: 08/05/1979    Years since quitting: 41.7  . Smokeless tobacco: Never Used  Vaping Use  . Vaping Use: Never used  Substance and Sexual Activity  . Alcohol use: Yes    Alcohol/week: 5.0 standard drinks    Types: 4 Glasses of wine, 1 Cans of beer per week  . Drug use: No  . Sexual activity: Yes  Other Topics Concern  . Not on file  Social History Narrative   Daily caffeine    Social Determinants of Health   Financial Resource Strain: Not on file  Food Insecurity: Not on file  Transportation Needs: Not on file  Physical Activity: Not on file  Stress: Not on file  Social Connections: Not on file  Intimate Partner Violence: Not on file     Physical Exam: General:   Alert,  well-nourished, pleasant and cooperative in  NAD Head:  Normocephalic and atraumatic. Eyes:  Sclera clear, no icterus.   Conjunctiva pink. Abdomen:  Soft, nontender, nondistended, normal bowel sounds, no rebound or guarding. No hepatosplenomegaly.  No Sister Karle Starch nodule.  Neurologic:  Alert and  oriented x4;  grossly nonfocal Skin:  Intact without significant lesions or rashes. Psych:  Alert and cooperative. Normal mood and affect.     Britanni Yarde L. Tarri Glenn, MD, MPH 04/17/2021, 2:10 PM

## 2021-04-17 NOTE — Patient Instructions (Addendum)
It was a pleasure to see you today. Based on our discussion, I am providing you with my recommendations below:  RECOMMENDATION(S):   I would like to discuss your symptoms with Dr. Ardis Hughs to determine if we should proceed with scheduling an EUS. You will receive a call from our office regarding the plan of care.  BMI:  . If you are age 74 or older, your body mass index should be between 23-30. Your Body mass index is 24.82 kg/m. If this is out of the aforementioned range listed, please consider follow up with your Primary Care Provider.  Thank you for trusting me with your gastrointestinal care!    Thornton Park, MD, MPH

## 2021-04-17 NOTE — Telephone Encounter (Signed)
Xarelto 20mg  refill request received. Pt is 75 years old, weight-78.5kg, Crea-1.40 on 08/22/2020, last seen by Dr. Burt Knack on 06/30/2020, Diagnosis-Afib, CrCl-52.75ml/min; Dose is appropriate based on dosing criteria. Will send in refill to requested pharmacy.

## 2021-04-18 ENCOUNTER — Other Ambulatory Visit: Payer: Self-pay

## 2021-04-18 DIAGNOSIS — R935 Abnormal findings on diagnostic imaging of other abdominal regions, including retroperitoneum: Secondary | ICD-10-CM

## 2021-04-18 DIAGNOSIS — K30 Functional dyspepsia: Secondary | ICD-10-CM

## 2021-04-18 DIAGNOSIS — K449 Diaphragmatic hernia without obstruction or gangrene: Secondary | ICD-10-CM

## 2021-04-18 NOTE — Progress Notes (Signed)
ct 

## 2021-04-18 NOTE — Progress Notes (Signed)
Following message sent to Rhys Martini and April Pait:  [facility] Phone: [phone] Fax: [phone]   Patient Name: Frederick Miles DOB: [birth year] MRN #: [id number]  Imaging Ordered: CT A/P - needed in 6 months  Diagnosis: Sliding hiatal hernia, delayed gastric emptying, abnormal CT and abnormal gastric emptying study  Ordering Provider: Dr. Tarri Glenn  Is a Prior Authorization needed? We are in the process of obtaining it now  Is the patient Diabetic? No  Does the patient have Hypertension? No  Does the patient have any implanted devices or hardware? No  Date of last BUN/Creat, if needed? N/A  Patient Weight? 173#  Is the patient able to get on the table? Yes  Has the patient been diagnosed with COVID? No  Is the patient waiting on COVID testing results? No  Thank you for your assistance! [facility]

## 2021-04-19 ENCOUNTER — Telehealth: Payer: Self-pay | Admitting: Gastroenterology

## 2021-04-19 NOTE — Telephone Encounter (Signed)
He is correct. He wasn't supposed to have a CT for 6 months. I'm not sure who scheduled a CT scan for now.

## 2021-04-19 NOTE — Telephone Encounter (Signed)
Below is original message sent to schedulers. Message sent again advising they call pt to reschedule as previously requested.  Following message sent to Rhys Martini and April Pait:   [facility] Phone: [phone] Fax: [phone]    Patient Name: Frederick Miles DOB: June 06, 2046 MRN #: [id number]   Imaging Ordered: CT A/P - needed in 6 months   Diagnosis: Sliding hiatal hernia, delayed gastric emptying, abnormal CT and abnormal gastric emptying study   Ordering Provider: Dr. Tarri Glenn   Is a Prior Authorization needed? We are in the process of obtaining it now   Is the patient Diabetic? No   Does the patient have Hypertension? No   Does the patient have any implanted devices or hardware? No   Date of last BUN/Creat, if needed? N/A   Patient Weight? 173#   Is the patient able to get on the table? Yes   Has the patient been diagnosed with COVID? No   Is the patient waiting on COVID testing results? No   Thank you for your assistance! [facility]

## 2021-04-19 NOTE — Telephone Encounter (Signed)
Patient called in with questions regarding CT scheduled 4/29. Stated he was under the impression no other test would be needed and would be seen in October. Can you give him a call back to discuss the plan with him. Best contact number [phone]

## 2021-04-19 NOTE — Telephone Encounter (Signed)
Routing this message to Dr. Tarri Glenn as she had reached out to pt and his wife, LVM to explain POT.   Dr. Tarri Glenn - pt has returned call with questions about plan of treatment after you and Dr. Ardis Hughs spoke. Does appear he is active on My Chart as well. Uncertain if you wanted to send him a My Chart message as well.

## 2021-04-19 NOTE — Telephone Encounter (Signed)
Called pt and advised the schedulers scheduled CT in error. We requested 6 months from now which would be October. Provided him with their contact #[phone] and advised he call to reschedule. Verbalized acceptance and understanding.

## 2021-04-28 ENCOUNTER — Other Ambulatory Visit (HOSPITAL_COMMUNITY): Payer: Medicare Other

## 2021-06-01 ENCOUNTER — Other Ambulatory Visit: Payer: Self-pay

## 2021-06-01 DIAGNOSIS — I2510 Atherosclerotic heart disease of native coronary artery without angina pectoris: Secondary | ICD-10-CM

## 2021-06-01 DIAGNOSIS — I714 Abdominal aortic aneurysm, without rupture, unspecified: Secondary | ICD-10-CM

## 2021-07-03 ENCOUNTER — Other Ambulatory Visit: Payer: Self-pay | Admitting: Cardiovascular Disease

## 2021-08-02 ENCOUNTER — Other Ambulatory Visit: Payer: Self-pay | Admitting: Cardiovascular Disease

## 2021-08-28 ENCOUNTER — Other Ambulatory Visit: Payer: Self-pay | Admitting: Cardiovascular Disease

## 2021-09-02 ENCOUNTER — Other Ambulatory Visit: Payer: Self-pay | Admitting: Cardiovascular Disease

## 2021-09-05 NOTE — Telephone Encounter (Addendum)
Prescription refill request for Xarelto received.  Indication: Afib  Last office visit: 06/30/2020 (Pt scheduled for office visit with Dr Burt Knack on 02/23/22) Weight: 78.5kg Age: 75 Scr: 1.4 (06/06/21 via PCP)  CrCl: 52.30m/min  Appropriate dose and refill sent to requested pharmacy.

## 2021-09-13 ENCOUNTER — Ambulatory Visit (INDEPENDENT_AMBULATORY_CARE_PROVIDER_SITE_OTHER)
Admission: RE | Admit: 2021-09-13 | Discharge: 2021-09-13 | Disposition: A | Discharge status: Home / Self Care | Payer: Medicare Other | Source: Ambulatory Visit | Attending: Vascular Surgery | Admitting: Vascular Surgery

## 2021-09-13 ENCOUNTER — Other Ambulatory Visit: Payer: Self-pay

## 2021-09-13 ENCOUNTER — Encounter: Payer: Self-pay | Admitting: Vascular Surgery

## 2021-09-13 ENCOUNTER — Ambulatory Visit (HOSPITAL_COMMUNITY)
Admission: RE | Admit: 2021-09-13 | Discharge: 2021-09-13 | Disposition: A | Discharge status: Home / Self Care | Payer: Medicare Other | Source: Ambulatory Visit | Attending: Vascular Surgery | Admitting: Vascular Surgery

## 2021-09-13 ENCOUNTER — Ambulatory Visit: Payer: Medicare Other | Admitting: Vascular Surgery

## 2021-09-13 VITALS — BP 134/70 | HR 59 | Temp 98.3°F | Resp 20 | Ht 70.0 in | Wt 171.0 lb

## 2021-09-13 DIAGNOSIS — I714 Abdominal aortic aneurysm, without rupture, unspecified: Secondary | ICD-10-CM

## 2021-09-13 DIAGNOSIS — I2510 Atherosclerotic heart disease of native coronary artery without angina pectoris: Secondary | ICD-10-CM | POA: Diagnosis present

## 2021-09-13 NOTE — Progress Notes (Signed)
Patient ID: Frederick Miles, male   DOB: 01-08-1946, 75 y.o.   MRN: [id number]  Reason for Consult: Follow-up   Referred by Shon Baton, MD  Subjective:     HPI:  Frederick Miles is a 75 y.o. male history of saccular abdominal aortic aneurysm which has been followed for several years.  He has no new back or abdominal pain.  He continues to be very active bass fishing.  He has no lower extremity symptoms other than occasional numbness of his left great toe when he is sitting around.  He walks without limitation.  No history of stroke, TIA or amaurosis.  His father did have an abdominal aortic aneurysm.  Past Medical History:  Diagnosis Date  . AAA (abdominal aortic aneurysm) without rupture ([place]) 04/28/2017   Aorta US 1/18: 4.2 cm AAA  . Bladder cancer ([place]) 2006  . Cervicalgia   . Coronary atherosclerosis of native coronary artery    stent x1  . Diabetes mellitus without complication ([address])    typ2  . Esophageal reflux   . Esophageal stricture   . Heart murmur    slight  . History of cardiovascular stress test    Lexiscan Myoview 7/16: EF 66%, diaphragmatic attenuation, no ischemia, low risk  . History of kidney stones   . Hx of adenomatous colonic polyps   . Hyperlipidemia   . Hypertension   . Myocardial infarct ([place])    2007  . Unspecified hypothyroidism    Family History  Problem Relation Age of Onset  . Heart disease Father   . Heart attack Father   . Heart disease Mother   . Heart attack Paternal Grandfather   . Heart attack Paternal Uncle   . Colon cancer Neg Hx   . Stroke Neg Hx   . Diabetes Neg Hx   . Stomach cancer Neg Hx   . Rectal cancer Neg Hx    Past Surgical History:  Procedure Laterality Date  . COLONOSCOPY    . CORONARY ANGIOPLASTY WITH STENT PLACEMENT  2007  . CYSTOSCOPY/RETROGRADE/URETEROSCOPY/STONE EXTRACTION WITH BASKET  08/08/2012   Procedure: CYSTOSCOPY/RETROGRADE/URETEROSCOPY/STONE EXTRACTION WITH BASKET;  Surgeon: Malka So, MD;   [facility];  Service: Urology;  Laterality: Left;  Left Ureteroscopy with Stone Extraction  . SKIN TAG REMOVAL  07/09/06   right anterior thigh  . TONSILLECTOMY AND ADENOIDECTOMY    . TRANSURETHRAL RESECTION OF BLADDER  07/09/06   tumor 2-5 cm  . URETEROSCOPY WITH HOLMIUM LASER LITHOTRIPSY Left 01/06/2019   Procedure: LEFT URETEROSCOPY left retrograde pylegram basket extraction stone bladder biopsy fulgeration;  Surgeon: Irine Seal, MD;  [facility];  Service: Urology;  Laterality: Left;    Short Social History:  Social History   Tobacco Use  . Smoking status: Former    Types: Cigarettes    Quit date: 08/05/1979    Years since quitting: 42.1  . Smokeless tobacco: Never  Substance Use Topics  . Alcohol use: Yes    Alcohol/week: 5.0 standard drinks    Types: 4 Glasses of wine, 1 Cans of beer per week    No Known Allergies  Current Outpatient Medications  Medication Sig Dispense Refill  . atorvastatin (LIPITOR) 40 MG tablet Take 40 mg by mouth daily.    . Continuous Blood Gluc Sensor (FREESTYLE LIBRE 2 SENSOR) MISC CHANGE EVERY 14 DAYS TO MONITOR BLOOD GLUCOSE CONTINUOUSLY    . diltiazem (CARDIZEM CD) 120 MG 24 hr capsule TAKE 1 CAPSULE BY MOUTH EVERY DAY  90 capsule 3  . empagliflozin (JARDIANCE) 10 MG TABS tablet Take 10 mg by mouth daily.     . hydrochlorothiazide (HYDRODIURIL) 25 MG tablet TAKE 1 TABLET (25 MG TOTAL) BY MOUTH DAILY. PLEASE KEEP UPCOMING APPT FOR FUTURE REFILLS. 30 tablet 11  . Insulin Glargine (BASAGLAR KWIKPEN [place]) Inject 20 Units into the skin daily with supper.     . insulin lispro (HUMALOG) 100 UNIT/ML injection Inject 3 Units into the skin 3 (three) times daily with meals.     Marland Kitchen levothyroxine (SYNTHROID, LEVOTHROID) 125 MCG tablet Take 125 mcg by mouth daily before breakfast.    . lisinopril (ZESTRIL) 20 MG tablet TAKE 1 TABLET BY MOUTH EVERY DAY 90 tablet 2  . Magnesium 500 MG CAPS Take 500 mg by mouth daily.    . metFORMIN (GLUCOPHAGE) 500 MG tablet  Take 500 mg by mouth daily.    Marland Kitchen omeprazole (PRILOSEC) 20 MG capsule Take 20 mg by mouth daily.    Alveda Reasons 20 MG TABS tablet TAKE 1 TABLET BY MOUTH DAILY WITH SUPPER. 90 tablet 1  . nitroGLYCERIN (NITROSTAT) 0.4 MG SL tablet Place 1 tablet (0.4 mg total) under the tongue every 5 (five) minutes as needed for chest pain. 25 tablet 6   No current facility-administered medications for this visit.    Review of Systems  Constitutional:  Constitutional negative. HENT: HENT negative.  Eyes: Eyes negative.  Respiratory: Respiratory negative.  Cardiovascular: Cardiovascular negative.  GI: Gastrointestinal negative.  Musculoskeletal: Musculoskeletal negative.  Skin: Skin negative.  Neurological: Neurological negative. Hematologic: Hematologic/lymphatic negative.  Psychiatric: Psychiatric negative.       Objective:  Objective   Vitals:   09/13/21 0928  BP: 134/70  Pulse: (!) 59  Resp: 20  Temp: 98.3 F (36.8 C)  SpO2: 98%     Physical Exam HENT:     Head: Normocephalic.     Nose:     Comments: Wearing a mask Eyes:     Pupils: Pupils are equal, round, and reactive to light.  Cardiovascular:     Pulses: Normal pulses.          Radial pulses are 2+ on the right side and 2+ on the left side.       Popliteal pulses are 2+ on the right side and 2+ on the left side.       Posterior tibial pulses are 2+ on the right side and 2+ on the left side.  Pulmonary:     Effort: Pulmonary effort is normal.  Abdominal:     General: Abdomen is flat.     Palpations: Abdomen is soft. There is no mass.  Musculoskeletal:        General: No swelling. Normal range of motion.     Cervical back: Neck supple.  Skin:    General: Skin is warm and dry.     Capillary Refill: Capillary refill takes less than 2 seconds.  Neurological:     General: No focal deficit present.     Mental Status: He is alert.  Psychiatric:        Mood and Affect: Mood normal.        Thought Content: Thought content  normal.        Judgment: Judgment normal.    Data: ABI Findings:  +---------+------------------+-----+---------+--------+  Right    Rt Pressure (mmHg)IndexWaveform Comment   +---------+------------------+-----+---------+--------+  Brachial 128                                       +---------+------------------+-----+---------+--------+  PTA      169               1.32 triphasic          +---------+------------------+-----+---------+--------+  DP       177               1.38 triphasic          +---------+------------------+-----+---------+--------+  Great Toe70                0.55 Abnormal           +---------+------------------+-----+---------+--------+   +---------+------------------+-----+---------+-------+  Left     Lt Pressure (mmHg)IndexWaveform Comment  +---------+------------------+-----+---------+-------+  Brachial 126                                      +---------+------------------+-----+---------+-------+  PTA      163               1.27 triphasic         +---------+------------------+-----+---------+-------+  DP       161               1.26 biphasic          +---------+------------------+-----+---------+-------+  Great Toe75                0.59 Abnormal          +---------+------------------+-----+---------+-------+   +-------+-----------+-----------+------------+------------+  ABI/TBIToday's ABIToday's TBIPrevious ABIPrevious TBI  +-------+-----------+-----------+------------+------------+  Right  1.38       0.55                                 +-------+-----------+-----------+------------+------------+  Left   1.27       0.59                                 +-------+-----------+-----------+------------+------------+     Abdominal Aorta Findings:  +-----------+-------+---------+----------+---------+--------+--------------  ----+  Location   AP (cm)Trans    PSV (cm/s)Waveform  ThrombusComments                                (cm)                                                      +-----------+-------+---------+----------+---------+--------+--------------  ----+  Proximal   2.25   2.30     75        biphasic                               +-----------+-------+---------+----------+---------+--------+--------------  ----+  Mid        2.32   2.35     141       biphasic         excessive  bowel  gas                  +-----------+-------+---------+----------+---------+--------+--------------  ----+  Distal     5.10   4.91     137       triphasic                              +-----------+-------+---------+----------+---------+--------+--------------  ----+  RT CIA Prox2.0    1.9      135       biphasic                               +-----------+-------+---------+----------+---------+--------+--------------  ----+  LT CIA Prox1.8    1.9      132       biphasic                               +-----------+-------+---------+----------+---------+--------+--------------  ----+          Summary:  Abdominal Aorta: There is evidence of abnormal dilatation of the distal  Abdominal aorta. The largest aortic diameter remains essentially unchanged  compared to prior exam. Previous diameter measurement was 5.1 cm obtained  on 09/14/2020. 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      Assessment/Plan:     75 year old male with saccular abdominal aortic aneurysm again measuring 5.1 cm and this is stable from our previous duplex.  Looking back to CT scans in the past including at the same time as last years duplex I cannot replicate this size.  I discussed the discrepancy with the patient and I also looked back to scans as far as 2010 with him demonstrating similar size.  We will get him back in 6 months with  repeat CT to truly evaluate the size.  We discussed the signs and symptoms of rupture which I believe to be unlikely but would require urgent medical evaluation.  Patient demonstrates good understanding we will have him back in 6 months     Waynetta Sandy MD [facility]

## 2021-09-27 ENCOUNTER — Other Ambulatory Visit: Payer: Self-pay | Admitting: Cardiovascular Disease

## 2021-10-09 ENCOUNTER — Other Ambulatory Visit: Payer: Self-pay

## 2021-10-09 ENCOUNTER — Ambulatory Visit (HOSPITAL_COMMUNITY)
Admission: RE | Admit: 2021-10-09 | Discharge: 2021-10-09 | Disposition: A | Discharge status: Home / Self Care | Payer: Medicare Other | Source: Ambulatory Visit | Attending: Gastroenterology | Admitting: Gastroenterology

## 2021-10-09 ENCOUNTER — Encounter (HOSPITAL_COMMUNITY): Payer: Self-pay

## 2021-10-09 ENCOUNTER — Telehealth: Payer: Self-pay | Admitting: Gastroenterology

## 2021-10-09 DIAGNOSIS — R935 Abnormal findings on diagnostic imaging of other abdominal regions, including retroperitoneum: Secondary | ICD-10-CM | POA: Diagnosis present

## 2021-10-09 DIAGNOSIS — K449 Diaphragmatic hernia without obstruction or gangrene: Secondary | ICD-10-CM | POA: Insufficient documentation

## 2021-10-09 DIAGNOSIS — K30 Functional dyspepsia: Secondary | ICD-10-CM | POA: Insufficient documentation

## 2021-10-09 LAB — POCT I-STAT CREATININE: Creatinine, Ser: 1.6 mg/dL — ABNORMAL HIGH (ref 0.61–1.24)

## 2021-10-09 MED ORDER — SODIUM CHLORIDE 0.9 % IV SOLN
INTRAVENOUS | Status: AC
  Filled 2021-10-09: qty 250

## 2021-10-09 MED ORDER — IOHEXOL 350 MG/ML SOLN
100.0000 mL | Freq: Once | INTRAVENOUS | Status: AC | PRN
  Administered 2021-10-09: 80 mL via INTRAVENOUS

## 2021-10-09 NOTE — Telephone Encounter (Signed)
Patients wife calling requesting to speak with a nurse regarding CT appointment.

## 2021-10-09 NOTE — Telephone Encounter (Signed)
After speaking with [facility] Sched[facility], pt WAS in fact made aware to come in and pick up contrast as noted on the notes of the appt. No further action would have been required from [employer] or from our office. Refer below:  Notes: PT To ARRIVE @ 745am [facility] Location/ Spk w PT / NPO/ PT To pick up prep  Arrival Time: 8:00 AM    Rescheduled: Checked In: 04/25/2021 11:15 AM 10/09/2021 8:00 AM By: By: Marolyn Haller, JESSICA M

## 2021-10-09 NOTE — Telephone Encounter (Signed)
Returned wife's call. States they were not aware he needed contrast and when he arrived for the CT, had not fully prepared for CT. Educated wife that at the time CT was scheduled by [employer], the scheduler should have informed the pt to stop by Radiology to pick up the contrast in order to prepare for the CT properly. Apologized for the miscommunication and advised that I will remind [facility] to inform pt about this process. Verbalized acceptance and understanding.

## 2021-10-09 NOTE — Telephone Encounter (Signed)
Inbound call from [place] at [facility] requesting a call back stating that she needs a call back regarding this pt and the CT exam that they are currently at. Her best contact information is [phone]. Thank you.

## 2021-10-10 ENCOUNTER — Telehealth: Payer: Self-pay

## 2021-10-10 NOTE — Telephone Encounter (Signed)
Chart Review Routing History Since 10/11/2020 [facility] Full Routing History)  Recipients Sent On Sent By Routed Reports   Shon Baton, MD   10/10/2021  9:12 AM Aleatha Borer, LPN POCT I-STAT CREATININE ([id number])      Cover Page Message : Per Dr. Tarri Glenn request, please review lab results re: possible decline in renal function.

## 2021-10-10 NOTE — Telephone Encounter (Signed)
-----   Message from Thornton Park, MD sent at 10/10/2021  4:45 AM EDT ----- Please schedule an office visit with me - next available - to review the results of the CT (still not available).  However, Mr. Okray labs yesterday show a decline in kidney function. I would ask that he reach out to Dr. Virgina Jock to determine if additional evaluation and/or testing is necessary.  Thanks.  KLB ----- Message ----- From: Buel Ream, Lab In [place] Sent: 10/09/2021  11:04 AM EDT To: Thornton Park, MD

## 2021-10-10 NOTE — Telephone Encounter (Signed)
Per Dr. Tarri Glenn request, pt called to schedule f/u appt to discuss results of CT (not yet available). Pt scheduled as follows:  Appointment Information  Name: Axyl, Sitzman MRN: [id number]  Date: 10/17/2021 Status: Sch  Time: 9:10 AM Length: 20  Visit Type: FOLLOW UP 20 [336] Copay: $30.00  Provider: Thornton Park, MD Department: [facility]  Referring Provider: Shon Baton CSN: [id number]  Notes: Discuss results of CT/ae  Made On: 10/10/2021 9:05 AM By: Hardie Pulley, Xaivier Malay J   Lab results routed to Dr. Virgina Jock for him to further address with pt.

## 2021-10-17 ENCOUNTER — Other Ambulatory Visit: Payer: Self-pay

## 2021-10-17 ENCOUNTER — Ambulatory Visit: Payer: Medicare Other | Admitting: Gastroenterology

## 2021-10-17 ENCOUNTER — Telehealth: Payer: Self-pay

## 2021-10-17 ENCOUNTER — Encounter: Payer: Self-pay | Admitting: Gastroenterology

## 2021-10-17 VITALS — BP 128/78 | HR 64 | Ht 70.0 in | Wt 177.4 lb

## 2021-10-17 DIAGNOSIS — I480 Paroxysmal atrial fibrillation: Secondary | ICD-10-CM

## 2021-10-17 DIAGNOSIS — R935 Abnormal findings on diagnostic imaging of other abdominal regions, including retroperitoneum: Secondary | ICD-10-CM

## 2021-10-17 DIAGNOSIS — R933 Abnormal findings on diagnostic imaging of other parts of digestive tract: Secondary | ICD-10-CM

## 2021-10-17 DIAGNOSIS — K3189 Other diseases of stomach and duodenum: Secondary | ICD-10-CM

## 2021-10-17 NOTE — Telephone Encounter (Addendum)
Per Pharm-D today Jeanine Luz. Pt is ok to have procedure. Though would like to see if we could have pt come in before procedure to be sure levels are good for his blood thinner and that he is still on the correct dose of Xarelto.   I will send an FYI to the requesting office as to the plan of care.

## 2021-10-17 NOTE — Progress Notes (Signed)
Referring Provider: Shon Baton, MD Primary Care Physician:  Frederick Baton, MD  Chief complaint: Follow-up of abnormal imaging   IMPRESSION:  Abnormal stomach on CT    - diffuse wall thickening in the proximal and mid stomach on CT 08/26/2020, may be due to underdistention    - Nodular gastric mucosa on EGD 11/21, gastric biopsies showed mild hyperemia    - UGI series 04/10/21 confirms prominent gastric folds    - stable diffuse wall thickening involving proximal and mid portions of the stomach    - no family history of gastric cancer    - no associated symptoms Small, sliding hiatal hernia Abnormal CT of the rectum    -nonspecific hyperenhancement in the distal rectum 08/26/2020 History of colon polyps    -Sessile serrated polyp on colonoscopy 11/18/20    -Surveillance colonoscopy recommended in 2026, if clinically appropriate at that time  Pretty impressive images on CT. However, there are no significant symptoms and his relatively recent endoscopy showed no obvious malignancy although gastric mucosa was nodular.  Although endoscopic ultrasound is probably low yield, I think it will be helpful to exclude an early process that may only present symptomatically later.  There can be value in a negative exam as well. Endoscopic ultrasound recommended for clarity.   PLAN: - EGD with endoscopic ultrasound with Dr. Ardis Miles  Please see the "Patient Instructions" section for addition details about the plan.  HPI: Frederick Miles is a 75 y.o. male who returns in follow-up. His wife accompanies him to this appointment.  He was initially seen in consultation by Frederick Miles 10/10/2020 after some abnormal findings were identified on a CT scan ordered by urology to evaluate hematuria.    A CT of the abdomen and pelvis with and without contrast 08/26/2020 showed diffuse wall thickening in the proximal and mid stomach, left colon diverticulosis without diverticulitis and a nonspecific  hyperenhancement in the distal rectum.  EGD was performed 11/18/20 and showed nodular mucosa in the gastric fundus and the gastric body.  Biopsies showed mild hyperemia.  Colonoscopy was performed 11/18/2020 but showed no rectal abnormalities.    Follow-up upper GI series 04/10/2021 showed a small sliding hiatal hernia and prominent gastric folds in the gastric fundus and proximal body of the stomach.  There was no mass or ulcer.  Seen in the office 04/17/21. Due to lack of symptoms, we agreed to plan follow-up cross-sectional imaging in 6 months.   Follow-up CT abd.pelvis with contrast 10/09/21: bilateral nonobstructive nephrolithiasis, stable diffuse wall thickening involving proximal and mis portions of the stomach. 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   Returns in follow-up to discuss these results. He has no ongoing GI symptoms except for intermittent constipation. He remains surprised and even confused about the radiographic findings given his lack of associated symptoms.   Endoscopic history: Colonoscopy with Dr. Olevia Miles 2003: 10 mm tubular adenoma Colonoscopy with Dr. Olevia Miles 2006: Normal, no polyps Colonoscopy with Dr. Olevia Miles 06/11/2012: Internal hemorrhoids and diverticulosis, no polyps EGD 11/18/2020: nodular mucosa in the gastric fundus and the gastric body. Gastric biopsy showed mild hyperemia.  Normal duodenal biopsies. Colonoscopy 11/18/2020: Nonbleeding external and internal hemorrhoids, left-sided diverticulosis, sessile serrated polyp, 2 hyperplastic polyps  No known family history of colon cancer or polyps. No family history of uterine/endometrial cancer, pancreatic cancer or gastric/stomach cancer.   Past Medical History:  Diagnosis Date   AAA (abdominal aortic aneurysm) without rupture  04/28/2017   Aorta US 1/18: 4.2 cm AAA   Bladder cancer ([place]) 2006   Cervicalgia    Coronary atherosclerosis  of native coronary artery    stent x1   Diabetes mellitus without complication ([facility])    typ2   Esophageal reflux    Esophageal stricture    Heart murmur    slight   History of cardiovascular stress test    Lexiscan Myoview 7/16: EF 66%, diaphragmatic attenuation, no ischemia, low risk   History of kidney stones    Hx of adenomatous colonic polyps    Hyperlipidemia    Hypertension    Myocardial infarct [facility])    2007   Unspecified hypothyroidism     Past Surgical History:  Procedure Laterality Date   COLONOSCOPY     CORONARY ANGIOPLASTY WITH STENT PLACEMENT  2007   CYSTOSCOPY/RETROGRADE/URETEROSCOPY/STONE EXTRACTION WITH BASKET  08/08/2012   Procedure: CYSTOSCOPY/RETROGRADE/URETEROSCOPY/STONE EXTRACTION WITH BASKET;  Surgeon: Frederick So, MD;  [facility];  Service: Urology;  Laterality: Left;  Left Ureteroscopy with Stone Extraction   SKIN TAG REMOVAL  07/09/06   right anterior thigh   TONSILLECTOMY AND ADENOIDECTOMY     TRANSURETHRAL RESECTION OF BLADDER  07/09/06   tumor 2-5 cm   URETEROSCOPY WITH HOLMIUM LASER LITHOTRIPSY Left 01/06/2019   Procedure: LEFT URETEROSCOPY left retrograde pylegram basket extraction stone bladder biopsy fulgeration;  Surgeon: Frederick Seal, MD;  [facility];  Service: Urology;  Laterality: Left;    Current Outpatient Medications  Medication Sig Dispense Refill   atorvastatin (LIPITOR) 40 MG tablet Take 40 mg by mouth daily.     Continuous Blood Gluc Sensor (FREESTYLE LIBRE 2 SENSOR) MISC CHANGE EVERY 14 DAYS TO MONITOR BLOOD GLUCOSE CONTINUOUSLY     diltiazem (CARDIZEM CD) 120 MG 24 hr capsule Take 1 capsule (120 mg total) by mouth daily. Please keep upcoming appt in February 2023 with Dr. Burt Miles before anymore refills. Thank you 90 capsule 1   empagliflozin (JARDIANCE) 10 MG TABS tablet Take 10 mg by mouth daily.      hydrochlorothiazide (HYDRODIURIL) 25 MG tablet TAKE 1 TABLET (25 MG TOTAL) BY MOUTH DAILY. PLEASE KEEP UPCOMING APPT FOR FUTURE  REFILLS. 30 tablet 11   Insulin Glargine (BASAGLAR KWIKPEN [place]) Inject 20 Units into the skin daily with supper.      insulin lispro (HUMALOG) 100 UNIT/ML injection Inject 3 Units into the skin 3 (three) times daily with meals.      levothyroxine (SYNTHROID, LEVOTHROID) 125 MCG tablet Take 125 mcg by mouth daily before breakfast.     lisinopril (ZESTRIL) 20 MG tablet TAKE 1 TABLET BY MOUTH EVERY DAY 90 tablet 2   Magnesium 500 MG CAPS Take 500 mg by mouth daily.     metFORMIN (GLUCOPHAGE) 500 MG tablet Take 500 mg by mouth daily.     omeprazole (PRILOSEC) 20 MG capsule Take 20 mg by mouth daily.     XARELTO 20 MG TABS tablet TAKE 1 TABLET BY MOUTH DAILY WITH SUPPER. 90 tablet 1   nitroGLYCERIN (NITROSTAT) 0.4 MG SL tablet Place 1 tablet (0.4 mg total) under the tongue every 5 (five) minutes as needed for chest pain. 25 tablet 6   No current facility-administered medications for this visit.    Allergies as of 10/17/2021   (No Known Allergies)    Family History  Problem Relation Age of Onset   Heart disease Father    Heart attack Father    Heart disease Mother  Heart attack Paternal Grandfather    Heart attack Paternal Uncle    Colon cancer Neg Hx    Stroke Neg Hx    Diabetes Neg Hx    Stomach cancer Neg Hx    Rectal cancer Neg Hx         Physical Exam: General:   Alert,  well-nourished, pleasant and cooperative in NAD Head:  Normocephalic and atraumatic. Eyes:  Sclera clear, no icterus.   Conjunctiva pink. Abdomen:  Soft, nontender, nondistended, normal bowel sounds, no rebound or guarding. No hepatosplenomegaly.  No Sister Karle Starch nodule.  Neurologic:  Alert and  oriented x4;  grossly nonfocal Skin:  Intact without significant lesions or rashes. Psych:  Alert and cooperative. Normal mood and affect.     Bensen Chadderdon L. Tarri Glenn, MD, MPH 10/17/2021, 9:11 AM

## 2021-10-17 NOTE — Telephone Encounter (Signed)
I s/w the pt and he is agreeable to lab work as being on a blood thinner last CBC 2020 per pharm-D. Lab orders have been placed, appt has been made.

## 2021-10-17 NOTE — Patient Instructions (Addendum)
It was my pleasure to provide care to you today. Based on our discussion, I am providing you with my recommendations below:  RECOMMENDATION(S):   We discussed your CT scan findings. I am thankful that you aren't having any symptoms. However, given the CT images, I recommend an endoscopic ultrasound for further evaluation. I think it is very unlikely that it will be anything serious, but, I do not have a good explanation for what is causing the changes that we have now seen on serial CT scans.   ENDOSCOPY:   You have been scheduled for an EUS. Please follow written instructions given to you at your visit today. Please note - you have been placed on a move up list. Should there be any cancellations, we call to reschedule.  INHALERS:   If you use inhalers (even only as needed), please bring them with you on the day of your procedure.  MEDICATIONS TO HOLD:  We will contact your provider to request permission for you to hold XARELTO. Once we receive a response, you will be contacted by our office. If you do not hear from our office 1 week prior to your scheduled procedure, please contact our office at [phone].    FOLLOW UP:  After your procedure, you will receive a call from my office staff regarding my recommendation for follow up.  BMI:  If you are age 74 or older, your body mass index should be between 23-30. Your Body mass index is 25.45 kg/m. If this is out of the aforementioned range listed, please consider follow up with your Primary Care Provider.  MY CHART:  The [facility] providers would like to encourage you to use [facility] to communicate with providers for non-urgent requests or questions.  Due to long hold times on the telephone, sending your provider a message by [facility] may be a faster and more efficient way to get a response.  Please allow 48 business hours for a response.  Please remember that this is for non-urgent requests.   Thank you for trusting me with your  gastrointestinal care!    Thornton Park, MD, MPH

## 2021-10-17 NOTE — Telephone Encounter (Signed)
Patient with diagnosis of afib on Xarelto for anticoagulation.    Procedure: EUS Date of procedure: 12/07/21  CHA2DS2-VASc Score = 4  This indicates a 4.8% annual risk of stroke. The patient's score is based upon: CHF History: 0 HTN History: 1 Diabetes History: 1 Stroke History: 0 Vascular Disease History: 1 Age Score: 1 Gender Score: 0   CrCl 46.12 mL/min   Per office protocol, patient can hold Xarelto for 2 days prior to procedure. Scr has fluctuated. Recommended rechecking BMET to confirm accuracy of Xarelto dosing as well as CBC for annual DOAC monitoring (not checked since 2020).

## 2021-10-17 NOTE — Telephone Encounter (Signed)
Clinical pharmacist to review Xarelto 

## 2021-10-17 NOTE — Telephone Encounter (Signed)
See recommendation by our clinical pharmacist, please instruct the patient to hold Xarelto for 2 days.   Also our clinical pharmacist recommended repeat BMET and CBC for annual DOAC monitoring, this should be done prior to his follow up with Dr. Burt Knack in January.

## 2021-10-17 NOTE — Telephone Encounter (Signed)
[facility] Pre-operative Risk Assessment     Frederick Miles [birth year] [id number]  Procedure: EUS Anesthesia type:  MAC Procedure Date: 12/07/21 Provider: Dr. Ardis Hughs  Type of Clearance needed: Pharmacy  Medication(s) needing held: Xarelto   Length of time for medication to be held: 1-2 days  Please review request and advise by either responding to this message or by sending your response to the fax # provided below.  Thank you,  [facility]  Phone: [phone] Fax: [phone] ATTENTION: Melony Tenpas, LPN

## 2021-10-22 ENCOUNTER — Encounter: Payer: Self-pay | Admitting: Gastroenterology

## 2021-10-23 ENCOUNTER — Other Ambulatory Visit: Payer: Self-pay

## 2021-10-23 ENCOUNTER — Other Ambulatory Visit: Payer: Medicare Other | Admitting: *Deleted

## 2021-10-23 DIAGNOSIS — I480 Paroxysmal atrial fibrillation: Secondary | ICD-10-CM

## 2021-10-23 NOTE — Telephone Encounter (Signed)
Received message that labs were ordered incorrectly (under [facility], not [facility]). They have been reentered.

## 2021-10-23 NOTE — Addendum Note (Signed)
Addended by: Herman Mell E on: 10/23/2021 09:09 AM   Modules accepted: Orders

## 2021-10-24 LAB — CBC
Hematocrit: 46.6 % (ref 37.5–51.0)
Hemoglobin: 16.2 g/dL (ref 13.0–17.7)
MCH: 34.1 pg — ABNORMAL HIGH (ref 26.6–33.0)
MCHC: 34.8 g/dL (ref 31.5–35.7)
MCV: 98 fL — ABNORMAL HIGH (ref 79–97)
Platelets: 202 10*3/uL (ref 150–450)
RBC: 4.75 x10E6/uL (ref 4.14–5.80)
RDW: 12 % (ref 11.6–15.4)
WBC: 6.4 10*3/uL (ref 3.4–10.8)

## 2021-10-24 LAB — BASIC METABOLIC PANEL
BUN/Creatinine Ratio: 16 (ref 10–24)
BUN: 24 mg/dL (ref 8–27)
CO2: 21 mmol/L (ref 20–29)
Calcium: 9.6 mg/dL (ref 8.6–10.2)
Chloride: 102 mmol/L (ref 96–106)
Creatinine, Ser: 1.5 mg/dL — ABNORMAL HIGH (ref 0.76–1.27)
Glucose: 154 mg/dL — ABNORMAL HIGH (ref 70–99)
Potassium: 4.4 mmol/L (ref 3.5–5.2)
Sodium: 139 mmol/L (ref 134–144)
eGFR: 49 mL/min/{1.73_m2} — ABNORMAL LOW (ref >59)

## 2021-10-24 NOTE — Telephone Encounter (Signed)
Updated CBC and BMET checked for annual DOAC monitoring on Xarelto. SCr 1.5, CrCl 10mL/min, technically qualifies for dose reduction of Xarelto but since he's so close to the cutoff, will continue at 20mg  daily for now and if future labs trend in same direction with CrCl < 50, then can decrease Xarelto dose. Pt aware of lab results.

## 2021-10-25 NOTE — Telephone Encounter (Signed)
I s/w the pt today and he is agreeable to plan of care for appt for pre op clearance, see notes from pre op app. Pt would like to still keep his appt 12/2021 with Dr. Burt Knack, I assured the pt that is fine and we will not cancel that appt 12/2021 with Dr. Burt Knack. I will forward notes to [facility] for upcoming appt. I will send FYI to requesting office pt has appt 11/14/21.

## 2021-10-25 NOTE — Telephone Encounter (Signed)
   Name: Frederick Miles  DOB: Feb 26, 1946  MRN: [id number]  Primary Cardiologist: Sherren Mocha, MD  Julaine Hua with our callback team asked me to review chart to determine of clearance was complete. Because of GAREY ALLEVA past medical history and time since last visit, the patient will actually require a follow-up visit in order to better assess preoperative cardiovascular risk prior to finalizing clearance. He has not been seen by our practice in over a year, last OV 06/2020.  Pre-op covering staff: - Please schedule appointment and call patient to inform them. Pt had appt in January with Dr. Burt Knack but please move up so that this can occur with advance notice of procedure in December 2022. - I will route this msg to Ammie Eversole with GI team so she is aware.  At time of OV, can reference pharmD notes below regarding anticoagulation.  Charlie Pitter, PA-C  10/25/2021, 9:17 AM

## 2021-10-30 ENCOUNTER — Ambulatory Visit: Payer: Medicare Other | Admitting: Cardiovascular Disease

## 2021-11-07 NOTE — Progress Notes (Signed)
Cardiology Office Note    Date:  11/14/2021   ID:  Frederick Miles, DOB 1946-02-04, MRN [id number]   PCP:  Shon Baton, [place]  Cardiologist:  Sherren Mocha, MD   Advanced Practice Provider:  No care team member to display Electrophysiologist:  None   [phone]   No chief complaint on file.   History of Present Illness:  Frederick Miles is a 75 y.o. 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 Burt Knack 06/30/2020 and was doing well.  Patient is scheduled for upper endoscopy by Dr. [name] 12/07/2021. No chest pain, dyspnea, dizziness or presyncope. Walks 2 1/2 miles 4 days/week.     Past Medical History:  Diagnosis Date   AAA (abdominal aortic aneurysm) without rupture 04/28/2017   Aorta US 1/18: 4.2 cm AAA   Bladder cancer ([place]) 2006   Cervicalgia    Coronary atherosclerosis of native coronary artery    stent x1   Diabetes mellitus without complication (HCC)    typ2   Esophageal reflux    Esophageal stricture    Heart murmur    slight   History of cardiovascular stress test    Lexiscan Myoview 7/16: EF 66%, diaphragmatic attenuation, no ischemia, low risk   History of kidney stones    Hx of adenomatous colonic polyps    Hyperlipidemia    Hypertension    Myocardial infarct [facility])    2007   Unspecified hypothyroidism     Past Surgical History:  Procedure Laterality Date   COLONOSCOPY     CORONARY ANGIOPLASTY WITH STENT PLACEMENT  2007   CYSTOSCOPY/RETROGRADE/URETEROSCOPY/STONE EXTRACTION WITH BASKET  08/08/2012   Procedure: CYSTOSCOPY/RETROGRADE/URETEROSCOPY/STONE EXTRACTION WITH BASKET;  Surgeon: Malka So, MD;  Location: [facility];  Service: Urology;  Laterality: Left;  Left Ureteroscopy with Stone Extraction   SKIN TAG REMOVAL  07/09/06   right anterior thigh    TONSILLECTOMY AND ADENOIDECTOMY     TRANSURETHRAL RESECTION OF BLADDER  07/09/06   tumor 2-5 cm   URETEROSCOPY WITH HOLMIUM LASER LITHOTRIPSY Left 01/06/2019   Procedure: LEFT URETEROSCOPY left retrograde pylegram basket extraction stone bladder biopsy fulgeration;  Surgeon: Irine Seal, MD;  Location: [facility];  Service: Urology;  Laterality: Left;    Current Medications: Current Meds  Medication Sig   apixaban (ELIQUIS) 5 MG TABS tablet Take 1 tablet (5 mg total) by mouth 2 (two) times daily.   atorvastatin (LIPITOR) 40 MG tablet Take 40 mg by mouth daily.   Continuous Blood Gluc Sensor (FREESTYLE LIBRE 2 SENSOR) MISC CHANGE EVERY 14 DAYS TO MONITOR BLOOD GLUCOSE CONTINUOUSLY   diltiazem (CARDIZEM CD) 120 MG 24 hr capsule Take 1 capsule (120 mg total) by mouth daily. Please keep upcoming appt in February 2023 with Dr. Burt Knack before anymore refills. Thank you   empagliflozin (JARDIANCE) 10 MG TABS tablet Take 10 mg by mouth daily.    hydrochlorothiazide (HYDRODIURIL) 25 MG tablet TAKE 1 TABLET (25 MG TOTAL) BY MOUTH DAILY. PLEASE KEEP UPCOMING APPT FOR FUTURE REFILLS.   Insulin Glargine (BASAGLAR KWIKPEN [place]) Inject 20 Units into the skin daily with supper.    insulin lispro (HUMALOG) 100 UNIT/ML injection Inject 3 Units into the skin 3 (three) times daily with meals.    levothyroxine (SYNTHROID, LEVOTHROID) 125 MCG tablet Take 125 mcg by  mouth daily before breakfast.   lisinopril (ZESTRIL) 20 MG tablet TAKE 1 TABLET BY MOUTH EVERY DAY   Magnesium 500 MG CAPS Take 500 mg by mouth daily.   metFORMIN (GLUCOPHAGE) 500 MG tablet Take 500 mg by mouth daily.   omeprazole (PRILOSEC) 20 MG capsule Take 20 mg by mouth daily.   [DISCONTINUED] XARELTO 20 MG TABS tablet TAKE 1 TABLET BY MOUTH DAILY WITH SUPPER.     Allergies:   Patient has no known allergies.   Social History   Socioeconomic History   Marital status: Married    Spouse name: Not on file   Number of children: 2   Years of education:  Not on file   Highest education level: Not on file  Occupational History    Employer: [employer]  Tobacco Use   Smoking status: Former    Types: Cigarettes    Quit date: 08/05/1979    Years since quitting: 42.3   Smokeless tobacco: Never  Vaping Use   Vaping Use: Never used  Substance and Sexual Activity   Alcohol use: Yes    Alcohol/week: 5.0 standard drinks    Types: 4 Glasses of wine, 1 Cans of beer per week   Drug use: No   Sexual activity: Yes  Other Topics Concern   Not on file  Social History Narrative   Daily caffeine    Social Determinants of Health   Financial Resource Strain: Not on file  Food Insecurity: Not on file  Transportation Needs: Not on file  Physical Activity: Not on file  Stress: Not on file  Social Connections: Not on file     Family History:  The patient's  family history includes Heart attack in his father, paternal grandfather, and paternal uncle; Heart disease in his father and mother.   ROS:   Please see the history of present illness.    ROS All other systems reviewed and are negative.   PHYSICAL EXAM:   VS:  BP 124/90   Pulse (!) 53   Ht 5\' 10"  (1.778 m)   Wt 174 lb (78.9 kg)   SpO2 99%   BMI 24.97 kg/m   Physical Exam  GEN: Well nourished, well developed, in no acute distress  HEENT: normal  Neck: no JVD, carotid bruits, or masses Cardiac:RRR; no murmurs, rubs, or gallops  Respiratory:  clear to auscultation bilaterally, normal work of breathing GI: soft, nontender, nondistended, + BS Ext: without cyanosis, clubbing, or edema, Good distal pulses bilaterally MS: no deformity or atrophy  Skin: warm and dry, no rash Neuro:  Alert and Oriented x 3, Strength and sensation are intact Psych: euthymic mood, full affect  Wt Readings from Last 3 Encounters:  11/14/21 174 lb (78.9 kg)  10/17/21 177 lb 6 oz (80.5 kg)  09/13/21 171 lb (77.6 kg)      Studies/Labs Reviewed:   EKG:  EKG is  ordered today.  The ekg  ordered today demonstrates NSR normal EKG  Recent Labs: 10/23/2021: BUN 24; Creatinine, Ser 1.50; Hemoglobin 16.2; Platelets 202; Potassium 4.4; Sodium 139   Lipid Panel    Component Value Date/Time   CHOL 123 03/03/2008 0948   TRIG 150 (H) 03/03/2008 0948   HDL 27.6 (L) 03/03/2008 0948   CHOLHDL 4.5 CALC 03/03/2008 0948   VLDL 30 03/03/2008 0948   LDLCALC 65 03/03/2008 0948    Additional studies/ records that were reviewed today include:  Echo  06/09/20: IMPRESSIONS     1. Left ventricular  ejection fraction, by estimation, is 60 to 65%. The  left ventricle has normal function. The left ventricle has no regional  wall motion abnormalities. Left ventricular diastolic parameters are  indeterminate.   2. Right ventricular systolic function is normal. The right ventricular  size is normal. There is normal pulmonary artery systolic pressure.   3. The mitral valve is normal in structure. No evidence of mitral valve  regurgitation. No evidence of mitral stenosis.   4. The aortic valve is normal in structure. Aortic valve regurgitation is  not visualized. No aortic stenosis is present.   5. Aortic dilatation noted. 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   Abdominal aortia ultrasound 09/13/2021 Summary:  Abdominal Aorta: There is evidence of abnormal dilatation of the distal  Abdominal aorta. The largest aortic diameter remains essentially unchanged  compared to prior exam. Previous diameter measurement was 5.1 cm obtained  on 09/14/2020. 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    *See table(s) above for measurements and observations.     Risk Assessment/Calculations:    CHA2DS2-VASc Score = 4   This indicates a 4.8% annual risk of stroke. The patient's score is based upon: CHF History: 0 HTN History: 1 Diabetes History: 1 Stroke History: 0 Vascular Disease History: 1 Age Score: 1 Gender Score:  0        ASSESSMENT:    1. Preoperative clearance   2. Coronary artery disease involving native coronary artery of native heart without angina pectoris   3. Paroxysmal atrial fibrillation (HCC)   4. Essential hypertension   5. Other hyperlipidemia   6. Abdominal aortic aneurysm (AAA) without rupture, unspecified part   7. Ascending aorta dilatation (HCC)      PLAN:  In order of problems listed above:  Preoperative clearance for endoscopy by Dr. [name] scheduled 12/07/2021.  Patient with CAD, PAF asymptomatic.  He has been on Xarelto but creatinine clearance is 48 so pharmacy recommended switching to Eliquis 5 mg twice daily which he is willing to do.  He should hold this 2 days prior to endoscopy.  METs are close to 9 and low risk procedure.  No further cardiac work-up needed. According to the Revised Cardiac Risk Index (RCRI), his Perioperative Risk of Major Cardiac Event is (%): 0.9  His Functional Capacity in METs is: 8.97 according to the [facility] Activity Status Index (DASI).   CAD status post stent in 2007 on aspirin and atorvastatin-no chest pain, on lipitor, no ASA on Eliquis  PAF on Xarelto but creatinine clearance is 48 so he would need to be on reduced dose of Xarelto.  I spoke with pharmacist and they recommend switching to Eliquis 5 mg twice daily which he is agreeable to do.  Hold 2 days prior to endoscopy.  Recheck renal function in 1 month.  Hypertension BP up a little today but overall has been well controlled  Hyperlipidemia LDL 44 06/06/21 on lipitor  Abdominal aortic aneurysm 5.1 cm 08/2021 followed by [facility]  Dilated ascending aorta 40 mm on echo 06/2020   Shared Decision Making/Informed Consent        Medication Adjustments/Labs and Tests Ordered: Current medicines are reviewed at length with the patient today.  Concerns regarding medicines are outlined above.  Medication changes, Labs and Tests ordered today are listed in the Patient Instructions  below. Patient Instructions  Medication Instructions:  Your physician has recommended you make the following change in your medication:  STOP XARELTO   START ELIQUIS 5 MG TWICE DAILY. YOU HAVE BEEN PROVIDER WITH SAMPLES BY OUR OFFICE AND A 30 DAY FREE CARD. *If you need a refill on your cardiac medications before your next appointment, please call your pharmacy*   Lab Work: TO BE DONE IN 1 MONTH: BMET If you have labs (blood work) drawn today and your tests are completely normal, you will receive your results only by: [place] (if you have MyChart) OR A paper copy in the mail If you have any lab test that is abnormal or we need to change your treatment, we will call you to review the results.   Testing/Procedures: NONE   Follow-Up: At [facility], you and your health needs are our priority.  As part of our continuing mission to provide you with exceptional heart care, we have created designated Provider Care Teams.  These Care Teams include your primary Cardiologist (physician) and Advanced Practice Providers (APPs -  Physician Assistants and Nurse Practitioners) who all work together to provide you with the care you need, when you need it.  We recommend signing up for the patient portal called "MyChart".  Sign up information is provided on this After Visit Summary.  MyChart is used to connect with patients for Virtual Visits (Telemedicine).  Patients are able to view lab/test results, encounter notes, upcoming appointments, etc.  Non-urgent messages can be sent to your provider as well.   To learn more about what you can do with MyChart, go to [email or web address].    Your next appointment:   6 MONTH  The format for your next appointment:   In Person  Provider:   Sherren Mocha, MD {    Other Instructions   One of your tests has shown an aneurysm of your abdominal aorta . The word "aneurysm" refers to a bulge in an artery (blood vessel). Most people think of  them in the context of an emergency, but yours was found incidentally. At this point there is nothing you need to do from a procedure standpoint, but there are some important things to keep in mind for day-to-day life.  Mainstays of therapy for aneurysms include very good blood pressure control, healthy lifestyle, and avoiding tobacco products and street drugs. Research has raised concern that antibiotics in the fluoroquinolone class could be associated with increased risk of having an aneurysm develop or tear. This includes medicines that end in "floxacin," like Cipro or Levaquin. Make sure to discuss this information with other healthcare providers if you require antibiotics.  Since aneurysms can run in families, you should discuss your diagnosis with first degree relatives as they may need to be screened for this. Regular mild-moderate physical exercise is important, but avoid heavy lifting/weight lifting over 30lbs, chopping wood, shoveling snow or digging heavy earth with a shovel. It is best to avoid activities that cause grunting or straining (medically referred to as a "Valsalva maneuver"). This happens when a person bears down against a closed throat to increase the strength of arm or abdominal muscles. There's often a tendency to do this when lifting heavy weights, doing sit-ups, push-ups or chin-ups, etc., but it may be harmful.  This is a finding I would expect to be monitored periodically by your cardiology team. Most unruptured thoracic aortic aneurysms cause no symptoms, so they are often found during exams for other conditions. Contact a health care provider if you develop any discomfort in your upper back, neck, abdomen, trouble swallowing, cough or hoarseness, or unexplained  weight loss. Get help right away if you develop severe pain in your upper back or abdomen that may move into your chest and arms, or any other concerning symptoms such as shortness of breath or fever.   Signed, Ermalinda Barrios, PA-C  11/14/2021 1:20 PM    [facility] [place], [place], [place]  [address] Phone: [phone]; Fax: [phone]

## 2021-11-14 ENCOUNTER — Encounter: Payer: Self-pay | Admitting: Physician Assistant

## 2021-11-14 ENCOUNTER — Other Ambulatory Visit: Payer: Self-pay

## 2021-11-14 ENCOUNTER — Ambulatory Visit: Payer: Medicare Other | Admitting: Physician Assistant

## 2021-11-14 VITALS — BP 124/90 | HR 53 | Ht 70.0 in | Wt 174.0 lb

## 2021-11-14 DIAGNOSIS — Z01810 Encounter for preprocedural cardiovascular examination: Secondary | ICD-10-CM | POA: Diagnosis not present

## 2021-11-14 DIAGNOSIS — I714 Abdominal aortic aneurysm, without rupture, unspecified: Secondary | ICD-10-CM | POA: Diagnosis not present

## 2021-11-14 DIAGNOSIS — Z01818 Encounter for other preprocedural examination: Secondary | ICD-10-CM

## 2021-11-14 DIAGNOSIS — I2510 Atherosclerotic heart disease of native coronary artery without angina pectoris: Secondary | ICD-10-CM

## 2021-11-14 DIAGNOSIS — I480 Paroxysmal atrial fibrillation: Secondary | ICD-10-CM

## 2021-11-14 DIAGNOSIS — I77810 Thoracic aortic ectasia: Secondary | ICD-10-CM

## 2021-11-14 DIAGNOSIS — I10 Essential (primary) hypertension: Secondary | ICD-10-CM

## 2021-11-14 DIAGNOSIS — E7849 Other hyperlipidemia: Secondary | ICD-10-CM

## 2021-11-14 MED ORDER — APIXABAN 5 MG PO TABS: 5.0000 mg | ORAL_TABLET | Freq: Two times a day (BID) | ORAL | 11 refills | Status: DC

## 2021-11-14 NOTE — Patient Instructions (Addendum)
Medication Instructions:  Your physician has recommended you make the following change in your medication:  STOP XARELTO   START ELIQUIS 5 MG TWICE DAILY. YOU HAVE BEEN PROVIDER WITH SAMPLES BY OUR OFFICE AND A 30 DAY FREE CARD. *If you need a refill on your cardiac medications before your next appointment, please call your pharmacy*   Lab Work: TO BE DONE IN 1 MONTH: BMET If you have labs (blood work) drawn today and your tests are completely normal, you will receive your results only by: [place] (if you have MyChart) OR A paper copy in the mail If you have any lab test that is abnormal or we need to change your treatment, we will call you to review the results.   Testing/Procedures: NONE   Follow-Up: At [facility], you and your health needs are our priority.  As part of our continuing mission to provide you with exceptional heart care, we have created designated Provider Care Teams.  These Care Teams include your primary Cardiologist (physician) and Advanced Practice Providers (APPs -  Physician Assistants and Nurse Practitioners) who all work together to provide you with the care you need, when you need it.  We recommend signing up for the patient portal called "MyChart".  Sign up information is provided on this After Visit Summary.  MyChart is used to connect with patients for Virtual Visits (Telemedicine).  Patients are able to view lab/test results, encounter notes, upcoming appointments, etc.  Non-urgent messages can be sent to your provider as well.   To learn more about what you can do with MyChart, go to [email or web address].    Your next appointment:   6 MONTH  The format for your next appointment:   In Person  Provider:   Sherren Mocha, MD {    Other Instructions   One of your tests has shown an aneurysm of your abdominal aorta . The word "aneurysm" refers to a bulge in an artery (blood vessel). Most people think of them in the context of an  emergency, but yours was found incidentally. At this point there is nothing you need to do from a procedure standpoint, but there are some important things to keep in mind for day-to-day life.  Mainstays of therapy for aneurysms include very good blood pressure control, healthy lifestyle, and avoiding tobacco products and street drugs. Research has raised concern that antibiotics in the fluoroquinolone class could be associated with increased risk of having an aneurysm develop or tear. This includes medicines that end in "floxacin," like Cipro or Levaquin. Make sure to discuss this information with other healthcare providers if you require antibiotics.  Since aneurysms can run in families, you should discuss your diagnosis with first degree relatives as they may need to be screened for this. Regular mild-moderate physical exercise is important, but avoid heavy lifting/weight lifting over 30lbs, chopping wood, shoveling snow or digging heavy earth with a shovel. It is best to avoid activities that cause grunting or straining (medically referred to as a "Valsalva maneuver"). This happens when a person bears down against a closed throat to increase the strength of arm or abdominal muscles. There's often a tendency to do this when lifting heavy weights, doing sit-ups, push-ups or chin-ups, etc., but it may be harmful.  This is a finding I would expect to be monitored periodically by your cardiology team. Most unruptured thoracic aortic aneurysms cause no symptoms, so they are often found during exams for other conditions. Contact a health care provider if you  develop any discomfort in your upper back, neck, abdomen, trouble swallowing, cough or hoarseness, or unexplained weight loss. Get help right away if you develop severe pain in your upper back or abdomen that may move into your chest and arms, or any other concerning symptoms such as shortness of breath or fever.

## 2021-11-20 ENCOUNTER — Telehealth: Payer: Self-pay

## 2021-11-20 NOTE — Telephone Encounter (Signed)
-----   Message from Milus Banister, MD sent at 11/15/2021  8:50 AM EST ----- Got it, thanks  Rylend Pietrzak, can you make sure he undertands the recommendations, needs to stop his eliquis 2 days prior to his 12/8 EUS at [facility].  Thanks   ----- Message ----- From: Imogene Burn, PA-C Sent: 11/14/2021   1:20 PM EST To: Milus Banister, MD  Cardiac clearance for endoscopy.

## 2021-11-20 NOTE — Telephone Encounter (Signed)
The pt has been advised to stop Eliquis on 12/6. He has no further questions or concerns.

## 2021-11-27 ENCOUNTER — Encounter (HOSPITAL_COMMUNITY): Payer: Self-pay | Admitting: Gastroenterology

## 2021-12-07 ENCOUNTER — Encounter (HOSPITAL_COMMUNITY)
Admission: RE | Disposition: A | Discharge status: Home / Self Care | Payer: Self-pay | Source: Ambulatory Visit | Attending: Gastroenterology

## 2021-12-07 ENCOUNTER — Other Ambulatory Visit: Payer: Self-pay

## 2021-12-07 ENCOUNTER — Encounter (HOSPITAL_COMMUNITY): Payer: Self-pay | Admitting: Gastroenterology

## 2021-12-07 ENCOUNTER — Ambulatory Visit (HOSPITAL_COMMUNITY): Payer: Medicare Other | Admitting: Anesthesiology

## 2021-12-07 ENCOUNTER — Ambulatory Visit (HOSPITAL_COMMUNITY)
Admission: RE | Admit: 2021-12-07 | Discharge: 2021-12-07 | Disposition: A | Discharge status: Home / Self Care | Payer: Medicare Other | Source: Ambulatory Visit | Attending: Gastroenterology | Admitting: Gastroenterology

## 2021-12-07 DIAGNOSIS — Z79899 Other long term (current) drug therapy: Secondary | ICD-10-CM | POA: Diagnosis not present

## 2021-12-07 DIAGNOSIS — R591 Generalized enlarged lymph nodes: Secondary | ICD-10-CM | POA: Diagnosis not present

## 2021-12-07 DIAGNOSIS — I252 Old myocardial infarction: Secondary | ICD-10-CM | POA: Insufficient documentation

## 2021-12-07 DIAGNOSIS — K2950 Unspecified chronic gastritis without bleeding: Secondary | ICD-10-CM | POA: Diagnosis not present

## 2021-12-07 DIAGNOSIS — Z955 Presence of coronary angioplasty implant and graft: Secondary | ICD-10-CM | POA: Diagnosis not present

## 2021-12-07 DIAGNOSIS — R933 Abnormal findings on diagnostic imaging of other parts of digestive tract: Secondary | ICD-10-CM | POA: Diagnosis present

## 2021-12-07 DIAGNOSIS — Z87891 Personal history of nicotine dependence: Secondary | ICD-10-CM | POA: Insufficient documentation

## 2021-12-07 DIAGNOSIS — K3189 Other diseases of stomach and duodenum: Secondary | ICD-10-CM | POA: Insufficient documentation

## 2021-12-07 DIAGNOSIS — K317 Polyp of stomach and duodenum: Secondary | ICD-10-CM | POA: Diagnosis not present

## 2021-12-07 DIAGNOSIS — I2510 Atherosclerotic heart disease of native coronary artery without angina pectoris: Secondary | ICD-10-CM | POA: Insufficient documentation

## 2021-12-07 DIAGNOSIS — Z7984 Long term (current) use of oral hypoglycemic drugs: Secondary | ICD-10-CM | POA: Diagnosis not present

## 2021-12-07 DIAGNOSIS — K2970 Gastritis, unspecified, without bleeding: Secondary | ICD-10-CM | POA: Diagnosis not present

## 2021-12-07 DIAGNOSIS — R935 Abnormal findings on diagnostic imaging of other abdominal regions, including retroperitoneum: Secondary | ICD-10-CM

## 2021-12-07 DIAGNOSIS — I10 Essential (primary) hypertension: Secondary | ICD-10-CM | POA: Diagnosis not present

## 2021-12-07 DIAGNOSIS — K219 Gastro-esophageal reflux disease without esophagitis: Secondary | ICD-10-CM | POA: Insufficient documentation

## 2021-12-07 DIAGNOSIS — Z794 Long term (current) use of insulin: Secondary | ICD-10-CM | POA: Insufficient documentation

## 2021-12-07 DIAGNOSIS — E1151 Type 2 diabetes mellitus with diabetic peripheral angiopathy without gangrene: Secondary | ICD-10-CM | POA: Insufficient documentation

## 2021-12-07 HISTORY — PX: ESOPHAGOGASTRODUODENOSCOPY (EGD) WITH PROPOFOL: SHX5813

## 2021-12-07 HISTORY — PX: FINE NEEDLE ASPIRATION: SHX5430

## 2021-12-07 HISTORY — PX: EUS: SHX5427

## 2021-12-07 HISTORY — PX: BIOPSY: SHX5522

## 2021-12-07 LAB — GLUCOSE, CAPILLARY: Glucose-Capillary: 175 mg/dL — ABNORMAL HIGH (ref 70–99)

## 2021-12-07 SURGERY — UPPER ENDOSCOPIC ULTRASOUND (EUS) RADIAL
Anesthesia: Monitor Anesthesia Care

## 2021-12-07 MED ORDER — EPHEDRINE SULFATE-NACL 50-0.9 MG/10ML-% IV SOSY
PREFILLED_SYRINGE | INTRAVENOUS | Status: DC | PRN
  Administered 2021-12-07: 10 mg via INTRAVENOUS

## 2021-12-07 MED ORDER — LIDOCAINE 2% (20 MG/ML) 5 ML SYRINGE
INTRAMUSCULAR | Status: DC | PRN
  Administered 2021-12-07: 80 mg via INTRAVENOUS

## 2021-12-07 MED ORDER — LACTATED RINGERS IV SOLN: INTRAVENOUS | Status: DC

## 2021-12-07 MED ORDER — PROPOFOL 500 MG/50ML IV EMUL
INTRAVENOUS | Status: DC | PRN
  Administered 2021-12-07: 150 ug/kg/min via INTRAVENOUS

## 2021-12-07 MED ORDER — PROPOFOL 10 MG/ML IV BOLUS
INTRAVENOUS | Status: DC | PRN
  Administered 2021-12-07: 60 mg via INTRAVENOUS

## 2021-12-07 MED ORDER — SODIUM CHLORIDE 0.9 % IV SOLN: INTRAVENOUS | Status: DC

## 2021-12-07 MED ORDER — PROPOFOL 500 MG/50ML IV EMUL
INTRAVENOUS | Status: AC
  Filled 2021-12-07: qty 50

## 2021-12-07 NOTE — Op Note (Signed)
[facility] Patient Name: Frederick Miles Procedure Date: 12/07/2021 MRN: [id number] Attending MD: Milus Banister , MD Date of Birth: Apr 13, 1946 CSN: [id number] Age: 75 Admit Type: Outpatient Procedure:                Upper EUS Indications:              Abnormally thickened appearing stomach on recent                            imaging; no GI symptoms and no weight loss Providers:                Milus Banister, MD, Carlyn Reichert, RN, Cherylynn Ridges, Technician, Marla Roe, CRNA Referring MD:             Thornton Park, MD Medicines:                Monitored Anesthesia Care Complications:            No immediate complications. Estimated blood loss:                            None. Estimated Blood Loss:     Estimated blood loss: none. Procedure:                Pre-Anesthesia Assessment:                           - Prior to the procedure, a History and Physical                            was performed, and patient medications and                            allergies were reviewed. The patient's tolerance of                            previous anesthesia was also reviewed. The risks                            and benefits of the procedure and the sedation                            options and risks were discussed with the patient.                            All questions were answered, and informed consent                            was obtained. Prior Anticoagulants: The patient has                            taken Eliquis (apixaban), last dose was 5 days  prior to procedure. ASA Grade Assessment: III - A                            patient with severe systemic disease. After                            reviewing the risks and benefits, the patient was                            deemed in satisfactory condition to undergo the                            procedure.                           After obtaining informed  consent, the endoscope was                            passed under direct vision. Throughout the                            procedure, the patient's blood pressure, pulse, and                            oxygen saturations were monitored continuously. The                            GF-UE190-AL5 ([id number]) Olympus radial ultrasound                            scope was introduced through the mouth, and                            advanced to the second part of duodenum. The                            GF-[id number] ([id number]) Olympus linear ultrasound scope                            was introduced through the mouth, and advanced to                            the second part of duodenum. The GIF-H190 ([id number])                            Olympus endoscope was introduced through the mouth,                            and advanced to the second part of duodenum. The                            upper EUS was accomplished without difficulty. The  patient tolerated the procedure well. Scope In: Scope Out: Findings:      ENDOSCOPIC FINDING (adult gastroscope and both echoendoscopes): :      Mild inflammation characterized by erythema was found in the gastric       antrum. Biopsies were taken with a cold forceps for histology.      Gastric ruggae in the proximal half of the stomach with thickened,       edematous appearing. No neoplastic appearing mucosa. Biopsies taken and       sent to pathology      UGI tract was otherwise normal      ENDOSONOGRAPHIC FINDING: :      1. Diffuse wall thickening was visualized endosonographically in the       proximal half of the stomach. This appeared to primarily be due to       thickening within the luminal interface/superficial mucosa (Layer 1),       deep mucosa (Layer 2) and submucosa (Layer 3). The gastric wall measured       up to 12 mm in thickness. The muscularis propria layer was crisp, normal       appearing. Fine needle aspiration for  cytology was performed of the       thickened gastric wall, ruggae. Color Doppler imaging was utilized prior       to needle puncture to confirm a lack of significant vascular structures       within the needle path. Two passes were made with the 22 gauge needle. A       preliminary cytologic examination was not performed. Final cytology       results are pending.      2. Limited views of the pancreas, CBD, liver, spleen were all normal. Impression:               - Non-specific distal gastritis, biopsied.                           - Thickened proximal gastric ruggae, biopsied.                           - Diffuse wall thickening in the proximal half of                            the stomach, confined to mucosa/deep                            mucosa/submucosa layers. This was sampled with EUS                            guided FNA.                           - Await final pathology and cytology results. If                            these show no sign of neoplasm then I think                            following him clinically will be reasonable going  forward. Moderate Sedation:      Not Applicable - Patient had care per Anesthesia. Recommendation:           - Discharge patient to home.                           - Await cytology results and await path results. Procedure Code(s):        --- Professional ---                           [phone], Esophagogastroduodenoscopy, flexible,                            transoral; with transendoscopic ultrasound-guided                            intramural or transmural fine needle                            aspiration/biopsy(s), (includes endoscopic                            ultrasound examination limited to the esophagus,                            stomach or duodenum, and adjacent structures) Diagnosis Code(s):        --- Professional ---                           K29.70, Gastritis, unspecified, without bleeding                            K31.89, Other diseases of stomach and duodenum                           R59.1, Generalized enlarged lymph nodes                           R93.3, Abnormal findings on diagnostic imaging of                            other parts of digestive tract CPT copyright 2019 [employer]. All rights reserved. The codes documented in this report are preliminary and upon coder review may  be revised to meet current compliance requirements. Milus Banister, MD 12/07/2021 9:46:09 AM This report has been signed electronically. Number of Addenda: 0

## 2021-12-07 NOTE — Anesthesia Preprocedure Evaluation (Addendum)
Anesthesia Evaluation  Patient identified by MRN, date of birth, ID band Patient awake    Reviewed: Allergy & Precautions, NPO status , Patient's Chart, lab work & pertinent test results  Airway Mallampati: III  TM Distance: >3 FB Neck ROM: Full    Dental  (+) Teeth Intact, Dental Advisory Given   Pulmonary former smoker,    breath sounds clear to auscultation       Cardiovascular hypertension, Pt. on home beta blockers and Pt. on medications + CAD, + Past MI, + Cardiac Stents and + Peripheral Vascular Disease   Rhythm:Regular Rate:Normal     Neuro/Psych negative neurological ROS  negative psych ROS   GI/Hepatic Neg liver ROS, GERD  Medicated,  Endo/Other  diabetes, Type 2, Insulin Dependent, Oral Hypoglycemic AgentsHypothyroidism   Renal/GU negative Renal ROS     Musculoskeletal negative musculoskeletal ROS (+)   Abdominal Normal abdominal exam  (+)   Peds  Hematology negative hematology ROS (+)   Anesthesia Other Findings   Reproductive/Obstetrics                            Anesthesia Physical Anesthesia Plan  ASA: 3  Anesthesia Plan: MAC   Post-op Pain Management:    Induction: Intravenous  PONV Risk Score and Plan: 0 and Propofol infusion  Airway Management Planned: Natural Airway and Simple Face Mask  Additional Equipment: None  Intra-op Plan:   Post-operative Plan:   Informed Consent: I have reviewed the patients History and Physical, chart, labs and discussed the procedure including the risks, benefits and alternatives for the proposed anesthesia with the patient or authorized representative who has indicated his/her understanding and acceptance.       Plan Discussed with: CRNA  Anesthesia Plan Comments:        Anesthesia Quick Evaluation

## 2021-12-07 NOTE — Anesthesia Postprocedure Evaluation (Signed)
Anesthesia Post Note  Patient: JAHSIAH CARPENTER  Procedure(s) Performed: UPPER ENDOSCOPIC ULTRASOUND (EUS) RADIAL ESOPHAGOGASTRODUODENOSCOPY (EGD) WITH PROPOFOL UPPER ENDOSCOPIC ULTRASOUND (EUS) LINEAR BIOPSY FINE NEEDLE ASPIRATION (FNA) LINEAR     Patient location during evaluation: PACU Anesthesia Type: MAC Level of consciousness: awake and alert Pain management: pain level controlled Vital Signs Assessment: post-procedure vital signs reviewed and stable Respiratory status: spontaneous breathing, nonlabored ventilation, respiratory function stable and patient connected to nasal cannula oxygen Cardiovascular status: stable and blood pressure returned to baseline Postop Assessment: no apparent nausea or vomiting Anesthetic complications: no   No notable events documented.  Last Vitals:  Vitals:   12/07/21 0955 12/07/21 0956  BP:  131/71  Pulse: (!) 59 (!) 55  Resp: 18 16  Temp:    SpO2: 97% 99%    Last Pain:  Vitals:   12/07/21 0931  TempSrc: Axillary  PainSc:                  Effie Berkshire

## 2021-12-07 NOTE — Transfer of Care (Signed)
Immediate Anesthesia Transfer of Care Note  Patient: Frederick Miles  Procedure(s) Performed: UPPER ENDOSCOPIC ULTRASOUND (EUS) RADIAL ESOPHAGOGASTRODUODENOSCOPY (EGD) WITH PROPOFOL UPPER ENDOSCOPIC ULTRASOUND (EUS) LINEAR BIOPSY FINE NEEDLE ASPIRATION (FNA) LINEAR  Patient Location: PACU  Anesthesia Type:MAC  Level of Consciousness: sedated  Airway & Oxygen Therapy: Patient Spontanous Breathing and Patient connected to face mask oxygen  Post-op Assessment: Report given to RN and Post -op Vital signs reviewed and stable  Post vital signs: Reviewed and stable  Last Vitals:  Vitals Value Taken Time  BP 90/47 12/07/21 0926  Temp    Pulse 52 12/07/21 0928  Resp 10 12/07/21 0928  SpO2 100 % 12/07/21 0928  Vitals shown include unvalidated device data.  Last Pain:  Vitals:   12/07/21 0808  TempSrc: Oral  PainSc: 0-No pain         Complications: No notable events documented.

## 2021-12-07 NOTE — H&P (Signed)
HPI: This is a man sent by Dr. Tarri Miles for abnormal stomach. He has no GI symptoms and has not been losing weight   ROS: complete GI ROS as described in HPI, all other review negative.  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   Aorta US 1/18: 4.2 cm AAA   Bladder cancer ([place]) 2006   Cervicalgia    Coronary atherosclerosis of native coronary artery    stent x1   Diabetes mellitus without complication (HCC)    typ2   Esophageal reflux    Esophageal stricture    Heart murmur    slight   History of cardiovascular stress test    Lexiscan Myoview 7/16: EF 66%, diaphragmatic attenuation, no ischemia, low risk   History of kidney stones    Hx of adenomatous colonic polyps    Hyperlipidemia    Hypertension    Myocardial infarct [facility]    2007   Unspecified hypothyroidism     Past Surgical History:  Procedure Laterality Date   COLONOSCOPY     CORONARY ANGIOPLASTY WITH STENT PLACEMENT  2007   CYSTOSCOPY/RETROGRADE/URETEROSCOPY/STONE EXTRACTION WITH BASKET  08/08/2012   Procedure: CYSTOSCOPY/RETROGRADE/URETEROSCOPY/STONE EXTRACTION WITH BASKET;  Surgeon: Frederick So, MD;  Location: [facility];  Service: Urology;  Laterality: Left;  Left Ureteroscopy with Stone Extraction   SKIN TAG REMOVAL  07/09/06   right anterior thigh   TONSILLECTOMY AND ADENOIDECTOMY     TRANSURETHRAL RESECTION OF BLADDER  07/09/06   tumor 2-5 cm   URETEROSCOPY WITH HOLMIUM LASER LITHOTRIPSY Left 01/06/2019   Procedure: LEFT URETEROSCOPY left retrograde pylegram basket extraction stone bladder biopsy fulgeration;  Surgeon: Frederick Seal, MD;  Location: [facility];  Service: Urology;  Laterality: Left;    Current Facility-Administered Medications  Medication Dose Route Frequency Provider Last Rate Last Admin   0.9 %  sodium chloride infusion   Intravenous Continuous Frederick Banister, MD       lactated ringers  infusion   Intravenous Continuous Frederick Banister, MD        Allergies as of 10/17/2021   (No Known Allergies)    Family History  Problem Relation Age of Onset   Heart disease Father    Heart attack Father    Heart disease Mother    Heart attack Paternal Grandfather    Heart attack Paternal Uncle    Colon cancer Neg Hx    Stroke Neg Hx    Diabetes Neg Hx    Stomach cancer Neg Hx    Rectal cancer Neg Hx     Social History   Socioeconomic History   Marital status: Married    Spouse name: Not on file   Number of children: 2   Years of education: Not on file   Highest education level: Not on file  Occupational History    Employer: [employer]  Tobacco Use   Smoking status: Former    Types: Cigarettes    Quit date: 08/05/1979    Years since quitting: 42.3   Smokeless tobacco: Never  Vaping Use   Vaping Use: Never used  Substance and Sexual Activity   Alcohol use: Yes    Alcohol/week: 5.0 standard drinks    Types: 4 Glasses of wine, 1 Cans of beer per week   Drug use: No   Sexual activity: Yes  Other Topics Concern   Not on file  Social History Narrative  Daily caffeine    Social Determinants of Health   Financial Resource Strain: Not on file  Food Insecurity: Not on file  Transportation Needs: Not on file  Physical Activity: Not on file  Stress: Not on file  Social Connections: Not on file  Intimate Partner Violence: Not on file     Physical Exam: BP (!) 159/80   Pulse (!) 57   Temp 98.5 F (36.9 C)   Resp 17   Ht 5\' 10"  (1.778 m)   Wt 77.1 kg   SpO2 100%   BMI 24.39 kg/m  Constitutional: generally well-appearing Psychiatric: alert and oriented x3 Abdomen: soft, nontender, nondistended, no obvious ascites, no peritoneal signs, normal bowel sounds No peripheral edema noted in lower extremities  Assessment and plan: 75 y.o. male with abnormal stomach, asymptomatic  For upper EUS evaluation today   Please see the "Patient  Instructions" section for addition details about the plan.  Frederick Loffler, MD [facility] 12/07/2021, 8:26 AM

## 2021-12-07 NOTE — Anesthesia Procedure Notes (Signed)
Procedure Name: MAC Date/Time: 12/07/2021 8:46 AM Performed by: Talbot Grumbling, CRNA Pre-anesthesia Checklist: Patient identified, Emergency Drugs available, Suction available and Patient being monitored Oxygen Delivery Method: Simple face mask

## 2021-12-08 ENCOUNTER — Encounter (HOSPITAL_COMMUNITY): Payer: Self-pay | Admitting: Gastroenterology

## 2021-12-08 LAB — SURGICAL PATHOLOGY

## 2021-12-08 LAB — CYTOLOGY - NON PAP

## 2021-12-15 ENCOUNTER — Other Ambulatory Visit: Payer: Self-pay

## 2021-12-15 ENCOUNTER — Other Ambulatory Visit: Payer: Medicare Other | Admitting: *Deleted

## 2021-12-15 DIAGNOSIS — I480 Paroxysmal atrial fibrillation: Secondary | ICD-10-CM

## 2021-12-15 DIAGNOSIS — I77810 Thoracic aortic ectasia: Secondary | ICD-10-CM

## 2021-12-15 DIAGNOSIS — I714 Abdominal aortic aneurysm, without rupture, unspecified: Secondary | ICD-10-CM

## 2021-12-15 DIAGNOSIS — Z01818 Encounter for other preprocedural examination: Secondary | ICD-10-CM

## 2021-12-15 DIAGNOSIS — I2510 Atherosclerotic heart disease of native coronary artery without angina pectoris: Secondary | ICD-10-CM

## 2021-12-15 DIAGNOSIS — I10 Essential (primary) hypertension: Secondary | ICD-10-CM

## 2021-12-15 LAB — BASIC METABOLIC PANEL
BUN/Creatinine Ratio: 14 (ref 10–24)
BUN: 20 mg/dL (ref 8–27)
CO2: 24 mmol/L (ref 20–29)
Calcium: 9.5 mg/dL (ref 8.6–10.2)
Chloride: 98 mmol/L (ref 96–106)
Creatinine, Ser: 1.41 mg/dL — ABNORMAL HIGH (ref 0.76–1.27)
Glucose: 194 mg/dL — ABNORMAL HIGH (ref 70–99)
Potassium: 4.1 mmol/L (ref 3.5–5.2)
Sodium: 138 mmol/L (ref 134–144)
eGFR: 52 mL/min/{1.73_m2} — ABNORMAL LOW (ref >59)

## 2021-12-30 ENCOUNTER — Other Ambulatory Visit: Payer: Self-pay | Admitting: Cardiovascular Disease

## 2022-01-03 ENCOUNTER — Ambulatory Visit: Payer: Medicare Other | Admitting: Cardiovascular Disease

## 2022-01-03 ENCOUNTER — Other Ambulatory Visit: Payer: Self-pay

## 2022-01-03 ENCOUNTER — Encounter: Payer: Self-pay | Admitting: Cardiovascular Disease

## 2022-01-03 VITALS — BP 120/80 | HR 58 | Ht 70.0 in | Wt 172.4 lb

## 2022-01-03 DIAGNOSIS — I2510 Atherosclerotic heart disease of native coronary artery without angina pectoris: Secondary | ICD-10-CM | POA: Diagnosis not present

## 2022-01-03 DIAGNOSIS — I10 Essential (primary) hypertension: Secondary | ICD-10-CM

## 2022-01-03 DIAGNOSIS — E782 Mixed hyperlipidemia: Secondary | ICD-10-CM | POA: Diagnosis not present

## 2022-01-03 DIAGNOSIS — I480 Paroxysmal atrial fibrillation: Secondary | ICD-10-CM | POA: Diagnosis not present

## 2022-01-03 DIAGNOSIS — I7143 Infrarenal abdominal aortic aneurysm, without rupture: Secondary | ICD-10-CM | POA: Diagnosis not present

## 2022-01-03 NOTE — Patient Instructions (Signed)
Medication Instructions:  Your physician recommends that you continue on your current medications as directed. Please refer to the Current Medication list given to you today.   *If you need a refill on your cardiac medications before your next appointment, please call your pharmacy*  Follow-Up: At [facility], you and your health needs are our priority.  As part of our continuing mission to provide you with exceptional heart care, we have created designated Provider Care Teams.  These Care Teams include your primary Cardiologist (physician) and Advanced Practice Providers (APPs -  Physician Assistants and Nurse Practitioners) who all work together to provide you with the care you need, when you need it.  Your next appointment:   1 year(s)  The format for your next appointment:   In Person  Provider:   Sherren Mocha, MD

## 2022-01-03 NOTE — Progress Notes (Signed)
Cardiology Office Note:    Date:  01/03/2022   ID:  Frederick Miles, DOB Dec 20, 1946, MRN [id number]  PCP:  Shon Baton, MD   [facility] Providers Cardiologist:  Sherren Mocha, MD     Referring MD: Shon Baton, MD   Chief Complaint  Patient presents with   Coronary Artery Disease    History of Present Illness:    Frederick Miles is a 76 y.o. male with a hx of coronary artery disease, presenting for follow-up evaluation.  The patient also has a history of paroxysmal atrial fibrillation, infrarenal abdominal aortic aneurysm followed by vascular surgery, hypertension, type 2 diabetes.  The patient is here alone today.  He is doing very well.  He exercises regularly with no exertional symptoms.  He has been compliant with his medication and works hard to keep his diabetes under control.  He denies any chest pain, chest pressure, or shortness of breath.  He had no abdominal pain.  He denies leg swelling, orthopnea, or PND.  Past Medical History:  Diagnosis Date   AAA (abdominal aortic aneurysm) without rupture 04/28/2017   Aorta US 1/18: 4.2 cm AAA   Bladder cancer ([place]) 2006   Cervicalgia    Coronary atherosclerosis of native coronary artery    stent x1   Diabetes mellitus without complication (HCC)    typ2   Esophageal reflux    Esophageal stricture    Heart murmur    slight   History of cardiovascular stress test    Lexiscan Myoview 7/16: EF 66%, diaphragmatic attenuation, no ischemia, low risk   History of kidney stones    Hx of adenomatous colonic polyps    Hyperlipidemia    Hypertension    Myocardial infarct [facility])    2007   Unspecified hypothyroidism     Past Surgical History:  Procedure Laterality Date   BIOPSY  12/07/2021   Procedure: BIOPSY;  Surgeon: Milus Banister, MD;  [facility]: [facility];  Service: Endoscopy;;   COLONOSCOPY     CORONARY ANGIOPLASTY WITH STENT PLACEMENT  2007   CYSTOSCOPY/RETROGRADE/URETEROSCOPY/STONE EXTRACTION WITH BASKET   08/08/2012   Procedure: CYSTOSCOPY/RETROGRADE/URETEROSCOPY/STONE EXTRACTION WITH BASKET;  Surgeon: Malka So, MD;  [facility];  Service: Urology;  Laterality: Left;  Left Ureteroscopy with Stone Extraction   ESOPHAGOGASTRODUODENOSCOPY (EGD) WITH PROPOFOL N/A 12/07/2021   Procedure: ESOPHAGOGASTRODUODENOSCOPY (EGD) WITH PROPOFOL;  Surgeon: Milus Banister, MD;  [facility];  Service: Endoscopy;  Laterality: N/A;   EUS N/A 12/07/2021   Procedure: UPPER ENDOSCOPIC ULTRASOUND (EUS) RADIAL;  Surgeon: Milus Banister, MD;  [facility];  Service: Endoscopy;  Laterality: N/A;   EUS N/A 12/07/2021   Procedure: UPPER ENDOSCOPIC ULTRASOUND (EUS) LINEAR;  Surgeon: Milus Banister, MD;  [facility];  Service: Endoscopy;  Laterality: N/A;   FINE NEEDLE ASPIRATION N/A 12/07/2021   Procedure: FINE NEEDLE ASPIRATION (FNA) LINEAR;  Surgeon: Milus Banister, MD;  [facility];  Service: Endoscopy;  Laterality: N/A;   SKIN TAG REMOVAL  07/09/06   right anterior thigh   TONSILLECTOMY AND ADENOIDECTOMY     TRANSURETHRAL RESECTION OF BLADDER  07/09/06   tumor 2-5 cm   URETEROSCOPY WITH HOLMIUM LASER LITHOTRIPSY Left 01/06/2019   Procedure: LEFT URETEROSCOPY left retrograde pylegram basket extraction stone bladder biopsy fulgeration;  Surgeon: Irine Seal, MD;  [facility];  Service: Urology;  Laterality: Left;    Current Medications: Current Meds  Medication Sig   apixaban (ELIQUIS) 5 MG TABS tablet  Take 1 tablet (5 mg total) by mouth 2 (two) times daily.   atorvastatin (LIPITOR) 40 MG tablet Take 40 mg by mouth daily.   Continuous Blood Gluc Sensor (FREESTYLE LIBRE 2 SENSOR) MISC CHANGE EVERY 14 DAYS TO MONITOR BLOOD GLUCOSE CONTINUOUSLY   diltiazem (CARDIZEM CD) 120 MG 24 hr capsule Take 1 capsule (120 mg total) by mouth daily. Please keep upcoming appt in February 2023 with Dr. Burt Knack before anymore refills. Thank you   empagliflozin (JARDIANCE) 10 MG TABS  tablet Take 10 mg by mouth daily.    hydrochlorothiazide (HYDRODIURIL) 25 MG tablet TAKE 1 TABLET (25 MG TOTAL) BY MOUTH DAILY. PLEASE KEEP UPCOMING APPT FOR FUTURE REFILLS.   insulin glargine (LANTUS SOLOSTAR) 100 UNIT/ML Solostar Pen Inject 24 Units into the skin at bedtime.   insulin lispro (HUMALOG) 100 UNIT/ML injection Inject 12 Units into the skin 3 (three) times daily with meals.   levothyroxine (SYNTHROID, LEVOTHROID) 125 MCG tablet Take 125 mcg by mouth daily before breakfast.   lisinopril (ZESTRIL) 20 MG tablet TAKE 1 TABLET BY MOUTH EVERY DAY   Magnesium 500 MG CAPS Take 500 mg by mouth daily.   metFORMIN (GLUCOPHAGE) 500 MG tablet Take 500 mg by mouth daily.   nitroGLYCERIN (NITROSTAT) 0.4 MG SL tablet Place 0.4 mg under the tongue every 5 (five) minutes as needed for chest pain.   omeprazole (PRILOSEC) 20 MG capsule Take 20 mg by mouth daily.     Allergies:   Patient has no known allergies.   Social History   Socioeconomic History   Marital status: Married    Spouse name: Not on file   Number of children: 2   Years of education: Not on file   Highest education level: Not on file  Occupational History    Employer: [employer]  Tobacco Use   Smoking status: Former    Types: Cigarettes    Quit date: 08/05/1979    Years since quitting: 42.4   Smokeless tobacco: Never  Vaping Use   Vaping Use: Never used  Substance and Sexual Activity   Alcohol use: Yes    Alcohol/week: 5.0 standard drinks    Types: 4 Glasses of wine, 1 Cans of beer per week   Drug use: No   Sexual activity: Yes  Other Topics Concern   Not on file  Social History Narrative   Daily caffeine    Social Determinants of Health   Financial Resource Strain: Not on file  Food Insecurity: Not on file  Transportation Needs: Not on file  Physical Activity: Not on file  Stress: Not on file  Social Connections: Not on file     Family History: The patient's family history includes Heart  attack in his father, paternal grandfather, and paternal uncle; Heart disease in his father and mother. There is no history of Colon cancer, Stroke, Diabetes, Stomach cancer, or Rectal cancer.  ROS:   Please see the history of present illness.    All other systems reviewed and are negative.  EKGs/Labs/Other Studies Reviewed:    The following studies were reviewed today:  Aortic US: Abdominal Aorta: There is evidence of abnormal dilatation of the distal  Abdominal aorta. The largest aortic diameter remains essentially unchanged  compared to prior exam. Previous diameter measurement was 5.1 cm obtained  on 09/14/2020. 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  Echo 06/09/20: IMPRESSIONS     1. Left ventricular ejection fraction, by estimation, is 60 to  65%. The  left ventricle has normal function. The left ventricle has no regional  wall motion abnormalities. Left ventricular diastolic parameters are  indeterminate.   2. Right ventricular systolic function is normal. The right ventricular  size is normal. There is normal pulmonary artery systolic pressure.   3. The mitral valve is normal in structure. No evidence of mitral valve  regurgitation. No evidence of mitral stenosis.   4. The aortic valve is normal in structure. Aortic valve regurgitation is  not visualized. No aortic stenosis is present.   5. Aortic dilatation noted. 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   Nuclear Stress Test: The left ventricular ejection fraction is hyperdynamic (>65%). Nuclear stress EF: 66%. There was no ST segment deviation noted during stress. This is a low risk study.   Low risk stress nuclear study with a small, medium intensity, fixed inferior defect consistent with diaphragmatic attenuation; no ischemia; EF 66 and normal wall motion.   Recent Labs: 10/23/2021: Hemoglobin 16.2; Platelets 202 12/15/2021: BUN 20;  Creatinine, Ser 1.41; Potassium 4.1; Sodium 138  Recent Lipid Panel    Component Value Date/Time   CHOL 123 03/03/2008 0948   TRIG 150 (H) 03/03/2008 0948   HDL 27.6 (L) 03/03/2008 0948   CHOLHDL 4.5 CALC 03/03/2008 0948   VLDL 30 03/03/2008 0948   LDLCALC 65 03/03/2008 0948     Risk Assessment/Calculations:    CHA2DS2-VASc Score = 4   This indicates a 4.8% annual risk of stroke. The patient's score is based upon: CHF History: 0 HTN History: 1 Diabetes History: 1 Stroke History: 0 Vascular Disease History: 1 Age Score: 1 Gender Score: 0          Physical Exam:    VS:  BP 120/80    Pulse (!) 58    Ht 5\' 10"  (1.778 m)    Wt 172 lb 6.4 oz (78.2 kg)    SpO2 98%    BMI 24.74 kg/m     Wt Readings from Last 3 Encounters:  01/03/22 172 lb 6.4 oz (78.2 kg)  12/07/21 170 lb (77.1 kg)  11/14/21 174 lb (78.9 kg)     GEN:  Well nourished, well developed in no acute distress HEENT: Normal NECK: No JVD; No carotid bruits LYMPHATICS: No lymphadenopathy CARDIAC: RRR, 2/6 SEM at the RUSB RESPIRATORY:  Clear to auscultation without rales, wheezing or rhonchi  ABDOMEN: Soft, non-tender, non-distended MUSCULOSKELETAL:  No edema; No deformity  SKIN: Warm and dry NEUROLOGIC:  Alert and oriented x 3 PSYCHIATRIC:  Normal affect   ASSESSMENT:    1. Coronary artery disease involving native coronary artery of native heart without angina pectoris   2. Paroxysmal atrial fibrillation (HCC)   3. Infrarenal abdominal aortic aneurysm (AAA) without rupture   4. Mixed hyperlipidemia   5. Essential hypertension    PLAN:    In order of problems listed above:  The patient is doing very well without symptoms of angina.  He is not on antiplatelet therapy because of chronic oral anticoagulation.  Patient takes high intensity statin drug.  He will follow-up in 1 year.  His last stress test from 2016 is reviewed.  With his absence of symptoms, he does not require further stress testing at this  point. No recent symptoms.  Tolerating oral anticoagulation with apixaban. Followed closely by vascular surgery.  19-month follow-up planned.  Most recent vascular ultrasound study as above. Treated with atorvastatin 40 mg daily.   Lipids reviewed  with an LDL cholesterol of 54.  Blood pressure is well controlled on hydrochlorothiazide, lisinopril, and diltiazem.  Labs reviewed with an creatinine of 1.41 and a potassium of 4.1.           Medication Adjustments/Labs and Tests Ordered: Current medicines are reviewed at length with the patient today.  Concerns regarding medicines are outlined above.  No orders of the defined types were placed in this encounter.  No orders of the defined types were placed in this encounter.   Patient Instructions  Medication Instructions:  Your physician recommends that you continue on your current medications as directed. Please refer to the Current Medication list given to you today.   *If you need a refill on your cardiac medications before your next appointment, please call your pharmacy*  Follow-Up: At [facility], you and your health needs are our priority.  As part of our continuing mission to provide you with exceptional heart care, we have created designated Provider Care Teams.  These Care Teams include your primary Cardiologist (physician) and Advanced Practice Providers (APPs -  Physician Assistants and Nurse Practitioners) who all work together to provide you with the care you need, when you need it.  Your next appointment:   1 year(s)  The format for your next appointment:   In Person  Provider:   Sherren Mocha, MD        Signed, Sherren Mocha, MD  01/03/2022 1:30 PM    [place]

## 2022-02-23 ENCOUNTER — Ambulatory Visit: Payer: Medicare Other | Admitting: Cardiovascular Disease

## 2022-02-27 ENCOUNTER — Other Ambulatory Visit: Payer: Self-pay

## 2022-02-27 DIAGNOSIS — I714 Abdominal aortic aneurysm, without rupture, unspecified: Secondary | ICD-10-CM

## 2022-03-20 ENCOUNTER — Ambulatory Visit
Admission: RE | Admit: 2022-03-20 | Discharge: 2022-03-20 | Disposition: A | Discharge status: Home / Self Care | Payer: Medicare Other | Source: Ambulatory Visit | Attending: Vascular Surgery | Admitting: Vascular Surgery

## 2022-03-20 ENCOUNTER — Other Ambulatory Visit: Payer: Self-pay

## 2022-03-20 DIAGNOSIS — I714 Abdominal aortic aneurysm, without rupture, unspecified: Secondary | ICD-10-CM

## 2022-03-20 MED ORDER — IOPAMIDOL (ISOVUE-370) INJECTION 76%
60.0000 mL | Freq: Once | INTRAVENOUS | Status: AC | PRN
  Administered 2022-03-20: 60 mL via INTRAVENOUS

## 2022-03-21 ENCOUNTER — Encounter: Payer: Self-pay | Admitting: Vascular Surgery

## 2022-03-21 ENCOUNTER — Ambulatory Visit: Payer: Medicare Other | Admitting: Vascular Surgery

## 2022-03-21 VITALS — BP 118/70 | HR 61 | Temp 98.2°F | Resp 20 | Ht 70.0 in | Wt 169.0 lb

## 2022-03-21 DIAGNOSIS — I714 Abdominal aortic aneurysm, without rupture, unspecified: Secondary | ICD-10-CM

## 2022-03-21 NOTE — Progress Notes (Signed)
? ?Patient ID: Frederick Miles, male   DOB: February 28, 1946, 76 y.o.   MRN: [id number] ? ?Reason for Consult: Follow-up ?  ?Referred by Shon Baton, MD ? ?Subjective:  ?   ?HPI: ? ?Frederick Miles is a 76 y.o. male with history of saccular abdominal aortic aneurysm that he has known about for several years.  He is very active and fishing he has not been able to play golf due to hip pain.  He has no new back or abdominal pain.  Walks without limitation.  No history of stroke, TIA or amaurosis.  He his father did have an abdominal aortic aneurysm as well that was not treated.  Risk factors for vascular disease include hypertension, hyperlipidemia and diabetes.  He is on Eliquis and also takes a statin. ? ?Past Medical History:  ?Diagnosis Date  ? AAA (abdominal aortic aneurysm) without rupture 04/28/2017  ? Aorta US 1/18: 4.2 cm AAA  ? Bladder cancer [facility]) 2006  ? Cervicalgia   ? Coronary atherosclerosis of native coronary artery   ? stent x1  ? Diabetes mellitus without complication ([place])   ? typ2  ? Esophageal reflux   ? Esophageal stricture   ? Heart murmur   ? slight  ? History of cardiovascular stress test   ? Lexiscan Myoview 7/16: EF 66%, diaphragmatic attenuation, no ischemia, low risk  ? History of kidney stones   ? Hx of adenomatous colonic polyps   ? Hyperlipidemia   ? Hypertension   ? Myocardial infarct [facility])   ? 2007  ? Unspecified hypothyroidism   ? ?Family History  ?Problem Relation Age of Onset  ? Heart disease Father   ? Heart attack Father   ? Heart disease Mother   ? Heart attack Paternal Grandfather   ? Heart attack Paternal Uncle   ? Colon cancer Neg Hx   ? Stroke Neg Hx   ? Diabetes Neg Hx   ? Stomach cancer Neg Hx   ? Rectal cancer Neg Hx   ? ?Past Surgical History:  ?Procedure Laterality Date  ? BIOPSY  12/07/2021  ? Procedure: BIOPSY;  Surgeon: Milus Banister, MD;  Location: [facility];  Service: Endoscopy;;  ? COLONOSCOPY    ? CORONARY ANGIOPLASTY WITH STENT PLACEMENT  2007  ?  CYSTOSCOPY/RETROGRADE/URETEROSCOPY/STONE EXTRACTION WITH BASKET  08/08/2012  ? Procedure: CYSTOSCOPY/RETROGRADE/URETEROSCOPY/STONE EXTRACTION WITH BASKET;  Surgeon: Malka So, MD;  [facility];  Service: Urology;  Laterality: Left;  Left Ureteroscopy with Stone Extraction  ? ESOPHAGOGASTRODUODENOSCOPY (EGD) WITH PROPOFOL N/A 12/07/2021  ? Procedure: ESOPHAGOGASTRODUODENOSCOPY (EGD) WITH PROPOFOL;  Surgeon: Milus Banister, MD;  [facility];  Service: Endoscopy;  Laterality: N/A;  ? EUS N/A 12/07/2021  ? Procedure: UPPER ENDOSCOPIC ULTRASOUND (EUS) RADIAL;  Surgeon: Milus Banister, MD;  [facility];  Service: Endoscopy;  Laterality: N/A;  ? EUS N/A 12/07/2021  ? Procedure: UPPER ENDOSCOPIC ULTRASOUND (EUS) LINEAR;  Surgeon: Milus Banister, MD;  [facility];  Service: Endoscopy;  Laterality: N/A;  ? FINE NEEDLE ASPIRATION N/A 12/07/2021  ? Procedure: FINE NEEDLE ASPIRATION (FNA) LINEAR;  Surgeon: Milus Banister, MD;  [facility];  Service: Endoscopy;  Laterality: N/A;  ? SKIN TAG REMOVAL  07/09/06  ? right anterior thigh  ? TONSILLECTOMY AND ADENOIDECTOMY    ? TRANSURETHRAL RESECTION OF BLADDER  07/09/06  ? tumor 2-5 cm  ? URETEROSCOPY WITH HOLMIUM LASER LITHOTRIPSY Left 01/06/2019  ? Procedure: LEFT URETEROSCOPY left retrograde pylegram basket extraction  stone bladder biopsy fulgeration;  Surgeon: Irine Seal, MD;  [facility];  Service: Urology;  Laterality: Left;  ? ? ?Short Social History:  ?Social History  ? ?Tobacco Use  ? Smoking status: Former  ?  Types: Cigarettes  ?  Quit date: 08/05/1979  ?  Years since quitting: 42.6  ? Smokeless tobacco: Never  ?Substance Use Topics  ? Alcohol use: Yes  ?  Alcohol/week: 5.0 standard drinks  ?  Types: 4 Glasses of wine, 1 Cans of beer per week  ? ? ?No Known Allergies ? ?Current Outpatient Medications  ?Medication Sig Dispense Refill  ? apixaban (ELIQUIS) 5 MG TABS tablet Take 1 tablet (5 mg total) by mouth 2 (two) times  daily. 60 tablet 11  ? atorvastatin (LIPITOR) 40 MG tablet Take 40 mg by mouth daily.    ? Continuous Blood Gluc Sensor (FREESTYLE LIBRE 2 SENSOR) MISC CHANGE EVERY 14 DAYS TO MONITOR BLOOD GLUCOSE CONTINUOUSLY    ? diltiazem (CARDIZEM CD) 120 MG 24 hr capsule Take 1 capsule (120 mg total) by mouth daily. Please keep upcoming appt in February 2023 with Dr. Burt Knack before anymore refills. Thank you 90 capsule 1  ? empagliflozin (JARDIANCE) 10 MG TABS tablet Take 10 mg by mouth daily.     ? hydrochlorothiazide (HYDRODIURIL) 25 MG tablet TAKE 1 TABLET (25 MG TOTAL) BY MOUTH DAILY. PLEASE KEEP UPCOMING APPT FOR FUTURE REFILLS. 30 tablet 11  ? insulin glargine (LANTUS SOLOSTAR) 100 UNIT/ML Solostar Pen Inject 24 Units into the skin at bedtime.    ? insulin lispro (HUMALOG) 100 UNIT/ML injection Inject 12 Units into the skin 3 (three) times daily with meals.    ? levothyroxine (SYNTHROID, LEVOTHROID) 125 MCG tablet Take 125 mcg by mouth daily before breakfast.    ? lisinopril (ZESTRIL) 20 MG tablet TAKE 1 TABLET BY MOUTH EVERY DAY 90 tablet 0  ? Magnesium 500 MG CAPS Take 500 mg by mouth daily.    ? metFORMIN (GLUCOPHAGE) 500 MG tablet Take 500 mg by mouth daily.    ? nitroGLYCERIN (NITROSTAT) 0.4 MG SL tablet Place 0.4 mg under the tongue every 5 (five) minutes as needed for chest pain.    ? omeprazole (PRILOSEC) 20 MG capsule Take 20 mg by mouth daily.    ? ?No current facility-administered medications for this visit.  ? ? ?Review of Systems  ?Constitutional:  Constitutional negative. ?HENT: HENT negative.  ?Eyes: Eyes negative.  ?Respiratory: Respiratory negative.  ?Cardiovascular: Cardiovascular negative.  ?GI: Gastrointestinal negative.  ?Musculoskeletal: Positive for joint pain.  ?Skin: Skin negative.  ?Neurological: Neurological negative. ?Hematologic: Hematologic/lymphatic negative.  ?Psychiatric: Psychiatric negative.   ? ?   ?Objective:  ?Objective  ?Vitals:  ? 03/21/22 1350  ?BP: 118/70  ?Pulse: 61  ?Resp: 20   ?Temp: 98.2 ?F (36.8 ?C)  ?SpO2: 98%  ? ? ?Physical Exam ?HENT:  ?   Head: Normocephalic.  ?   Nose:  ?   Comments: Wearing a mask ?Eyes:  ?   Pupils: Pupils are equal, round, and reactive to light.  ?Cardiovascular:  ?   Rate and Rhythm: Normal rate.  ?   Pulses: Normal pulses.  ?Pulmonary:  ?   Effort: Pulmonary effort is normal.  ?Abdominal:  ?   General: Abdomen is flat.  ?   Palpations: Abdomen is soft. There is no mass.  ?Musculoskeletal:     ?   General: Normal range of motion.  ?   Cervical back: Normal range of motion  and neck supple.  ?   Right lower leg: No edema.  ?   Left lower leg: No edema.  ?Skin: ?   General: Skin is warm and dry.  ?   Capillary Refill: Capillary refill takes less than 2 seconds.  ?Neurological:  ?   General: No focal deficit present.  ?   Mental Status: He is alert.  ?Psychiatric:     ?   Mood and Affect: Mood normal.     ?   Behavior: Behavior normal.     ?   Thought Content: Thought content normal.     ?   Judgment: Judgment normal.  ? ? ?Data: ?CTA IMPRESSION: ?VASCULAR ?  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 ?Stable mild infrarenal aortic narrowing of the residual true lumen ?and iliac bifurcation. No aortoiliac occlusive process or iliac ?inflow disease. ?  ?Mesenteric and renal vasculature all remain patent. ?  ?No new or acute vascular process. ?  ?  ?    ?Assessment/Plan:  ?  ?76 year old male with stable saccular aneurysm of his infrarenal aorta down to the level of the bifurcation.  This really appears stable going back as far as 2007 but by duplex has always been demonstrated to be larger than what we see on CT.  I have offered the patient continued duplex scans he has elected for CT scans and we will perform this every 2 years given the stability.  We discussed the signs and symptoms of complications from the aneurysm and he demonstrates good understanding.  Short of  this we will see him in 2 years. ? ?  ? ?Waynetta Sandy MD ?[facility] ? ? ?

## 2022-07-21 ENCOUNTER — Other Ambulatory Visit: Payer: Self-pay | Admitting: Cardiovascular Disease

## 2022-12-09 ENCOUNTER — Other Ambulatory Visit: Payer: Self-pay | Admitting: Physician Assistant

## 2022-12-10 NOTE — Telephone Encounter (Signed)
Prescription refill request for Eliquis received. Indication:  PAF Last office visit: 01/03/22  Ezzie Dural MD Scr: 1.41 on 12/15/21 Age: 76 Weight: 78.2kg  Based on above findings Eliquis '5mg'$  twice daily is the appropriate dose.  Refill approved.

## 2023-04-18 ENCOUNTER — Ambulatory Visit: Payer: Medicare Other | Attending: Cardiovascular Disease | Admitting: Cardiovascular Disease

## 2023-04-18 ENCOUNTER — Encounter: Payer: Self-pay | Admitting: Cardiovascular Disease

## 2023-04-18 ENCOUNTER — Other Ambulatory Visit: Payer: Self-pay

## 2023-04-18 VITALS — BP 120/76 | HR 55 | Ht 70.0 in | Wt 168.0 lb

## 2023-04-18 DIAGNOSIS — I10 Essential (primary) hypertension: Secondary | ICD-10-CM | POA: Diagnosis not present

## 2023-04-18 DIAGNOSIS — I2510 Atherosclerotic heart disease of native coronary artery without angina pectoris: Secondary | ICD-10-CM

## 2023-04-18 DIAGNOSIS — I7143 Infrarenal abdominal aortic aneurysm, without rupture: Secondary | ICD-10-CM

## 2023-04-18 DIAGNOSIS — I480 Paroxysmal atrial fibrillation: Secondary | ICD-10-CM | POA: Diagnosis not present

## 2023-04-18 DIAGNOSIS — R011 Cardiac murmur, unspecified: Secondary | ICD-10-CM

## 2023-04-18 NOTE — Progress Notes (Signed)
Cardiology Office Note:    Date:  04/18/2023   ID:  Frederick Miles, DOB 1946/06/22, MRN [id number]  PCP:  Creola Corn, MD   [facility] Providers Cardiologist:  Tonny Bollman, MD     Referring MD: Creola Corn, MD   Chief Complaint  Patient presents with   Coronary Artery Disease   Atrial Fibrillation    History of Present Illness:    Frederick Miles is a 77 y.o. male with a hx of coronary artery disease, presenting for follow-up evaluation. The patient also has a history of paroxysmal atrial fibrillation, infrarenal abdominal aortic aneurysm followed by vascular surgery, hypertension, type 2 diabetes.   The patient is here alone today.  He is doing well and has no cardiac-related complaints.  He is playing golf and going fishing.  He denies any chest pain, chest pressure, or shortness of breath.  He is had no recent heart palpitations.  Past Medical History:  Diagnosis Date   AAA (abdominal aortic aneurysm) without rupture 04/28/2017   Aorta US 1/18: 4.2 cm AAA   Bladder cancer 2006   Cervicalgia    Coronary atherosclerosis of native coronary artery    stent x1   Diabetes mellitus without complication    typ2   Esophageal reflux    Esophageal stricture    Heart murmur    slight   History of cardiovascular stress test    Lexiscan Myoview 7/16: EF 66%, diaphragmatic attenuation, no ischemia, low risk   History of kidney stones    Hx of adenomatous colonic polyps    Hyperlipidemia    Hypertension    Myocardial infarct    2007   Unspecified hypothyroidism     Past Surgical History:  Procedure Laterality Date   BIOPSY  12/07/2021   Procedure: BIOPSY;  Surgeon: Rachael Fee, MD;  Location: [facility];  Service: Endoscopy;;   COLONOSCOPY     CORONARY ANGIOPLASTY WITH STENT PLACEMENT  2007   CYSTOSCOPY/RETROGRADE/URETEROSCOPY/STONE EXTRACTION WITH BASKET  08/08/2012   Procedure: CYSTOSCOPY/RETROGRADE/URETEROSCOPY/STONE EXTRACTION WITH BASKET;  Surgeon:  Anner Crete, MD;  Location: [facility];  Service: Urology;  Laterality: Left;  Left Ureteroscopy with Stone Extraction   ESOPHAGOGASTRODUODENOSCOPY (EGD) WITH PROPOFOL N/A 12/07/2021   Procedure: ESOPHAGOGASTRODUODENOSCOPY (EGD) WITH PROPOFOL;  Surgeon: Rachael Fee, MD;  Location: [facility];  Service: Endoscopy;  Laterality: N/A;   EUS N/A 12/07/2021   Procedure: UPPER ENDOSCOPIC ULTRASOUND (EUS) RADIAL;  Surgeon: Rachael Fee, MD;  Location: [facility];  Service: Endoscopy;  Laterality: N/A;   EUS N/A 12/07/2021   Procedure: UPPER ENDOSCOPIC ULTRASOUND (EUS) LINEAR;  Surgeon: Rachael Fee, MD;  Location: [facility];  Service: Endoscopy;  Laterality: N/A;   FINE NEEDLE ASPIRATION N/A 12/07/2021   Procedure: FINE NEEDLE ASPIRATION (FNA) LINEAR;  Surgeon: Rachael Fee, MD;  Location: [facility];  Service: Endoscopy;  Laterality: N/A;   SKIN TAG REMOVAL  07/09/06   right anterior thigh   TONSILLECTOMY AND ADENOIDECTOMY     TRANSURETHRAL RESECTION OF BLADDER  07/09/06   tumor 2-5 cm   URETEROSCOPY WITH HOLMIUM LASER LITHOTRIPSY Left 01/06/2019   Procedure: LEFT URETEROSCOPY left retrograde pylegram basket extraction stone bladder biopsy fulgeration;  Surgeon: Bjorn Pippin, MD;  Location: [facility];  Service: Urology;  Laterality: Left;    Current Medications: Current Meds  Medication Sig   apixaban (ELIQUIS) 5 MG TABS tablet TAKE 1 TABLET BY MOUTH TWICE A DAY   atorvastatin (LIPITOR) 40 MG tablet Take 40  mg by mouth daily.   Continuous Blood Gluc Sensor (FREESTYLE LIBRE 2 SENSOR) MISC CHANGE EVERY 14 DAYS TO MONITOR BLOOD GLUCOSE CONTINUOUSLY   diltiazem (CARDIZEM CD) 120 MG 24 hr capsule Take 1 capsule (120 mg total) by mouth daily.   empagliflozin (JARDIANCE) 10 MG TABS tablet Take 10 mg by mouth daily.    hydrochlorothiazide (HYDRODIURIL) 25 MG tablet Take 1 tablet (25 mg total) by mouth daily.   insulin glargine (LANTUS SOLOSTAR) 100 UNIT/ML Solostar Pen Inject 24 Units into the  skin at bedtime.   insulin lispro (HUMALOG) 100 UNIT/ML injection Inject 12 Units into the skin 3 (three) times daily with meals.   levothyroxine (SYNTHROID, LEVOTHROID) 125 MCG tablet Take 125 mcg by mouth daily before breakfast.   lisinopril (ZESTRIL) 20 MG tablet TAKE 1 TABLET BY MOUTH EVERY DAY   Magnesium 500 MG CAPS Take 500 mg by mouth daily.   metFORMIN (GLUCOPHAGE) 500 MG tablet Take 500 mg by mouth daily.   nitroGLYCERIN (NITROSTAT) 0.4 MG SL tablet Place 0.4 mg under the tongue every 5 (five) minutes as needed for chest pain.   omeprazole (PRILOSEC) 20 MG capsule Take 20 mg by mouth daily.     Allergies:   Quinolones   Social History   Socioeconomic History   Marital status: Married    Spouse name: Not on file   Number of children: 2   Years of education: Not on file   Highest education level: Not on file  Occupational History    Employer: [employer]  Tobacco Use   Smoking status: Former    Types: Cigarettes    Quit date: 08/05/1979    Years since quitting: 43.7   Smokeless tobacco: Never  Vaping Use   Vaping Use: Never used  Substance and Sexual Activity   Alcohol use: Yes    Alcohol/week: 5.0 standard drinks of alcohol    Types: 4 Glasses of wine, 1 Cans of beer per week   Drug use: No   Sexual activity: Yes  Other Topics Concern   Not on file  Social History Narrative   Daily caffeine    Social Determinants of Health   Financial Resource Strain: Not on file  Food Insecurity: Not on file  Transportation Needs: Not on file  Physical Activity: Unknown (08/11/2018)   Exercise Vital Sign    Days of Exercise per Week: 4 days    Minutes of Exercise per Session: Not on file  Stress: Not on file  Social Connections: Not on file     Family History: The patient's family history includes Heart attack in his father, paternal grandfather, and paternal uncle; Heart disease in his father and mother. There is no history of Colon cancer, Stroke, Diabetes,  Stomach cancer, or Rectal cancer.  ROS:   Please see the history of present illness.    All other systems reviewed and are negative.  EKGs/Labs/Other Studies Reviewed:    The following studies were reviewed today: Cardiac Studies & Procedures     STRESS TESTS  MYOCARDIAL PERFUSION IMAGING 07/19/2015  Narrative  The left ventricular ejection fraction is hyperdynamic (>65%).  Nuclear stress EF: 66%.  There was no ST segment deviation noted during stress.  This is a low risk study.  Low risk stress nuclear study with a small, medium intensity, fixed inferior defect consistent with diaphragmatic attenuation; no ischemia; EF 66 and normal wall motion.   ECHOCARDIOGRAM  ECHOCARDIOGRAM COMPLETE 06/09/2020  Narrative ECHOCARDIOGRAM REPORT    Patient  Name:   Frederick Miles Date of Exam: 06/09/2020 Medical Rec #:  [id number]        Height:       70.0 in Accession #:    [id number]       Weight:       165.0 lb Date of Birth:  Jan 16, 1946        BSA:          1.923 m Patient Age:    73 years         BP:           127/72 mmHg Patient Gender: M                HR:           56 bpm. Exam Location:  [address]  Procedure: 2D Echo, 3D Echo, Cardiac Doppler and Color Doppler  Indications:    I77.810 Ascending aorta dilatation  History:        Patient has prior history of Echocardiogram examinations, most recent 12/07/2014. NSTEMI; Risk Factors:Hypertension, Diabetes and HLD.  Sonographer:    Clearence Ped RCS Referring Phys: [id number] SCOTT T WEAVER  IMPRESSIONS   1. Left ventricular ejection fraction, by estimation, is 60 to 65%. The left ventricle has normal function. The left ventricle has no regional wall motion abnormalities. Left ventricular diastolic parameters are indeterminate. 2. Right ventricular systolic function is normal. The right ventricular size is normal. There is normal pulmonary artery systolic pressure. 3. The mitral valve is normal in structure. No evidence of  mitral valve regurgitation. No evidence of mitral stenosis. 4. The aortic valve is normal in structure. Aortic valve regurgitation is not visualized. No aortic stenosis is present. 5. Aortic dilatation noted. 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  FINDINGS Left Ventricle: Left ventricular ejection fraction, by estimation, is 60 to 65%. The left ventricle has normal function. The left ventricle has no regional wall motion abnormalities. The left ventricular internal cavity size was normal in size. There is no left ventricular hypertrophy. Left ventricular diastolic parameters are indeterminate.  Right Ventricle: The right ventricular size is normal. No increase in right ventricular wall thickness. Right ventricular systolic function is normal. There is normal pulmonary artery systolic pressure. The tricuspid regurgitant velocity is 2.27 m/s, and with an assumed right atrial pressure of 3 mmHg, the estimated right ventricular systolic pressure is 23.6 mmHg.  Left Atrium: Left atrial size was normal in size.  Right Atrium: Right atrial size was normal in size.  Pericardium: There is no evidence of pericardial effusion.  Mitral Valve: The mitral valve is normal in structure. No evidence of mitral valve regurgitation. No evidence of mitral valve stenosis.  Tricuspid Valve: The tricuspid valve is normal in structure. Tricuspid valve regurgitation is trivial. No evidence of tricuspid stenosis.  Aortic Valve: The aortic valve is normal in structure. Aortic valve regurgitation is not visualized. Aortic regurgitation PHT measures 203 msec. No aortic stenosis is present.  Pulmonic Valve: The pulmonic valve was normal in structure. Pulmonic valve regurgitation is not visualized. No evidence of pulmonic stenosis.  Aorta: Aortic dilatation noted. 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  IAS/Shunts: The atrial septum is grossly normal.   LEFT  VENTRICLE PLAX 2D LVIDd:         4.50 cm  Diastology LVIDs:         2.75 cm  LV e' lateral:   12.30 cm/s LV PW:  0.81 cm  LV E/e' lateral: 6.8 LV IVS:        0.99 cm  LV e' medial:    6.74 cm/s LVOT diam:     2.00 cm  LV E/e' medial:  12.4 LV SV:         106 LV SV Index:   55 LVOT Area:     3.14 cm  3D Volume EF: 3D EF:        77 % LV EDV:       99 ml LV ESV:       23 ml LV SV:        76 ml  RIGHT VENTRICLE TAPSE (M-mode): 2.1 cm RVSP:           23.6 mmHg  LEFT ATRIUM             Index       RIGHT ATRIUM           Index LA diam:        3.40 cm 1.77 cm/m  RA Pressure: 3.00 mmHg LA Vol (A2C):   35.5 ml 18.46 ml/m RA Area:     14.60 cm LA Vol (A4C):   21.0 ml 10.92 ml/m RA Volume:   35.00 ml  18.20 ml/m LA Biplane Vol: 28.4 ml 14.77 ml/m AORTIC VALVE LVOT Vmax:   128.00 cm/s LVOT Vmean:  87.700 cm/s LVOT VTI:    0.336 m AI PHT:      203 msec  AORTA Ao Root diam: 3.70 cm  MITRAL VALVE               TRICUSPID VALVE MV Area (PHT):             TR Peak grad:   20.6 mmHg MV Decel Time:             TR Vmax:        227.00 cm/s MV E velocity: 83.90 cm/s  Estimated RAP:  3.00 mmHg MV A velocity: 95.10 cm/s  RVSP:           23.6 mmHg MV E/A ratio:  0.88 SHUNTS Systemic VTI:  0.34 m Systemic Diam: 2.00 cm  Kristeen Miss MD Electronically signed by Kristeen Miss MD Signature Date/Time: 06/09/2020/12:19:26 PM    Final              EKG:  EKG is ordered today.  The ekg ordered today demonstrates sinus bradycardia 55 bpm, otherwise normal.  Recent Labs: No results found for requested labs within last 365 days.  Recent Lipid Panel    Component Value Date/Time   CHOL 123 03/03/2008 0948   TRIG 150 (H) 03/03/2008 0948   HDL 27.6 (L) 03/03/2008 0948   CHOLHDL 4.5 CALC 03/03/2008 0948   VLDL 30 03/03/2008 0948   LDLCALC 65 03/03/2008 0948     Risk Assessment/Calculations:                Physical Exam:    VS:  BP 120/76   Pulse (!) 55   Ht   (1.778 m)   Wt 168 lb (76.2 kg)   SpO2 95%   BMI 24.11 kg/m     Wt Readings from Last 3 Encounters:  04/18/23 168 lb (76.2 kg)  03/21/22 169 lb (76.7 kg)  01/03/22 172 lb 6.4 oz (78.2 kg)     GEN:  Well nourished, well developed in no acute distress HEENT: Normal NECK: No JVD; No carotid bruits LYMPHATICS: No lymphadenopathy  CARDIAC: RRR, 2/6 systolic murmur at the right upper sternal border RESPIRATORY:  Clear to auscultation without rales, wheezing or rhonchi  ABDOMEN: Soft, non-tender, non-distended MUSCULOSKELETAL:  No edema; No deformity  SKIN: Warm and dry NEUROLOGIC:  Alert and oriented x 3 PSYCHIATRIC:  Normal affect   ASSESSMENT:    1. Infrarenal abdominal aortic aneurysm (AAA) without rupture   2. Essential hypertension   3. Paroxysmal atrial fibrillation   4. Coronary artery disease involving native coronary artery of native heart without angina pectoris   5. Heart murmur    PLAN:    In order of problems listed above:  Followed by Dr. Randie Heinz.  Stable findings noted on CTA of the abdomen in March 2023.  Notes reviewed with plans to repeat in 2-year intervals.  This has been stable for many years. Blood pressure is well-controlled on a combination of diltiazem, hydrochlorothiazide, and lisinopril.  No changes made today. No symptoms.  Tolerating apixaban without bleeding problems. Doing well at present.  No anginal symptoms with his physical activities.  No functional limitation related to CAD.  No antiplatelet therapy because of chronic oral anticoagulation with apixaban.  Continue high intensity statin drug.  LDL cholesterol is 53 mg/dL. Suggestive of aortic sclerosis or mild aortic stenosis.  Recommend 2D echo.           Medication Adjustments/Labs and Tests Ordered: Current medicines are reviewed at length with the patient today.  Concerns regarding medicines are outlined above.  Orders Placed This Encounter  Procedures   ECHOCARDIOGRAM COMPLETE   No  orders of the defined types were placed in this encounter.   Patient Instructions  Medication Instructions:  Your physician recommends that you continue on your current medications as directed. Please refer to the Current Medication list given to you today.  *If you need a refill on your cardiac medications before your next appointment, please call your pharmacy*   Lab Work: NONE If you have labs (blood work) drawn today and your tests are completely normal, you will receive your results only by: MyChart Message (if you have MyChart) OR A paper copy in the mail If you have any lab test that is abnormal or we need to change your treatment, we will call you to review the results.   Testing/Procedures: ECHO Your physician has requested that you have an echocardiogram. Echocardiography is a painless test that uses sound waves to create images of your heart. It provides your doctor with information about the size and shape of your heart and how well your heart's chambers and valves are working. This procedure takes approximately one hour. There are no restrictions for this procedure. Please do NOT wear cologne, perfume, aftershave, or lotions (deodorant is allowed). Please arrive 15 minutes prior to your appointment time.  Follow-Up: At [facility], you and your health needs are our priority.  As part of our continuing mission to provide you with exceptional heart care, we have created designated Provider Care Teams.  These Care Teams include your primary Cardiologist (physician) and Advanced Practice Providers (APPs -  Physician Assistants and Nurse Practitioners) who all work together to provide you with the care you need, when you need it.  Your next appointment:   1 year(s)  Provider:   Tonny Bollman, MD        Signed, Tonny Bollman, MD  04/18/2023 4:48 PM    [facility]

## 2023-04-18 NOTE — Patient Instructions (Signed)
Medication Instructions:  Your physician recommends that you continue on your current medications as directed. Please refer to the Current Medication list given to you today.  *If you need a refill on your cardiac medications before your next appointment, please call your pharmacy*   Lab Work: NONE If you have labs (blood work) drawn today and your tests are completely normal, you will receive your results only by: MyChart Message (if you have MyChart) OR A paper copy in the mail If you have any lab test that is abnormal or we need to change your treatment, we will call you to review the results.   Testing/Procedures: ECHO Your physician has requested that you have an echocardiogram. Echocardiography is a painless test that uses sound waves to create images of your heart. It provides your doctor with information about the size and shape of your heart and how well your heart's chambers and valves are working. This procedure takes approximately one hour. There are no restrictions for this procedure. Please do NOT wear cologne, perfume, aftershave, or lotions (deodorant is allowed). Please arrive 15 minutes prior to your appointment time.  Follow-Up: At [facility], you and your health needs are our priority.  As part of our continuing mission to provide you with exceptional heart care, we have created designated Provider Care Teams.  These Care Teams include your primary Cardiologist (physician) and Advanced Practice Providers (APPs -  Physician Assistants and Nurse Practitioners) who all work together to provide you with the care you need, when you need it.  Your next appointment:   1 year(s)  Provider:   Michael Cooper, MD     

## 2023-05-16 ENCOUNTER — Ambulatory Visit (HOSPITAL_COMMUNITY): Payer: Medicare Other | Attending: Cardiology

## 2023-05-16 DIAGNOSIS — I7143 Infrarenal abdominal aortic aneurysm, without rupture: Secondary | ICD-10-CM | POA: Diagnosis not present

## 2023-05-16 DIAGNOSIS — I2510 Atherosclerotic heart disease of native coronary artery without angina pectoris: Secondary | ICD-10-CM | POA: Insufficient documentation

## 2023-05-16 DIAGNOSIS — I480 Paroxysmal atrial fibrillation: Secondary | ICD-10-CM | POA: Insufficient documentation

## 2023-05-16 DIAGNOSIS — I10 Essential (primary) hypertension: Secondary | ICD-10-CM | POA: Insufficient documentation

## 2023-05-16 LAB — ECHOCARDIOGRAM COMPLETE
Area-P 1/2: 3.75 cm2
S' Lateral: 2.6 cm

## 2023-05-19 ENCOUNTER — Other Ambulatory Visit: Payer: Self-pay | Admitting: Cardiovascular Disease

## 2023-05-28 ENCOUNTER — Telehealth: Payer: Self-pay | Admitting: Cardiovascular Disease

## 2023-05-28 DIAGNOSIS — I712 Thoracic aortic aneurysm, without rupture, unspecified: Secondary | ICD-10-CM

## 2023-05-28 NOTE — Telephone Encounter (Signed)
Called and spoke with patient who understands need for gated CT of Aorta. Order placed at this time and directions sent via MyChart.

## 2023-05-28 NOTE — Telephone Encounter (Signed)
-----   Message from Tonny Bollman, MD sent at 05/28/2023 12:25 PM EDT ----- Echo with findings of normal cardiac function and no significant valve disease. Aortic sclerosis is the likely cause of the patient's murmur. This can be followed with annual clinical follow-up and a repeat echo in 3-5 years or if his exam changes. Incidental finding of thoracic aortic aneurysm needs to be better defined. Please order a gated CTA of the chest to better evaluate this. thx

## 2023-06-10 ENCOUNTER — Other Ambulatory Visit: Payer: Self-pay | Admitting: Cardiovascular Disease

## 2023-06-10 DIAGNOSIS — I480 Paroxysmal atrial fibrillation: Secondary | ICD-10-CM

## 2023-06-10 NOTE — Telephone Encounter (Signed)
Prescription refill request for Eliquis received. Indication:afib Last office visit:4/24 GEX:BMWUX labs Age: 77 Weight:76.2  kg  Prescription refilled

## 2023-06-25 ENCOUNTER — Ambulatory Visit: Payer: Medicare Other | Attending: Cardiovascular Disease

## 2023-06-25 LAB — CBC WITH DIFFERENTIAL/PLATELET
Basophils Absolute: 0 10*3/uL (ref 0.0–0.2)
Basos: 0 %
EOS (ABSOLUTE): 0.2 10*3/uL (ref 0.0–0.4)
Eos: 3 %
Hematocrit: 44.5 % (ref 37.5–51.0)
Hemoglobin: 15.2 g/dL (ref 13.0–17.7)
Lymphocytes Absolute: 1.1 10*3/uL (ref 0.7–3.1)
Lymphs: 16 %
MCH: 33.4 pg — ABNORMAL HIGH (ref 26.6–33.0)
MCHC: 34.2 g/dL (ref 31.5–35.7)
MCV: 98 fL — ABNORMAL HIGH (ref 79–97)
Monocytes Absolute: 0.5 10*3/uL (ref 0.1–0.9)
Monocytes: 7 %
Neutrophils Absolute: 5.1 10*3/uL (ref 1.4–7.0)
Neutrophils: 74 %
Platelets: 207 10*3/uL (ref 150–450)
RBC: 4.55 x10E6/uL (ref 4.14–5.80)
RDW: 13.2 % (ref 11.6–15.4)
WBC: 7 10*3/uL (ref 3.4–10.8)

## 2023-06-26 LAB — BASIC METABOLIC PANEL
BUN/Creatinine Ratio: 19 (ref 10–24)
BUN: 30 mg/dL — ABNORMAL HIGH (ref 8–27)
CO2: 20 mmol/L (ref 20–29)
Calcium: 9.3 mg/dL (ref 8.6–10.2)
Chloride: 97 mmol/L (ref 96–106)
Creatinine, Ser: 1.6 mg/dL — ABNORMAL HIGH (ref 0.76–1.27)
Glucose: 266 mg/dL — ABNORMAL HIGH (ref 70–99)
Potassium: 4.7 mmol/L (ref 3.5–5.2)
Sodium: 133 mmol/L — ABNORMAL LOW (ref 134–144)
eGFR: 45 mL/min/{1.73_m2} — ABNORMAL LOW (ref >59)

## 2023-07-02 ENCOUNTER — Ambulatory Visit (HOSPITAL_COMMUNITY)
Admission: RE | Admit: 2023-07-02 | Discharge: 2023-07-02 | Disposition: A | Discharge status: Home / Self Care | Payer: Medicare Other | Source: Ambulatory Visit | Attending: Cardiovascular Disease | Admitting: Cardiovascular Disease

## 2023-07-02 DIAGNOSIS — I712 Thoracic aortic aneurysm, without rupture, unspecified: Secondary | ICD-10-CM | POA: Insufficient documentation

## 2023-07-02 MED ORDER — IOHEXOL 350 MG/ML SOLN
80.0000 mL | Freq: Once | INTRAVENOUS | Status: AC | PRN
  Administered 2023-07-02: 80 mL via INTRAVENOUS

## 2023-07-08 ENCOUNTER — Telehealth: Payer: Self-pay

## 2023-07-08 DIAGNOSIS — I712 Thoracic aortic aneurysm, without rupture, unspecified: Secondary | ICD-10-CM

## 2023-07-08 NOTE — Telephone Encounter (Signed)
-----   Message from Tonny Bollman, MD sent at 07/07/2023  7:43 AM EDT ----- Maximal aortic diameter 4.3 cm. Should have repeat scan in 12 months. Otherwise continue current management.

## 2023-07-08 NOTE — Telephone Encounter (Signed)
Left detailed message per DPR and repeat CTA order placed at this time to be completed next year.

## 2023-07-14 ENCOUNTER — Other Ambulatory Visit: Payer: Self-pay | Admitting: Cardiovascular Disease

## 2023-07-15 NOTE — Telephone Encounter (Signed)
Prescription refill request for Eliquis received. Indication:afib Last office visit:4/24 Scr:1.6  6/24 Age: 77 Weight:76.2  kg  Prescription refilled

## 2023-08-05 ENCOUNTER — Telehealth: Payer: Self-pay | Admitting: Cardiovascular Disease

## 2023-08-05 MED ORDER — DILTIAZEM HCL ER COATED BEADS 120 MG PO CP24: 120.0000 mg | ORAL_CAPSULE | Freq: Every day | ORAL | 2 refills | Status: DC

## 2023-08-05 NOTE — Telephone Encounter (Signed)
*  STAT* If patient is at the pharmacy, call can be transferred to refill team.   1. Which medications need to be refilled? (please list name of each medication and dose if known)   diltiazem (CARDIZEM CD) 120 MG 24 hr capsule     2. Would you like to learn more about the convenience, safety, & potential cost savings by using the [facility]? No   3. Are you open to using the [facility] (Type [facility].)No   4. Which pharmacy/location (including street and city if local pharmacy) is medication to be sent to?[id number] - [place], [place] - [address]    5. Do they need a 30 day or 90 day supply? 90 day

## 2023-08-05 NOTE — Telephone Encounter (Signed)
Pt's medication was sent to pt's pharmacy as requested. Confirmation received.  °

## 2023-08-30 ENCOUNTER — Other Ambulatory Visit: Payer: Self-pay | Admitting: Cardiovascular Disease

## 2023-10-28 ENCOUNTER — Telehealth: Payer: Self-pay | Admitting: Cardiovascular Disease

## 2023-10-28 NOTE — Telephone Encounter (Signed)
Patient is scheduled for single tooth extraction with no grafting or extensive manipulation of tissues.  He is currently on Eliquis and was granted clearance to proceed with single tooth dental extraction today with no interruption to anticoagulation.  Robin Searing, NP

## 2023-10-28 NOTE — Telephone Encounter (Signed)
I will fax notes to requesting office to see notes from pre op APP Robin Searing, NP.

## 2023-10-28 NOTE — Telephone Encounter (Signed)
   Pre-operative Risk Assessment    Patient Name: SELIM TORUNO  DOB: [birth year] MRN: [id number]     Request for Surgical Clearance    Procedure:  Dental Extraction - Amount of Teeth to be Pulled:  2  Date of Surgery:  Clearance 10/28/23                                 Surgeon:  Dr. Eda Keys Surgeon's Group or Practice Name:  [facility] Phone number:  [phone]  Fax number:     Type of Clearance Requested:   - Pharmacy:  Hold Apixaban (Eliquis)     Type of Anesthesia:  Local    Additional requests/questions:   Caller Baird Lyons) stated patient is in the dental chair now and needs to have a two teeth extracted and restorative work.  Caller wants to know if they can hold patient's blood thinner.  Signed, Annetta Maw   10/28/2023, 11:21 AM

## 2023-10-28 NOTE — Telephone Encounter (Signed)
I called the [facility] and obtained fax# [phone]. I will fax notes.

## 2024-02-12 ENCOUNTER — Other Ambulatory Visit: Payer: Self-pay

## 2024-02-12 DIAGNOSIS — I714 Abdominal aortic aneurysm, without rupture, unspecified: Secondary | ICD-10-CM

## 2024-02-20 ENCOUNTER — Telehealth: Payer: Self-pay | Admitting: Cardiovascular Disease

## 2024-02-20 DIAGNOSIS — I480 Paroxysmal atrial fibrillation: Secondary | ICD-10-CM

## 2024-02-20 MED ORDER — APIXABAN 5 MG PO TABS: 5.0000 mg | ORAL_TABLET | Freq: Two times a day (BID) | ORAL | 1 refills | Status: DC

## 2024-02-20 NOTE — Telephone Encounter (Signed)
*  STAT* If patient is at the pharmacy, call can be transferred to refill team.   1. Which medications need to be refilled? (please list name of each medication and dose if known) apixaban (ELIQUIS) 5 MG TABS tablet  2. Which pharmacy/location (including street and city if local pharmacy) is medication to be sent to? [id number] - [place], [place] - [address]  3. Do they need a 30 day or 90 day supply? 90  

## 2024-02-20 NOTE — Telephone Encounter (Signed)
 Prescription refill request for Eliquis received. Indication: Afib  Last office visit: 04/18/23 Excell Seltzer)  Scr: 1.60 (06/25/23)  Age: 78 Weight: 76.2kg  Appropriate dose. Refill sent.

## 2024-02-20 NOTE — Telephone Encounter (Signed)
 Patient came in this morning wanting to see if he could get a refill on his Eloquis before he run out. Good number is [phone] Thank you!! He stated the Pharmacy said he has to get in touch with his doctor.

## 2024-03-04 ENCOUNTER — Ambulatory Visit (HOSPITAL_BASED_OUTPATIENT_CLINIC_OR_DEPARTMENT_OTHER)
Admission: RE | Admit: 2024-03-04 | Discharge: 2024-03-04 | Disposition: A | Discharge status: Home / Self Care | Payer: Medicare Other | Source: Ambulatory Visit | Attending: Vascular Surgery | Admitting: Vascular Surgery

## 2024-03-04 DIAGNOSIS — I714 Abdominal aortic aneurysm, without rupture, unspecified: Secondary | ICD-10-CM | POA: Insufficient documentation

## 2024-03-04 LAB — POCT I-STAT CREATININE: Creatinine, Ser: 1.6 mg/dL — ABNORMAL HIGH (ref 0.61–1.24)

## 2024-03-04 MED ORDER — IOHEXOL 350 MG/ML SOLN
100.0000 mL | Freq: Once | INTRAVENOUS | Status: AC | PRN
  Administered 2024-03-04: 75 mL via INTRAVENOUS

## 2024-03-11 ENCOUNTER — Ambulatory Visit (INDEPENDENT_AMBULATORY_CARE_PROVIDER_SITE_OTHER): Payer: Medicare Other | Admitting: Vascular Surgery

## 2024-03-11 ENCOUNTER — Encounter: Payer: Self-pay | Admitting: Vascular Surgery

## 2024-03-11 VITALS — BP 103/69 | HR 68 | Temp 98.4°F | Ht 70.0 in | Wt 155.0 lb

## 2024-03-11 DIAGNOSIS — I714 Abdominal aortic aneurysm, without rupture, unspecified: Secondary | ICD-10-CM | POA: Diagnosis not present

## 2024-03-11 NOTE — Progress Notes (Signed)
 Patient ID: Frederick Miles, male   DOB: [birth year], 78 y.o.   MRN: [id number]  Reason for Consult: Follow-up   Referred by Creola Corn, MD  Subjective:     HPI:  Frederick Miles is a 78 y.o. male well-known to me for history of saccular abdominal aortic aneurysm.  His father also had abdominal aortic aneurysm which he had fixed.  Patient continues to be very active without abdominal or back pain and he has been active fishing and leg did not.  He denies any stroke, TIA or amaurosis and walks without limitation.  He continues on Eliquis and a statin.  He is a former smoker but quit many years ago.  Past Medical History:  Diagnosis Date   AAA (abdominal aortic aneurysm) without rupture (HCC) 04/28/2017   Aorta US 1/18: 4.2 cm AAA   Bladder cancer (HCC) 2006   Cervicalgia    Coronary atherosclerosis of native coronary artery    stent x1   Diabetes mellitus without complication (HCC)    typ2   Esophageal reflux    Esophageal stricture    Heart murmur    slight   History of cardiovascular stress test    Lexiscan Myoview 7/16: EF 66%, diaphragmatic attenuation, no ischemia, low risk   History of kidney stones    Hx of adenomatous colonic polyps    Hyperlipidemia    Hypertension    Myocardial infarct [facility])    2007   Unspecified hypothyroidism    Family History  Problem Relation Age of Onset   Heart disease Father    Heart attack Father    Heart disease Mother    Heart attack Paternal Grandfather    Heart attack Paternal Uncle    Colon cancer Neg Hx    Stroke Neg Hx    Diabetes Neg Hx    Stomach cancer Neg Hx    Rectal cancer Neg Hx    Past Surgical History:  Procedure Laterality Date   BIOPSY  12/07/2021   Procedure: BIOPSY;  Surgeon: Rachael Fee, MD;  Location: [facility];  Service: Endoscopy;;   COLONOSCOPY     CORONARY ANGIOPLASTY WITH STENT PLACEMENT  2007   CYSTOSCOPY/RETROGRADE/URETEROSCOPY/STONE EXTRACTION WITH BASKET  08/08/2012   Procedure:  CYSTOSCOPY/RETROGRADE/URETEROSCOPY/STONE EXTRACTION WITH BASKET;  Surgeon: Anner Crete, MD;  Location: [facility];  Service: Urology;  Laterality: Left;  Left Ureteroscopy with Stone Extraction   ESOPHAGOGASTRODUODENOSCOPY (EGD) WITH PROPOFOL N/A 12/07/2021   Procedure: ESOPHAGOGASTRODUODENOSCOPY (EGD) WITH PROPOFOL;  Surgeon: Rachael Fee, MD;  Location: [facility];  Service: Endoscopy;  Laterality: N/A;   EUS N/A 12/07/2021   Procedure: UPPER ENDOSCOPIC ULTRASOUND (EUS) RADIAL;  Surgeon: Rachael Fee, MD;  Location: [facility];  Service: Endoscopy;  Laterality: N/A;   EUS N/A 12/07/2021   Procedure: UPPER ENDOSCOPIC ULTRASOUND (EUS) LINEAR;  Surgeon: Rachael Fee, MD;  Location: [facility];  Service: Endoscopy;  Laterality: N/A;   FINE NEEDLE ASPIRATION N/A 12/07/2021   Procedure: FINE NEEDLE ASPIRATION (FNA) LINEAR;  Surgeon: Rachael Fee, MD;  Location: [facility];  Service: Endoscopy;  Laterality: N/A;   SKIN TAG REMOVAL  07/09/06   right anterior thigh   TONSILLECTOMY AND ADENOIDECTOMY     TRANSURETHRAL RESECTION OF BLADDER  07/09/06   tumor 2-5 cm   URETEROSCOPY WITH HOLMIUM LASER LITHOTRIPSY Left 01/06/2019   Procedure: LEFT URETEROSCOPY left retrograde pylegram basket extraction stone bladder biopsy fulgeration;  Surgeon: Bjorn Pippin, MD;  Location: [facility];  Service: Urology;  Laterality: Left;    Short Social History:  Social History   Tobacco Use   Smoking status: Former    Current packs/day: 0.00    Types: Cigarettes    Quit date: 08/05/1979    Years since quitting: 44.6   Smokeless tobacco: Never  Substance Use Topics   Alcohol use: Yes    Alcohol/week: 5.0 standard drinks of alcohol    Types: 4 Glasses of wine, 1 Cans of beer per week    Allergies  Allergen Reactions   Quinolones     Other Reaction(s): Unknown    Current Outpatient Medications  Medication Sig Dispense Refill   apixaban (ELIQUIS) 5 MG TABS tablet Take 1 tablet (5 mg total) by mouth 2  (two) times daily. 180 tablet 1   atorvastatin (LIPITOR) 40 MG tablet Take 40 mg by mouth daily.     Continuous Blood Gluc Sensor (FREESTYLE LIBRE 2 SENSOR) MISC CHANGE EVERY 14 DAYS TO MONITOR BLOOD GLUCOSE CONTINUOUSLY     diltiazem (CARDIZEM CD) 120 MG 24 hr capsule Take 1 capsule (120 mg total) by mouth daily. 90 capsule 2   empagliflozin (JARDIANCE) 10 MG TABS tablet Take 10 mg by mouth daily.      hydrochlorothiazide (HYDRODIURIL) 25 MG tablet TAKE 1 TABLET (25 MG TOTAL) BY MOUTH DAILY. (Patient taking differently: Take 12.5 mg by mouth daily.) 90 tablet 3   insulin glargine (LANTUS SOLOSTAR) 100 UNIT/ML Solostar Pen Inject 24 Units into the skin at bedtime.     insulin lispro (HUMALOG) 100 UNIT/ML injection Inject 12 Units into the skin 3 (three) times daily with meals.     levothyroxine (SYNTHROID) 100 MCG tablet Take 100 mcg by mouth daily.     lisinopril (ZESTRIL) 20 MG tablet TAKE 1 TABLET BY MOUTH EVERY DAY 90 tablet 0   Magnesium 500 MG CAPS Take 500 mg by mouth daily.     metFORMIN (GLUCOPHAGE) 500 MG tablet Take 500 mg by mouth daily.     nitroGLYCERIN (NITROSTAT) 0.4 MG SL tablet Place 0.4 mg under the tongue every 5 (five) minutes as needed for chest pain.     omeprazole (PRILOSEC) 20 MG capsule Take 20 mg by mouth daily.     No current facility-administered medications for this visit.    Review of Systems  Constitutional:  Constitutional negative. HENT: HENT negative.  Eyes: Eyes negative.  Respiratory: Respiratory negative.  Cardiovascular: Cardiovascular negative.  GI: Gastrointestinal negative.  Musculoskeletal: Musculoskeletal negative.  Skin: Skin negative.  Neurological: Neurological negative. Hematologic: Hematologic/lymphatic negative.  Psychiatric: Psychiatric negative.        Objective:  Objective   Vitals:   03/11/24 0816  BP: 103/69  Pulse: 68  Temp: 98.4 F (36.9 C)  SpO2: 98%  Weight: 155 lb (70.3 kg)  Height: 5\' 10"  (1.778 m)   Body mass  index is 22.24 kg/m.  Physical Exam HENT:     Head: Normocephalic.     Mouth/Throat:     Mouth: Mucous membranes are moist.  Eyes:     Pupils: Pupils are equal, round, and reactive to light.  Cardiovascular:     Pulses:          Popliteal pulses are 2+ on the right side and 2+ on the left side.       Posterior tibial pulses are 2+ on the right side and 2+ on the left side.  Pulmonary:     Effort: Pulmonary effort is normal.  Abdominal:     General: Abdomen  is flat.  Musculoskeletal:     Right lower leg: No edema.     Left lower leg: No edema.  Skin:    General: Skin is warm.     Capillary Refill: Capillary refill takes less than 2 seconds.  Neurological:     General: No focal deficit present.     Mental Status: He is alert.  Psychiatric:        Mood and Affect: Mood normal.        Thought Content: Thought content normal.        Judgment: Judgment normal.     Data: CT reviewed with patient.  Formal read pending     Assessment/Plan:     78 year old male with known history of abdominal aortic saccular aneurysm at the infrarenal and distal aortic position extending into the bilateral common arteries.  CT scan does not demonstrate any appreciable difference on my review with the patient today.  As such we will plan to follow-up in 2 years with repeat duplex.  We discussed the signs and symptoms of rupture and he demonstrates good understanding.     Maeola Harman MD [facility]

## 2024-05-04 ENCOUNTER — Other Ambulatory Visit: Payer: Self-pay | Admitting: Cardiovascular Disease

## 2024-05-05 ENCOUNTER — Other Ambulatory Visit: Payer: Self-pay | Admitting: Cardiovascular Disease

## 2024-06-05 ENCOUNTER — Other Ambulatory Visit: Payer: Self-pay | Admitting: Cardiovascular Disease

## 2024-07-06 ENCOUNTER — Other Ambulatory Visit: Payer: Self-pay | Admitting: Cardiovascular Disease

## 2024-07-21 ENCOUNTER — Other Ambulatory Visit: Payer: Self-pay | Admitting: Cardiovascular Disease

## 2024-07-22 ENCOUNTER — Other Ambulatory Visit: Payer: Self-pay | Admitting: Cardiovascular Disease

## 2024-08-24 ENCOUNTER — Ambulatory Visit: Admitting: Neurology

## 2024-08-24 ENCOUNTER — Encounter: Payer: Self-pay | Admitting: Neurology

## 2024-08-24 VITALS — BP 113/72 | HR 61 | Ht 70.0 in | Wt 154.0 lb

## 2024-08-24 DIAGNOSIS — R413 Other amnesia: Secondary | ICD-10-CM | POA: Diagnosis not present

## 2024-08-24 NOTE — Patient Instructions (Addendum)
 Will obtain B12 level, last TSH was 0.34  Head CT, will contact you to go over the results Continue current medications  Continue to follow up with Dr. Onita and return if worse    There are well-accepted and sensible ways to reduce risk for Alzheimers disease and other degenerative brain disorders .  Exercise Daily Walk A daily 20 minute walk should be part of your routine. Disease related apathy can be a significant roadblock to exercise and the only way to overcome this is to make it a daily routine and perhaps have a reward at the end (something your loved one loves to eat or drink perhaps) or a personal trainer coming to the home can also be very useful. Most importantly, the patient is much more likely to exercise if the caregiver / spouse does it with him/her. In general a structured, repetitive schedule is best.  General Health: Any diseases which effect your body will effect your brain such as a pneumonia, urinary infection, blood clot, heart attack or stroke. Keep contact with your primary care doctor for regular follow ups.  Sleep. A good nights sleep is healthy for the brain. Seven hours is recommended. If you have insomnia or poor sleep habits we can give you some instructions. If you have sleep apnea wear your mask.  Diet: Eating a heart healthy diet is also a good idea; fish and poultry instead of red meat, nuts (mostly non-peanuts), vegetables, fruits, olive oil or canola oil (instead of butter), minimal salt (use other spices to flavor foods), whole grain rice, bread, cereal and pasta and wine in moderation.Research is now showing that the MIND diet, which is a combination of The Mediterranean diet and the DASH diet, is beneficial for cognitive processing and longevity. Information about this diet can be found in The MIND Diet, a book by Annitta Feeling, MS, RDN, and online at [employer]  Finances, Power of [address] and Advance Directives: You should  consider putting legal safeguards in place with regard to financial and medical decision making. While the spouse always has power of attorney for medical and financial issues in the absence of any form, you should consider what you want in case the spouse / caregiver is no longer around or capable of making decisions.

## 2024-08-24 NOTE — Progress Notes (Signed)
 [facility]  PATIENT: Frederick Miles DOB: [birth year]  REQUESTING CLINICIAN: Onita Rush, MD HISTORY FROM: Patient and spouse  REASON FOR VISIT: Memory loss    HISTORICAL  CHIEF COMPLAINT:  Chief Complaint  Patient presents with   New Patient (Initial Visit)    RM    HISTORY OF PRESENT ILLNESS:  Discussed the use of AI scribe software for clinical note transcription with the patient, who gave verbal consent to proceed.  History of Present Illness   Frederick Miles is a 78 year old male with history of AAA, hypertension, Hyperlipidemia, heart disease, diabetes and hypothyroidism who presents with memory concerns and episodes of confusion. He is accompanied by his wife. He was referred by Dr. Rush Onita for evaluation of memory issues.  He has been experiencing memory issues and episodes of confusion, particularly when his blood sugar levels are high. These symptoms have become more noticeable over the past year. He feels confused when his sugar is high, but states that once his sugar levels normalize, his clarity returns. His medications were recently changed and since then, he has been doing better. He has recently retired and was previously known for having a strong memory.  He has a history of diabetes and mentions that his medication has been adjusted recently. He experiences dizziness upon standing after sitting for a while, which has led to near falls. He recalls an incident where he almost fell but was caught by a friend.  He also reports that some of his blood pressure medications were reduced.  There is no significant impact on his ability to perform daily activities such as reading, writing, and managing household tasks.  He has a family history of longevity, with his father living to 63 and his grandmother to 30. There is no known family history of dementia. He is currently taking Synthroid for thyroid issues, which he has been on for many years, and  he takes it in the morning along with other medications.  He acknowledges occasional forgetfulness, but reports no difficulty with daily activities such as cooking, cleaning, or handling bills. No recent CT or MRI scans of the brain. He is currently experiencing hip pain, which limits his exercise, but he plans to resume walking once his hip improves.        TBI:  No past history of TBI Stroke:  no past history of stroke Seizures:  no past history of seizures Sleep:  no history of sleep apnea.  Mood:  patient denies anxiety and depression Family history of Dementia:  Denies  Functional status: independent in all ADLs and IADLs Patient lives with spouse. Cooking: no issues Cleaning: no issues  Shopping:  no issues  Bathing: no issues Toileting: no issues  Driving: no issues, denies any recent accident  Bills: no issues  Medications: no issues Ever left the stove on by accident?: denies Forget how to use items around the house?: denies Getting lost going to familiar places?: denies Forgetting loved ones names?: denies Word finding difficulty? denies Sleep: good   OTHER MEDICAL CONDITIONS: Diabetes, hypertension, hyperlipidemia, hypothyroidism, AAA, CAD   REVIEW OF SYSTEMS: Full 14 system review of systems performed and negative with exception of: As noted in the HPI   ALLERGIES: Allergies  Allergen Reactions   Quinolones     Other Reaction(s): Unknown    HOME MEDICATIONS: Outpatient Medications Prior to Visit  Medication Sig Dispense Refill   apixaban  (ELIQUIS ) 5 MG TABS tablet Take 1 tablet (5 mg  total) by mouth 2 (two) times daily. 180 tablet 1   atorvastatin (LIPITOR) 40 MG tablet Take 40 mg by mouth daily.     Continuous Blood Gluc Sensor (FREESTYLE LIBRE 2 SENSOR) MISC CHANGE EVERY 14 DAYS TO MONITOR BLOOD GLUCOSE CONTINUOUSLY     diltiazem  (CARDIZEM  CD) 120 MG 24 hr capsule TAKE 1 CAPSULE(120 MG) BY MOUTH DAILY 15 capsule 0   empagliflozin (JARDIANCE) 10 MG TABS  tablet Take 10 mg by mouth daily.      hydrochlorothiazide  (HYDRODIURIL ) 25 MG tablet TAKE 1 TABLET (25 MG TOTAL) BY MOUTH DAILY. (Patient taking differently: Take 12.5 mg by mouth daily.) 90 tablet 3   insulin  glargine (LANTUS SOLOSTAR) 100 UNIT/ML Solostar Pen Inject 24 Units into the skin at bedtime.     insulin  lispro (HUMALOG) 100 UNIT/ML injection Inject 12 Units into the skin 3 (three) times daily with meals.     levothyroxine (SYNTHROID) 100 MCG tablet Take 100 mcg by mouth daily.     lisinopril  (ZESTRIL ) 20 MG tablet TAKE 1 TABLET BY MOUTH EVERY DAY 90 tablet 0   Magnesium  500 MG CAPS Take 500 mg by mouth daily.     metFORMIN (GLUCOPHAGE) 500 MG tablet Take 500 mg by mouth daily.     nitroGLYCERIN  (NITROSTAT ) 0.4 MG SL tablet Place 0.4 mg under the tongue every 5 (five) minutes as needed for chest pain.     omeprazole (PRILOSEC) 20 MG capsule Take 20 mg by mouth daily.     No facility-administered medications prior to visit.    PAST MEDICAL HISTORY: Past Medical History:  Diagnosis Date   AAA (abdominal aortic aneurysm) without rupture (HCC) 04/28/2017   Aorta US  1/18: 4.2 cm AAA   Bladder cancer (HCC) 2006   Cervicalgia    Coronary atherosclerosis of native coronary artery    stent x1   Diabetes mellitus without complication (HCC)    typ2   Esophageal reflux    Esophageal stricture    Heart murmur    slight   History of cardiovascular stress test    Lexiscan  Myoview  7/16: EF 66%, diaphragmatic attenuation, no ischemia, low risk   History of kidney stones    Hx of adenomatous colonic polyps    Hyperlipidemia    Hypertension    Myocardial infarct [facility])    2007   Unspecified hypothyroidism     PAST SURGICAL HISTORY: Past Surgical History:  Procedure Laterality Date   BIOPSY  12/07/2021   Procedure: BIOPSY;  Surgeon: Teressa Toribio SQUIBB, MD;  Location: [facility];  Service: Endoscopy;;   COLONOSCOPY     CORONARY ANGIOPLASTY WITH STENT PLACEMENT  2007    CYSTOSCOPY/RETROGRADE/URETEROSCOPY/STONE EXTRACTION WITH BASKET  08/08/2012   Procedure: CYSTOSCOPY/RETROGRADE/URETEROSCOPY/STONE EXTRACTION WITH BASKET;  Surgeon: Norleen JINNY Seltzer, MD;  Location: [facility];  Service: Urology;  Laterality: Left;  Left Ureteroscopy with Stone Extraction   ESOPHAGOGASTRODUODENOSCOPY (EGD) WITH PROPOFOL  N/A 12/07/2021   Procedure: ESOPHAGOGASTRODUODENOSCOPY (EGD) WITH PROPOFOL ;  Surgeon: Teressa Toribio SQUIBB, MD;  Location: [facility];  Service: Endoscopy;  Laterality: N/A;   EUS N/A 12/07/2021   Procedure: UPPER ENDOSCOPIC ULTRASOUND (EUS) RADIAL;  Surgeon: Teressa Toribio SQUIBB, MD;  Location: [facility];  Service: Endoscopy;  Laterality: N/A;   EUS N/A 12/07/2021   Procedure: UPPER ENDOSCOPIC ULTRASOUND (EUS) LINEAR;  Surgeon: Teressa Toribio SQUIBB, MD;  Location: [facility];  Service: Endoscopy;  Laterality: N/A;   FINE NEEDLE ASPIRATION N/A 12/07/2021   Procedure: FINE NEEDLE ASPIRATION (FNA) LINEAR;  Surgeon: Teressa Toribio SQUIBB,  MD;  Location: [facility];  Service: Endoscopy;  Laterality: N/A;   SKIN TAG REMOVAL  07/09/06   right anterior thigh   TONSILLECTOMY AND ADENOIDECTOMY     TRANSURETHRAL RESECTION OF BLADDER  07/09/06   tumor 2-5 cm   URETEROSCOPY WITH HOLMIUM LASER LITHOTRIPSY Left 01/06/2019   Procedure: LEFT URETEROSCOPY left retrograde pylegram basket extraction stone bladder biopsy fulgeration;  Surgeon: Watt Rush, MD;  Location: [facility];  Service: Urology;  Laterality: Left;    FAMILY HISTORY: Family History  Problem Relation Age of Onset   Heart disease Father    Heart attack Father    Heart disease Mother    Heart attack Paternal Grandfather    Heart attack Paternal Uncle    Colon cancer Neg Hx    Stroke Neg Hx    Diabetes Neg Hx    Stomach cancer Neg Hx    Rectal cancer Neg Hx     SOCIAL HISTORY: Social History   Socioeconomic History   Marital status: Married    Spouse name: Not on file   Number of children: 2   Years of education: Not on file    Highest education level: Not on file  Occupational History    Employer: [employer]  Tobacco Use   Smoking status: Former    Current packs/day: 0.00    Types: Cigarettes    Quit date: 08/05/1979    Years since quitting: 45.0   Smokeless tobacco: Never  Vaping Use   Vaping status: Never Used  Substance and Sexual Activity   Alcohol use: Yes    Alcohol/week: 5.0 standard drinks of alcohol    Types: 4 Glasses of wine, 1 Cans of beer per week   Drug use: No   Sexual activity: Yes  Other Topics Concern   Not on file  Social History Narrative   Daily caffeine    Social Drivers of Health   Financial Resource Strain: Not on file  Food Insecurity: Not on file  Transportation Needs: Not on file  Physical Activity: Unknown (08/11/2018)   Exercise Vital Sign    Days of Exercise per Week: 4 days    Minutes of Exercise per Session: Not on file  Stress: Not on file  Social Connections: Not on file  Intimate Partner Violence: Not on file    PHYSICAL EXAM  GENERAL EXAM/CONSTITUTIONAL: Vitals:  Vitals:   08/24/24 1300  BP: 113/72  Pulse: 61  SpO2: 97%  Weight: 154 lb (69.9 kg)  Height: 5' 10 (1.778 m)   Body mass index is 22.1 kg/m. Wt Readings from Last 3 Encounters:  08/24/24 154 lb (69.9 kg)  03/11/24 155 lb (70.3 kg)  04/18/23 168 lb (76.2 kg)   Patient is in no distress; well developed, nourished and groomed; neck is supple  MUSCULOSKELETAL: Gait, strength, tone, movements noted in Neurologic exam below  NEUROLOGIC: MENTAL STATUS:      No data to display            08/24/2024    1:08 PM  Montreal Cognitive Assessment   Visuospatial/ Executive (0/5) 0  Naming (0/3) 3  Attention: Read list of digits (0/2) 2  Attention: Read list of letters (0/1) 1  Attention: Serial 7 subtraction starting at 100 (0/3) 1  Language: Repeat phrase (0/2) 1  Language : Fluency (0/1) 1  Abstraction (0/2) 2  Delayed Recall (0/5) 0  Orientation (0/6) 6  Total 17     awake, alert, oriented to person, place  and time recent and remote memory intact Difficulty with attention and concentration language fluent, comprehension intact, naming intact fund of knowledge appropriate  CRANIAL NERVE:  2nd, 3rd, 4th, 6th- visual fields full to confrontation, extraocular muscles intact, no nystagmus 5th - facial sensation symmetric 7th - facial strength symmetric 8th - hearing intact 9th - palate elevates symmetrically, uvula midline 11th - shoulder shrug symmetric 12th - tongue protrusion midline  MOTOR:  normal bulk and tone, full strength in the BUE, BLE  SENSORY:  normal and symmetric to light touch  COORDINATION:  finger-nose-finger, fine finger movements normal  GAIT/STATION:  normal   DIAGNOSTIC DATA (LABS, IMAGING, TESTING) - I reviewed patient records, labs, notes, testing and imaging myself where available.  Lab Results  Component Value Date   WBC 7.0 06/25/2023   HGB 15.2 06/25/2023   HCT 44.5 06/25/2023   MCV 98 (H) 06/25/2023   PLT 207 06/25/2023      Component Value Date/Time   NA 133 (L) 06/25/2023 0925   K 4.7 06/25/2023 0925   CL 97 06/25/2023 0925   CO2 20 06/25/2023 0925   GLUCOSE 266 (H) 06/25/2023 0925   GLUCOSE 85 01/02/2019 0840   BUN 30 (H) 06/25/2023 0925   CREATININE 1.60 (H) 03/04/2024 1625   CALCIUM 9.3 06/25/2023 0925   PROT 6.9 04/17/2010 0825   ALBUMIN 4.4 04/17/2010 0825   AST 27 04/17/2010 0825   ALT 35 04/17/2010 0825   ALKPHOS 102 04/17/2010 0825   BILITOT 2.1 (H) 04/17/2010 0825   GFRNONAA 58 (L) 01/02/2019 0840   GFRAA >60 01/02/2019 0840   Lab Results  Component Value Date   CHOL 123 03/03/2008   HDL 27.6 (L) 03/03/2008   LDLCALC 65 03/03/2008   TRIG 150 (H) 03/03/2008   CHOLHDL 4.5 CALC 03/03/2008   Lab Results  Component Value Date   HGBA1C 6.4 (H) 01/02/2019   No results found for: VITAMINB12 Lab Results  Component Value Date   TSH 1.27 12/10/2007    ASSESSMENT AND  PLAN  78 y.o. 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 His wife reports no significant concerns about his memory. The low score on the cognitive test does not match his clinical presentation. No evidence of dementia at this time. - Order MRI of the brain to rule out stroke or other abnormalities. - Check B12 level as low levels can affect memory and are easily correctable. - Educate on the importance of exercise for brain health.  Type 2 diabetes mellitus Experiences episodes of confusion and dizziness, potentially related to fluctuations in blood glucose levels. Reports memory issues are more pronounced when blood glucose is high. Diabetes management is being adjusted by his primary care provider. Diabetes can cause neuropathy, which may contribute to dizziness. - Emphasize the importance of maintaining stable blood glucose levels to prevent complications.  Hypothyroidism On Synthroid for hypothyroidism but has been taking it with other medications, which can interfere with absorption. - Instruct to take Synthroid on an empty stomach, 30 minutes before other medications or food to ensure proper absorption    1. Memory loss     Patient Instructions  Will obtain B12 level, last TSH was 0.34  Head CT, will contact you to go over the results Continue current medications  Continue to follow up with Dr. Onita and  return if worse    There are well-accepted and sensible ways to reduce risk for Alzheimers disease and other degenerative brain disorders .  Exercise Daily Walk A daily 20 minute walk should be part of your routine. Disease related apathy can be a significant roadblock to exercise and the only way to overcome this is to make it a daily routine and perhaps have a reward at the end (something  your loved one loves to eat or drink perhaps) or a personal trainer coming to the home can also be very useful. Most importantly, the patient is much more likely to exercise if the caregiver / spouse does it with him/her. In general a structured, repetitive schedule is best.  General Health: Any diseases which effect your body will effect your brain such as a pneumonia, urinary infection, blood clot, heart attack or stroke. Keep contact with your primary care doctor for regular follow ups.  Sleep. A good nights sleep is healthy for the brain. Seven hours is recommended. If you have insomnia or poor sleep habits we can give you some instructions. If you have sleep apnea wear your mask.  Diet: Eating a heart healthy diet is also a good idea; fish and poultry instead of red meat, nuts (mostly non-peanuts), vegetables, fruits, olive oil or canola oil (instead of butter), minimal salt (use other spices to flavor foods), whole grain rice, bread, cereal and pasta and wine in moderation.Research is now showing that the MIND diet, which is a combination of The Mediterranean diet and the DASH diet, is beneficial for cognitive processing and longevity. Information about this diet can be found in The MIND Diet, a book by Annitta Feeling, MS, RDN, and online at [employer]  Finances, Power of [address] and Advance Directives: You should consider putting legal safeguards in place with regard to financial and medical decision making. While the spouse always has power of attorney for medical and financial issues in the absence of any form, you should consider what you want in case the spouse / caregiver is no longer around or capable of making decisions.   Orders Placed This Encounter  Procedures   CT HEAD WO CONTRAST ( )   Vitamin B12    No orders of the defined types were placed in this encounter.   Return if symptoms worsen or fail to improve.  I have spent a total of 65  minutes dedicated to this patient today, preparing to see patient, performing a medically appropriate examination and evaluation, ordering tests and/or medications and procedures, and counseling and educating the patient/family/caregiver; independently interpreting result and communicating results to the family/patient/caregiver; and documenting clinical information in the electronic medical record.   Pastor Falling, MD 08/24/2024, 2:20 PM  [facility] [address] [place], [place] [address] [phone]

## 2024-08-25 ENCOUNTER — Ambulatory Visit: Payer: Self-pay | Admitting: Neurology

## 2024-08-25 ENCOUNTER — Telehealth: Payer: Self-pay | Admitting: Neurology

## 2024-08-25 LAB — VITAMIN B12: Vitamin B-12: 339 pg/mL (ref 232–1245)

## 2024-08-25 MED ORDER — VITAMIN B-12 1000 MCG PO TABS: 1000.0000 ug | ORAL_TABLET | Freq: Every day | ORAL | 3 refills | Status: DC

## 2024-08-25 NOTE — Telephone Encounter (Signed)
 No auth required sent to [facility] to schedule. [phone]

## 2024-08-26 ENCOUNTER — Other Ambulatory Visit: Payer: Self-pay | Admitting: Cardiovascular Disease

## 2024-08-26 DIAGNOSIS — I480 Paroxysmal atrial fibrillation: Secondary | ICD-10-CM

## 2024-08-26 NOTE — Telephone Encounter (Signed)
 Prescription refill request for Eliquis  received. Indication:afib Last office visit:needs appt Scr:na Age: 78 Weight:69.9  kg  Prescription refilled

## 2024-08-27 ENCOUNTER — Telehealth: Payer: Self-pay | Admitting: Cardiovascular Disease

## 2024-08-27 DIAGNOSIS — I480 Paroxysmal atrial fibrillation: Secondary | ICD-10-CM

## 2024-08-27 MED ORDER — APIXABAN 5 MG PO TABS: 5.0000 mg | ORAL_TABLET | Freq: Two times a day (BID) | ORAL | 0 refills | Status: DC

## 2024-08-27 NOTE — Telephone Encounter (Signed)
*  STAT* If patient is at the pharmacy, call can be transferred to refill team.   1. Which medications need to be refilled? (please list name of each medication and dose if known)  apixaban  (ELIQUIS ) 5 MG TABS tablet  2. Which pharmacy/location (including street and city if local pharmacy) is medication to be sent to? [id number] - [place], [place] - [address]   3. Do they need a 30 day or 90 day supply?  90 day supply  Patient is completely out of medication.

## 2024-09-03 ENCOUNTER — Other Ambulatory Visit: Payer: Self-pay | Admitting: Cardiovascular Disease

## 2024-09-14 ENCOUNTER — Ambulatory Visit
Admission: RE | Admit: 2024-09-14 | Discharge: 2024-09-14 | Disposition: A | Discharge status: Home / Self Care | Source: Ambulatory Visit | Attending: Neurology | Admitting: Neurology

## 2024-09-14 DIAGNOSIS — R413 Other amnesia: Secondary | ICD-10-CM

## 2024-09-22 NOTE — Progress Notes (Deleted)
 Cardiology Office Note:  .   Date:  09/22/2024  ID:  Frederick Miles, DOB 1946-07-03, MRN [id number] PCP: Onita Rush, MD  [facility] Providers Cardiologist:  Ozell Fell, MD { Click to update primary MD,subspecialty MD or APP then REFRESH:1}   History of Present Illness: Frederick Miles is a 78 y.o. male   with a hx of coronary artery disease, history of paroxysmal atrial fibrillation, infrarenal abdominal aortic aneurysm followed by vascular surgery, hypertension, type 2 diabetes.   She saw Dr. Fell last year and echo ordred for murmur-aortic sclerosis, incidental finding of thoracic aneurysm so CTA ordred and 4.3 cm needs yearly f/u.    ROS: ***  Studies Reviewed: SABRA         Prior CV Studies: {Select studies to display:26339}  Echo 05/2023 IMPRESSIONS     1. Consider f/u gated chest CTA to further size the ascending aorta.   2. Left ventricular ejection fraction, by estimation, is 60 to 65%. The  left ventricle has normal function. The left ventricle has no regional  wall motion abnormalities. Left ventricular diastolic parameters were  normal.   3. Right ventricular systolic function is normal. The right ventricular  size is normal.   4. Left atrial size was moderately dilated.   5. The mitral valve is normal in structure. No evidence of mitral valve  regurgitation. No evidence of mitral stenosis.   6. The aortic valve is tricuspid. There is mild calcification of the  aortic valve. Aortic valve regurgitation is not visualized. Aortic valve  sclerosis is present, with no evidence of aortic valve stenosis.   7. Aortic dilatation noted. There is severe dilatation of the ascending  aorta, measuring 46 mm.   8. The inferior vena cava is normal in size with greater than 50%  respiratory variability, suggesting right atrial pressure of 3 mmHg.   CT angio abd 02/2024 IMPRESSION: VASCULAR   1. 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 2.  Aortic Atherosclerosis (ICD10-I70.0).   CT angio chest 07/2023 NON-VASCULARMPRESSION: 1. 4.3 cm ascending thoracic aortic aneurysm without complicating features. Recommend annual imaging followup by CTA or MRA. This recommendation follows 2010 ACCF/AHA/AATS/ACR/ASA/SCA/SCAI/SIR/STS/SVM Guidelines for the Diagnosis and Management of Patients with Thoracic Aortic Disease. Circulation. 2010; 121: z733-z630 2. Coronary and Aortic Atherosclerosis (ICD10-170.0). 3.  Emphysema (ICD10-J43.9).    Electronically Signed   By: JONETTA Faes M.D.   On: 07/02/2023 16:47   Risk Assessment/Calculations:   {Does this patient have ATRIAL FIBRILLATION?[phone]} No BP recorded.  {Refresh Note OR Click here to enter BP  :1}***       Physical Exam:   VS:  There were no vitals taken for this visit.   Orhtostatics: No data found. Wt Readings from Last 3 Encounters:  08/24/24 154 lb (69.9 kg)  03/11/24 155 lb (70.3 kg)  04/18/23 168 lb (76.2 kg)    GEN: Well nourished, well developed in no acute distress NECK: No JVD; No carotid bruits CARDIAC: ***RRR, no murmurs, rubs, gallops RESPIRATORY:  Clear to auscultation without rales, wheezing or rhonchi  ABDOMEN: Soft, non-tender, non-distended EXTREMITIES:  No edema; No deformity   ASSESSMENT AND PLAN: .    Infrarenal abdominal aortic aneurysm (AAA) without rupture followed by Dr. [name]  Thoracic aortic aneurysm 4.3 cm 07/2023 needs yearly f/u  Essential hypertension   Paroxysmal atrial fibrillation on Eliquis   Coronary artery  disease involving native coronary artery of native heart without angina pectoris   Aortic sclerosis      {Are you ordering a CV Procedure (e.g. stress test, cath, DCCV, TEE, etc)?   Press F2        :[id number]}  Dispo: ***  Signed, Olivia Pavy, PA-C

## 2024-09-29 ENCOUNTER — Other Ambulatory Visit: Payer: Self-pay | Admitting: Cardiovascular Disease

## 2024-09-29 DIAGNOSIS — I480 Paroxysmal atrial fibrillation: Secondary | ICD-10-CM

## 2024-09-29 NOTE — Telephone Encounter (Signed)
 Prescription refill request for Eliquis  received. Indication:afib Last office visit:upcoming Scr:1.60  3/25 Age: 78 Weight:69.9  kg  Prescription refilled

## 2024-10-01 NOTE — Progress Notes (Signed)
 Cardiology Office Note:  .   Date:  10/05/2024  ID:  ZAMAURI NEZ, DOB 01-Apr-1946, MRN [id number] PCP: Onita Rush, MD  [facility] Providers Cardiologist:  Ozell Fell, MD    History of Present Illness: Frederick Miles is a 78 y.o. 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   She saw Dr. Fell last year and echo ordred for murmur-aortic sclerosis, incidental finding of thoracic aneurysm so CTA ordred and 4.3 cm needs yearly f/u.  Patient here for f/u. Denies chest pain, palpitations, dyspnea, edema. Was walking a lot but has had a lot of hip pain that he got an injection for. Going on a pheasant hunt. Last week he walked 40 min/2 miles twice last without any problem.    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 Mild scattered atherosclerotic calcifications.   Celiac: Patent without evidence of aneurysm, dissection, vasculitis or significant stenosis. Again seen is a replaced common hepatic artery arising from the abdominal aorta between the celiac and SMA.   SMA: Patent without evidence of aneurysm, dissection, vasculitis or significant stenosis.   Renals: Single right and dual left renal arteries are  patent without evidence of aneurysm, dissection, vasculitis, fibromuscular dysplasia or significant stenosis.   IMA: Patent without evidence of aneurysm, dissection, vasculitis or significant stenosis.   Inflow: Patent without evidence of aneurysm, dissection, vasculitis or significant stenosis.   Proximal Outflow: Bilateral common femoral and visualized portions of the superficial and profunda femoral arteries are patent without evidence of aneurysm, dissection, vasculitis or significant stenosis.   Veins: No obvious venous abnormality within the limitations of this arterial phase study.   Review of the MIP images confirms the above findings.    Echo 05/2023 IMPRESSIONS     1. Consider f/u gated chest CTA to further size the ascending aorta.   2. Left ventricular ejection fraction, by estimation, is 60 to 65%. The  left ventricle has normal function. The left ventricle has no regional  wall motion abnormalities. Left ventricular diastolic parameters were  normal.   3. Right ventricular systolic function is normal. The right ventricular  size is normal.   4. Left atrial size was moderately dilated.   5. The mitral valve is normal in structure. No evidence of mitral valve  regurgitation. No evidence of mitral stenosis.   6. The aortic valve is tricuspid. There is mild calcification of the  aortic valve. Aortic valve regurgitation is not visualized. Aortic valve  sclerosis is present, with no evidence of aortic valve stenosis.   7. Aortic dilatation noted. There is severe dilatation of the ascending  aorta, measuring 46 mm.   8. The inferior vena cava is normal in size with greater than 50%  respiratory variability,  suggesting right atrial pressure of 3 mmHg.   CT angio abd 02/2024 IMPRESSION: VASCULAR   1. Stable appearance of previously visualized thrombosed saccular aneurysm about the distal abdominal aorta extending to the level of the right common iliac artery. 2.   Aortic Atherosclerosis (ICD10-I70.0).   CT angio chest 07/2023 NON-VASCULARMPRESSION: 1. 4.3 cm ascending thoracic aortic aneurysm without complicating features. Recommend annual imaging followup by CTA or MRA. This recommendation follows 2010 ACCF/AHA/AATS/ACR/ASA/SCA/SCAI/SIR/STS/SVM Guidelines for the Diagnosis and Management of Patients with Thoracic Aortic Disease. Circulation. 2010; 121: z733-z630 2. Coronary and Aortic Atherosclerosis (ICD10-170.0). 3.  Emphysema (ICD10-J43.9).    Electronically Signed   By: JONETTA Faes M.D.   On: 07/02/2023 16:47   Risk Assessment/Calculations:    CHA2DS2-VASc Score = 4   This indicates a 4.8% annual risk of stroke. The patient's score is based upon: CHF History: 0 HTN History: 1 Diabetes History: 0 Stroke History: 0 Vascular Disease History: 1 Age Score: 2 Gender Score: 0            Physical Exam:   VS:  BP 130/70   Pulse (!) 55   Ht 5' 10 (1.778 m)   Wt 157 lb (71.2 kg)   SpO2 97%   BMI 22.53 kg/m    Orhtostatics: No data found. Wt Readings from Last 3 Encounters:  10/05/24 157 lb (71.2 kg)  08/24/24 154 lb (69.9 kg)  03/11/24 155 lb (70.3 kg)    GEN: Thin,in no acute distress NECK: No JVD; No carotid bruits CARDIAC:  RRR, 2/6/systolic murmur RESPIRATORY:  Clear to auscultation without rales, wheezing or rhonchi  ABDOMEN: Soft, non-tender, non-distended EXTREMITIES:  No edema; No deformity   ASSESSMENT AND PLAN: .    Infrarenal abdominal aortic aneurysm (AAA) without rupture followed by Dr. [name]  Thoracic aortic aneurysm 4.3 cm 07/2023 needs yearly f/u-will orderCTA chest, check bmet today  Essential hypertension -well controlled  Paroxysmal atrial fibrillation on Eliquis -no bleeding issues. Labs ok 06/2024  Coronary artery disease involving native coronary artery of native heart without angina pectoris   Aortic sclerosis   DM2 A1C 7.5 -working on diet        Dispo: f/u Dr. Wonda 1  yr.  Signed, Olivia Pavy, PA-C

## 2024-10-05 ENCOUNTER — Ambulatory Visit: Admitting: Physician Assistant

## 2024-10-05 ENCOUNTER — Encounter: Payer: Self-pay | Admitting: Physician Assistant

## 2024-10-05 ENCOUNTER — Ambulatory Visit: Attending: Physician Assistant | Admitting: Physician Assistant

## 2024-10-05 VITALS — BP 130/70 | HR 55 | Ht 70.0 in | Wt 157.0 lb

## 2024-10-05 DIAGNOSIS — I358 Other nonrheumatic aortic valve disorders: Secondary | ICD-10-CM

## 2024-10-05 DIAGNOSIS — I2510 Atherosclerotic heart disease of native coronary artery without angina pectoris: Secondary | ICD-10-CM

## 2024-10-05 DIAGNOSIS — I10 Essential (primary) hypertension: Secondary | ICD-10-CM

## 2024-10-05 DIAGNOSIS — I480 Paroxysmal atrial fibrillation: Secondary | ICD-10-CM | POA: Diagnosis not present

## 2024-10-05 DIAGNOSIS — I7143 Infrarenal abdominal aortic aneurysm, without rupture: Secondary | ICD-10-CM | POA: Diagnosis not present

## 2024-10-05 DIAGNOSIS — I712 Thoracic aortic aneurysm, without rupture, unspecified: Secondary | ICD-10-CM

## 2024-10-05 NOTE — Patient Instructions (Signed)
 Medication Instructions:  No changes   *If you need a refill on your cardiac medications before your next appointment, please call your pharmacy*   Lab Work: BMP  If you have labs (blood work) drawn today and your tests are completely normal, you will receive your results only by: MyChart Message (if you have MyChart) OR A paper copy in the mail If you have any lab test that is abnormal or we need to change your treatment, we will call you to review the results.   Testing/Procedures: SUBJECTIVE:Non-Cardiac CT Angiography (CTA), is a special type of CT scan that uses a computer to produce multi-dimensional views of major blood vessels - aorta. In CT angiography, a contrast material is injected through an IV to help visualize the blood vessels   Follow-Up: At [facility], you and your health needs are our priority.  As part of our continuing mission to provide you with exceptional heart care, we have created designated Provider Care Teams.  These Care Teams include your primary Cardiologist (physician) and Advanced Practice Providers (APPs -  Physician Assistants and Nurse Practitioners) who all work together to provide you with the care you need, when you need it.     Your next appointment:   1 year(s)  The format for your next appointment:   In Person  Provider:   Ozell Fell, MD   Other Instructions

## 2024-10-06 ENCOUNTER — Ambulatory Visit: Payer: Self-pay | Admitting: Physician Assistant

## 2024-10-06 DIAGNOSIS — Z79899 Other long term (current) drug therapy: Secondary | ICD-10-CM

## 2024-10-06 LAB — BASIC METABOLIC PANEL WITH GFR
BUN/Creatinine Ratio: 16 (ref 10–24)
BUN: 21 mg/dL (ref 8–27)
CO2: 21 mmol/L (ref 20–29)
Calcium: 9 mg/dL (ref 8.6–10.2)
Chloride: 99 mmol/L (ref 96–106)
Creatinine, Ser: 1.29 mg/dL — ABNORMAL HIGH (ref 0.76–1.27)
Glucose: 143 mg/dL — ABNORMAL HIGH (ref 70–99)
Potassium: 5.5 mmol/L — ABNORMAL HIGH (ref 3.5–5.2)
Sodium: 137 mmol/L (ref 134–144)
eGFR: 57 mL/min/1.73 — ABNORMAL LOW (ref >59)

## 2024-10-13 ENCOUNTER — Telehealth: Payer: Self-pay | Admitting: Cardiovascular Disease

## 2024-10-13 LAB — BASIC METABOLIC PANEL WITH GFR
BUN/Creatinine Ratio: 15 (ref 10–24)
BUN: 21 mg/dL (ref 8–27)
CO2: 23 mmol/L (ref 20–29)
Calcium: 8.5 mg/dL — ABNORMAL LOW (ref 8.6–10.2)
Chloride: 99 mmol/L (ref 96–106)
Creatinine, Ser: 1.36 mg/dL — ABNORMAL HIGH (ref 0.76–1.27)
Glucose: 257 mg/dL — ABNORMAL HIGH (ref 70–99)
Potassium: 4.4 mmol/L (ref 3.5–5.2)
Sodium: 140 mmol/L (ref 134–144)
eGFR: 54 mL/min/1.73 — ABNORMAL LOW (ref >59)

## 2024-10-13 MED ORDER — HYDROCHLOROTHIAZIDE 25 MG PO TABS: 12.5000 mg | ORAL_TABLET | Freq: Every day | ORAL | 1 refills | Status: DC

## 2024-10-13 NOTE — Telephone Encounter (Signed)
 S/w Rock (dpr) and gave the information below. She verbalized understanding.   Camelia Slade, [facility] 10/13/2024  9:48 AM EDT Back to Top    Spoke to patient and gave him the results to his lab work. We made his follow up appointment with Olivia Pavy for 10-26-24 at 1:00 PM. I sent in a new prescription for the dose change of Hydrochlorothiazide  to the pharmacy. He understood everything and had no questions moving forward.   Olivia CHRISTELLA Pavy, PA-C 10/13/2024  7:40 AM EDT     Potassium normal. Kidney funtion up slightly. Please try to reduce hydrochlorothiazide  25 mg 1/2 daily and keep track of BP's. Please make f/u with me after his CTA for BP check thanks.  Glucose also quite high-needs better control. F/u with PCP. thanks

## 2024-10-13 NOTE — Progress Notes (Signed)
 Cardiology Office Note:  .   Date:  10/26/2024  ID:  Frederick Miles, DOB Mar 31, 1946, MRN [id number] PCP: Onita Rush, MD  [facility] Providers Cardiologist:  Ozell Fell, MD    History of Present Illness: Frederick Miles is a 78 y.o. 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    She saw Dr. Fell last year and echo ordred for murmur-aortic sclerosis, incidental finding of thoracic aneurysm so CTA ordred and 4.3 cm needs yearly f/u.  I saw the patient 10/06/24, labs checked and K high. I repeated labs and K normal kidney function up some. I asked him to reduce hydrochlorothiazide  12.5 mg daily and keep track of BP's but he never did. Glucose also high. CTA: pending. BP readings from home are all stable.     ROS:    Studies Reviewed: SABRA         Prior CV Studies:   CT angio abdomen 02/2024 FINDINGS: VASCULAR   Aorta: Unchanged appearance of previously visualized thrombosed saccular aneurysm of the distal infrarenal abdominal aorta extending to the level of the right common iliac artery. Mild scattered atherosclerotic calcifications.   Celiac: Patent without evidence of aneurysm, dissection, vasculitis or significant stenosis. Again seen is a replaced common hepatic artery arising from the abdominal aorta between the celiac and SMA.   SMA: Patent without evidence of aneurysm, dissection, vasculitis or significant stenosis.   Renals: Single right and dual left renal arteries are patent without evidence of aneurysm, dissection, vasculitis, fibromuscular dysplasia or significant stenosis.   IMA: Patent without evidence of aneurysm, dissection, vasculitis or significant stenosis.   Inflow: Patent without evidence of aneurysm, dissection, vasculitis or significant stenosis.   Proximal Outflow: Bilateral  common femoral and visualized portions of the superficial and profunda femoral arteries are patent without evidence of aneurysm, dissection, vasculitis or significant stenosis.   Veins: No obvious venous abnormality within the limitations of this arterial phase study.   Review of the MIP images confirms the above findings.     Echo 05/2023 IMPRESSIONS     1. Consider f/u gated chest CTA to further size the ascending aorta.   2. Left ventricular ejection fraction, by estimation, is 60 to 65%. The  left ventricle has normal function. The left ventricle has no regional  wall motion abnormalities. Left ventricular diastolic parameters were  normal.   3. Right ventricular systolic function is normal. The right ventricular  size is normal.   4. Left atrial size was moderately dilated.   5. The mitral valve is normal in structure. No evidence of mitral valve  regurgitation. No evidence of mitral stenosis.   6. The aortic valve is tricuspid. There is mild calcification of the  aortic valve. Aortic valve regurgitation is not visualized. Aortic valve  sclerosis is present, with no evidence of aortic valve stenosis.   7. Aortic dilatation noted. There is severe dilatation of the ascending  aorta, measuring 46 mm.   8. The inferior vena cava is normal in size with greater than 50%  respiratory variability, suggesting right atrial pressure of 3 mmHg.    CT angio abd 02/2024 IMPRESSION: VASCULAR   1. Stable appearance of previously visualized thrombosed saccular aneurysm about the distal abdominal aorta extending to the level of the right common iliac artery. 2.  Aortic Atherosclerosis (ICD10-I70.0).   CT angio chest  07/2023 NON-VASCULARMPRESSION: 1. 4.3 cm ascending thoracic aortic aneurysm without complicating features. Recommend annual imaging followup by CTA or MRA. This recommendation follows 2010 ACCF/AHA/AATS/ACR/ASA/SCA/SCAI/SIR/STS/SVM Guidelines for the Diagnosis and  Management of Patients with Thoracic Aortic Disease. Circulation. 2010; 121: [id number] 2. Coronary and Aortic Atherosclerosis (ICD10-170.0). 3.  Emphysema (ICD10-J43.9).     Electronically Signed   By: JONETTA Faes M.D.   On: 07/02/2023 16:47  Risk Assessment/Calculations:             Physical Exam:   VS:  BP 134/82 (BP Location: Left Arm, Patient Position: Sitting)   Pulse (!) 57   Ht 5' 10 (1.778 m)   Wt 156 lb 6.4 oz (70.9 kg)   SpO2 100%   BMI 22.44 kg/m    Orhtostatics: No data found. Wt Readings from Last 3 Encounters:  10/26/24 156 lb 6.4 oz (70.9 kg)  10/05/24 157 lb (71.2 kg)  08/24/24 154 lb (69.9 kg)    GEN: Well nourished, well developed in no acute distress NECK: No JVD; No carotid bruits CARDIAC:  RRR, no murmurs, rubs, gallops RESPIRATORY:  Clear to auscultation without rales, wheezing or rhonchi  ABDOMEN: Soft, non-tender, non-distended EXTREMITIES:  No edema; No deformity   ASSESSMENT AND PLAN: .    Infrarenal abdominal aortic aneurysm (AAA) without rupture followed by Dr. [name]   Thoracic aortic aneurysm 4.3 cm 07/2023 needs yearly f/u- CTA chest, pending   Essential hypertension -well controlled-some readings from home are low. He never decreased his hydrochlorothiazide  12.5 mg daily. Will do now. Repeat bmet in 2 weeks. Last Crt 1.36   Paroxysmal atrial fibrillation on Eliquis -no bleeding issues. Labs ok 06/2024   Coronary artery disease involving native coronary artery of native heart without angina pectoris    Aortic sclerosis    DM2 A1C 7.5 -working on diet        Dispo: f/u Dr. Wonda 6 months.  Signed, Olivia Pavy, PA-C

## 2024-10-13 NOTE — Telephone Encounter (Signed)
 Wife calling in about the results from patients labs. Please advise

## 2024-10-23 ENCOUNTER — Ambulatory Visit (HOSPITAL_COMMUNITY)
Admission: RE | Admit: 2024-10-23 | Discharge: 2024-10-23 | Disposition: A | Discharge status: Home / Self Care | Source: Ambulatory Visit | Attending: Physician Assistant | Admitting: Physician Assistant

## 2024-10-23 DIAGNOSIS — I712 Thoracic aortic aneurysm, without rupture, unspecified: Secondary | ICD-10-CM | POA: Diagnosis present

## 2024-10-23 MED ORDER — IOHEXOL 350 MG/ML SOLN
75.0000 mL | Freq: Once | INTRAVENOUS | Status: AC | PRN
  Administered 2024-10-23: 75 mL via INTRAVENOUS

## 2024-10-26 ENCOUNTER — Encounter: Payer: Self-pay | Admitting: Physician Assistant

## 2024-10-26 ENCOUNTER — Ambulatory Visit: Attending: Physician Assistant | Admitting: Physician Assistant

## 2024-10-26 VITALS — BP 134/82 | HR 57 | Ht 70.0 in | Wt 156.4 lb

## 2024-10-26 DIAGNOSIS — E1159 Type 2 diabetes mellitus with other circulatory complications: Secondary | ICD-10-CM

## 2024-10-26 DIAGNOSIS — I2510 Atherosclerotic heart disease of native coronary artery without angina pectoris: Secondary | ICD-10-CM

## 2024-10-26 DIAGNOSIS — I480 Paroxysmal atrial fibrillation: Secondary | ICD-10-CM

## 2024-10-26 DIAGNOSIS — I10 Essential (primary) hypertension: Secondary | ICD-10-CM | POA: Diagnosis not present

## 2024-10-26 DIAGNOSIS — I7143 Infrarenal abdominal aortic aneurysm, without rupture: Secondary | ICD-10-CM | POA: Diagnosis not present

## 2024-10-26 DIAGNOSIS — Z79899 Other long term (current) drug therapy: Secondary | ICD-10-CM

## 2024-10-26 DIAGNOSIS — Z794 Long term (current) use of insulin: Secondary | ICD-10-CM

## 2024-10-26 DIAGNOSIS — I712 Thoracic aortic aneurysm, without rupture, unspecified: Secondary | ICD-10-CM | POA: Diagnosis not present

## 2024-10-26 DIAGNOSIS — I358 Other nonrheumatic aortic valve disorders: Secondary | ICD-10-CM

## 2024-10-26 MED ORDER — HYDROCHLOROTHIAZIDE 12.5 MG PO CAPS: 12.5000 mg | ORAL_CAPSULE | Freq: Every day | ORAL | 3 refills | Status: DC

## 2024-10-26 NOTE — Patient Instructions (Signed)
 Medication Instructions:  Your physician has recommended you make the following change in your medication:   REDUCE THE HYDROCHLOROTHIAZIDE  TO 1/2 TABLET DAILY  A NEW PRESCRIPTION HAS BEEN SENT TO [facility].  *If you need a refill on your cardiac medications before your next appointment, please call your pharmacy*  Lab Work: 2 WEEKS, COME BACK TO THE LAB FOR:  BMET  If you have labs (blood work) drawn today and your tests are completely normal, you will receive your results only by: MyChart Message (if you have MyChart) OR A paper copy in the mail If you have any lab test that is abnormal or we need to change your treatment, we will call you to review the results.  Testing/Procedures: None ordered  Follow-Up: At [facility], you and your health needs are our priority.  As part of our continuing mission to provide you with exceptional heart care, our providers are all part of one team.  This team includes your primary Cardiologist (physician) and Advanced Practice Providers or APPs (Physician Assistants and Nurse Practitioners) who all work together to provide you with the care you need, when you need it.  Your next appointment:   6 month(s)  Provider:   Ozell Fell, MD    We recommend signing up for the patient portal called MyChart.  Sign up information is provided on this After Visit Summary.  MyChart is used to connect with patients for Virtual Visits (Telemedicine).  Patients are able to view lab/test results, encounter notes, upcoming appointments, etc.  Non-urgent messages can be sent to your provider as well.   To learn more about what you can do with MyChart, go to [email or web address].   Other Instructions

## 2024-10-27 ENCOUNTER — Ambulatory Visit: Admitting: Physician Assistant

## 2024-11-05 ENCOUNTER — Emergency Department (HOSPITAL_COMMUNITY)

## 2024-11-05 ENCOUNTER — Other Ambulatory Visit: Payer: Self-pay

## 2024-11-05 ENCOUNTER — Encounter (HOSPITAL_COMMUNITY): Payer: Self-pay

## 2024-11-05 ENCOUNTER — Inpatient Hospital Stay (HOSPITAL_COMMUNITY)
Admission: EM | Admit: 2024-11-05 | Discharge: 2024-11-12 | DRG: 233 | Disposition: A | Discharge status: Home / Self Care | Attending: Thoracic Surgery (Cardiothoracic Vascular Surgery) | Admitting: Thoracic Surgery (Cardiothoracic Vascular Surgery)

## 2024-11-05 DIAGNOSIS — J432 Centrilobular emphysema: Secondary | ICD-10-CM | POA: Diagnosis present

## 2024-11-05 DIAGNOSIS — I10 Essential (primary) hypertension: Secondary | ICD-10-CM | POA: Diagnosis not present

## 2024-11-05 DIAGNOSIS — I480 Paroxysmal atrial fibrillation: Secondary | ICD-10-CM | POA: Diagnosis not present

## 2024-11-05 DIAGNOSIS — E1159 Type 2 diabetes mellitus with other circulatory complications: Secondary | ICD-10-CM | POA: Diagnosis not present

## 2024-11-05 DIAGNOSIS — E785 Hyperlipidemia, unspecified: Secondary | ICD-10-CM | POA: Diagnosis present

## 2024-11-05 DIAGNOSIS — E039 Hypothyroidism, unspecified: Secondary | ICD-10-CM | POA: Diagnosis present

## 2024-11-05 DIAGNOSIS — I739 Peripheral vascular disease, unspecified: Secondary | ICD-10-CM | POA: Diagnosis present

## 2024-11-05 DIAGNOSIS — J9811 Atelectasis: Secondary | ICD-10-CM | POA: Diagnosis present

## 2024-11-05 DIAGNOSIS — I482 Chronic atrial fibrillation, unspecified: Secondary | ICD-10-CM | POA: Diagnosis present

## 2024-11-05 DIAGNOSIS — R079 Chest pain, unspecified: Principal | ICD-10-CM

## 2024-11-05 DIAGNOSIS — J439 Emphysema, unspecified: Secondary | ICD-10-CM | POA: Insufficient documentation

## 2024-11-05 DIAGNOSIS — I214 Non-ST elevation (NSTEMI) myocardial infarction: Principal | ICD-10-CM | POA: Diagnosis present

## 2024-11-05 DIAGNOSIS — Z87442 Personal history of urinary calculi: Secondary | ICD-10-CM

## 2024-11-05 DIAGNOSIS — Z7982 Long term (current) use of aspirin: Secondary | ICD-10-CM

## 2024-11-05 DIAGNOSIS — Z79890 Hormone replacement therapy (postmenopausal): Secondary | ICD-10-CM

## 2024-11-05 DIAGNOSIS — T82855A Stenosis of coronary artery stent, initial encounter: Principal | ICD-10-CM | POA: Diagnosis present

## 2024-11-05 DIAGNOSIS — N1831 Chronic kidney disease, stage 3a: Secondary | ICD-10-CM | POA: Diagnosis present

## 2024-11-05 DIAGNOSIS — N179 Acute kidney failure, unspecified: Secondary | ICD-10-CM | POA: Diagnosis present

## 2024-11-05 DIAGNOSIS — I25110 Atherosclerotic heart disease of native coronary artery with unstable angina pectoris: Secondary | ICD-10-CM | POA: Diagnosis present

## 2024-11-05 DIAGNOSIS — R739 Hyperglycemia, unspecified: Secondary | ICD-10-CM | POA: Diagnosis not present

## 2024-11-05 DIAGNOSIS — E875 Hyperkalemia: Secondary | ICD-10-CM | POA: Diagnosis not present

## 2024-11-05 DIAGNOSIS — E119 Type 2 diabetes mellitus without complications: Secondary | ICD-10-CM

## 2024-11-05 DIAGNOSIS — Z951 Presence of aortocoronary bypass graft: Secondary | ICD-10-CM

## 2024-11-05 DIAGNOSIS — E1122 Type 2 diabetes mellitus with diabetic chronic kidney disease: Secondary | ICD-10-CM | POA: Diagnosis present

## 2024-11-05 DIAGNOSIS — I7121 Aneurysm of the ascending aorta, without rupture: Secondary | ICD-10-CM | POA: Diagnosis present

## 2024-11-05 DIAGNOSIS — Z7984 Long term (current) use of oral hypoglycemic drugs: Secondary | ICD-10-CM

## 2024-11-05 DIAGNOSIS — Z8249 Family history of ischemic heart disease and other diseases of the circulatory system: Secondary | ICD-10-CM

## 2024-11-05 DIAGNOSIS — K219 Gastro-esophageal reflux disease without esophagitis: Secondary | ICD-10-CM | POA: Diagnosis present

## 2024-11-05 DIAGNOSIS — Z8551 Personal history of malignant neoplasm of bladder: Secondary | ICD-10-CM

## 2024-11-05 DIAGNOSIS — I714 Abdominal aortic aneurysm, without rupture, unspecified: Secondary | ICD-10-CM | POA: Diagnosis present

## 2024-11-05 DIAGNOSIS — Z79899 Other long term (current) drug therapy: Secondary | ICD-10-CM

## 2024-11-05 DIAGNOSIS — I959 Hypotension, unspecified: Secondary | ICD-10-CM | POA: Diagnosis not present

## 2024-11-05 DIAGNOSIS — F05 Delirium due to known physiological condition: Secondary | ICD-10-CM | POA: Diagnosis present

## 2024-11-05 DIAGNOSIS — Z794 Long term (current) use of insulin: Secondary | ICD-10-CM

## 2024-11-05 DIAGNOSIS — E11649 Type 2 diabetes mellitus with hypoglycemia without coma: Secondary | ICD-10-CM | POA: Diagnosis present

## 2024-11-05 DIAGNOSIS — Z7901 Long term (current) use of anticoagulants: Secondary | ICD-10-CM

## 2024-11-05 DIAGNOSIS — I129 Hypertensive chronic kidney disease with stage 1 through stage 4 chronic kidney disease, or unspecified chronic kidney disease: Secondary | ICD-10-CM | POA: Diagnosis present

## 2024-11-05 DIAGNOSIS — R451 Restlessness and agitation: Secondary | ICD-10-CM | POA: Diagnosis not present

## 2024-11-05 DIAGNOSIS — E1151 Type 2 diabetes mellitus with diabetic peripheral angiopathy without gangrene: Secondary | ICD-10-CM | POA: Diagnosis present

## 2024-11-05 DIAGNOSIS — I700 Atherosclerosis of aorta: Secondary | ICD-10-CM | POA: Diagnosis present

## 2024-11-05 DIAGNOSIS — Z8679 Personal history of other diseases of the circulatory system: Secondary | ICD-10-CM

## 2024-11-05 DIAGNOSIS — Z860101 Personal history of adenomatous and serrated colon polyps: Secondary | ICD-10-CM

## 2024-11-05 DIAGNOSIS — I2510 Atherosclerotic heart disease of native coronary artery without angina pectoris: Secondary | ICD-10-CM | POA: Diagnosis not present

## 2024-11-05 DIAGNOSIS — D72829 Elevated white blood cell count, unspecified: Secondary | ICD-10-CM | POA: Diagnosis present

## 2024-11-05 DIAGNOSIS — D62 Acute posthemorrhagic anemia: Secondary | ICD-10-CM | POA: Diagnosis not present

## 2024-11-05 DIAGNOSIS — E1165 Type 2 diabetes mellitus with hyperglycemia: Secondary | ICD-10-CM | POA: Diagnosis present

## 2024-11-05 DIAGNOSIS — Z87891 Personal history of nicotine dependence: Secondary | ICD-10-CM

## 2024-11-05 DIAGNOSIS — C679 Malignant neoplasm of bladder, unspecified: Secondary | ICD-10-CM | POA: Insufficient documentation

## 2024-11-05 DIAGNOSIS — Z955 Presence of coronary angioplasty implant and graft: Secondary | ICD-10-CM

## 2024-11-05 LAB — HEPATIC FUNCTION PANEL
ALT: 26 U/L (ref 0–44)
AST: 27 U/L (ref 15–41)
Albumin: 3.9 g/dL (ref 3.5–5.0)
Alkaline Phosphatase: 82 U/L (ref 38–126)
Bilirubin, Direct: 0.4 mg/dL — ABNORMAL HIGH (ref 0.0–0.2)
Indirect Bilirubin: 1.7 mg/dL — ABNORMAL HIGH (ref 0.3–0.9)
Total Bilirubin: 2.1 mg/dL — ABNORMAL HIGH (ref 0.0–1.2)
Total Protein: 6 g/dL — ABNORMAL LOW (ref 6.5–8.1)

## 2024-11-05 LAB — APTT: aPTT: 76 s — ABNORMAL HIGH (ref 24–36)

## 2024-11-05 LAB — CBC
HCT: 43.1 % (ref 39.0–52.0)
Hemoglobin: 14.6 g/dL (ref 13.0–17.0)
MCH: 32.9 pg (ref 26.0–34.0)
MCHC: 33.9 g/dL (ref 30.0–36.0)
MCV: 97.1 fL (ref 80.0–100.0)
Platelets: 177 K/uL (ref 150–400)
RBC: 4.44 MIL/uL (ref 4.22–5.81)
RDW: 12.3 % (ref 11.5–15.5)
WBC: 7.3 K/uL (ref 4.0–10.5)
nRBC: 0 % (ref 0.0–0.2)

## 2024-11-05 LAB — BASIC METABOLIC PANEL WITH GFR
Anion gap: 14 (ref 5–15)
Anion gap: 10 (ref 5–15)
BUN: 18 mg/dL (ref 8–23)
BUN: 19 mg/dL (ref 8–23)
CO2: 23 mmol/L (ref 22–32)
CO2: 29 mmol/L (ref 22–32)
Calcium: 8.2 mg/dL — ABNORMAL LOW (ref 8.9–10.3)
Calcium: 8.8 mg/dL — ABNORMAL LOW (ref 8.9–10.3)
Chloride: 96 mmol/L — ABNORMAL LOW (ref 98–111)
Chloride: 100 mmol/L (ref 98–111)
Creatinine, Ser: 1.52 mg/dL — ABNORMAL HIGH (ref 0.61–1.24)
Creatinine, Ser: 1.73 mg/dL — ABNORMAL HIGH (ref 0.61–1.24)
GFR, Estimated: 47 mL/min — ABNORMAL LOW (ref >60)
GFR, Estimated: 40 mL/min — ABNORMAL LOW (ref >60)
Glucose, Bld: 377 mg/dL — ABNORMAL HIGH (ref 70–99)
Glucose, Bld: 156 mg/dL — ABNORMAL HIGH (ref 70–99)
Potassium: 6 mmol/L — ABNORMAL HIGH (ref 3.5–5.1)
Potassium: 4.2 mmol/L (ref 3.5–5.1)
Sodium: 133 mmol/L — ABNORMAL LOW (ref 135–145)
Sodium: 139 mmol/L (ref 135–145)

## 2024-11-05 LAB — TROPONIN I (HIGH SENSITIVITY)
Troponin I (High Sensitivity): 41 ng/L — ABNORMAL HIGH (ref <18)
Troponin I (High Sensitivity): 76 ng/L — ABNORMAL HIGH (ref <18)

## 2024-11-05 LAB — CBG MONITORING, ED
Glucose-Capillary: 326 mg/dL — ABNORMAL HIGH (ref 70–99)
Glucose-Capillary: 259 mg/dL — ABNORMAL HIGH (ref 70–99)

## 2024-11-05 LAB — GLUCOSE, CAPILLARY: Glucose-Capillary: 115 mg/dL — ABNORMAL HIGH (ref 70–99)

## 2024-11-05 LAB — LIPASE, BLOOD: Lipase: 28 U/L (ref 11–51)

## 2024-11-05 MED ORDER — LEVOTHYROXINE SODIUM 100 MCG PO TABS
100.0000 ug | ORAL_TABLET | Freq: Every day | ORAL | Status: DC
  Administered 2024-11-06 – 2024-11-07 (×2): 100 ug via ORAL
  Filled 2024-11-05 (×2): qty 1

## 2024-11-05 MED ORDER — INSULIN ASPART 100 UNIT/ML IV SOLN: 5.0000 [IU] | Freq: Once | INTRAVENOUS | Status: DC

## 2024-11-05 MED ORDER — CALCIUM GLUCONATE 10 % IV SOLN
1.0000 g | Freq: Once | INTRAVENOUS | Status: AC
  Administered 2024-11-05: 1 g via INTRAVENOUS
  Filled 2024-11-05: qty 10

## 2024-11-05 MED ORDER — HYDROCHLOROTHIAZIDE 12.5 MG PO CAPS: 12.5000 mg | ORAL_CAPSULE | Freq: Every day | ORAL | Status: DC

## 2024-11-05 MED ORDER — SODIUM CHLORIDE 0.9 % WEIGHT BASED INFUSION
1.0000 mL/kg/h | INTRAVENOUS | Status: DC
  Administered 2024-11-06 (×2): 1 mL/kg/h via INTRAVENOUS

## 2024-11-05 MED ORDER — ACETAMINOPHEN 325 MG PO TABS: 650.0000 mg | ORAL_TABLET | ORAL | Status: DC | PRN

## 2024-11-05 MED ORDER — DILTIAZEM HCL ER COATED BEADS 120 MG PO CP24: 120.0000 mg | ORAL_CAPSULE | Freq: Every day | ORAL | Status: DC

## 2024-11-05 MED ORDER — SODIUM CHLORIDE 0.9 % WEIGHT BASED INFUSION
3.0000 mL/kg/h | INTRAVENOUS | Status: DC
  Administered 2024-11-06: 3 mL/kg/h via INTRAVENOUS

## 2024-11-05 MED ORDER — INSULIN ASPART 100 UNIT/ML IJ SOLN: 0.0000 [IU] | Freq: Every day | INTRAMUSCULAR | Status: DC

## 2024-11-05 MED ORDER — HEPARIN (PORCINE) 25000 UT/250ML-% IV SOLN
1100.0000 [IU]/h | INTRAVENOUS | Status: DC
  Administered 2024-11-05 – 2024-11-06 (×2): 1100 [IU]/h via INTRAVENOUS
  Filled 2024-11-05 (×2): qty 250

## 2024-11-05 MED ORDER — SODIUM ZIRCONIUM CYCLOSILICATE 10 G PO PACK
10.0000 g | PACK | Freq: Once | ORAL | Status: AC
  Administered 2024-11-05: 10 g via ORAL
  Filled 2024-11-05: qty 1

## 2024-11-05 MED ORDER — ONDANSETRON HCL 4 MG/2ML IJ SOLN: 4.0000 mg | Freq: Four times a day (QID) | INTRAMUSCULAR | Status: DC | PRN

## 2024-11-05 MED ORDER — IOHEXOL 350 MG/ML SOLN
75.0000 mL | Freq: Once | INTRAVENOUS | Status: AC | PRN
  Administered 2024-11-05: 75 mL via INTRAVENOUS

## 2024-11-05 MED ORDER — ASPIRIN 81 MG PO CHEW
81.0000 mg | CHEWABLE_TABLET | ORAL | Status: AC
  Administered 2024-11-06: 81 mg via ORAL
  Filled 2024-11-05: qty 1

## 2024-11-05 MED ORDER — INSULIN ASPART 100 UNIT/ML IJ SOLN
0.0000 [IU] | Freq: Three times a day (TID) | INTRAMUSCULAR | Status: DC
  Administered 2024-11-05: 8 [IU] via SUBCUTANEOUS

## 2024-11-05 MED ORDER — MORPHINE SULFATE (PF) 2 MG/ML IV SOLN
2.0000 mg | Freq: Once | INTRAVENOUS | Status: AC
  Administered 2024-11-05: 2 mg via INTRAVENOUS
  Filled 2024-11-05: qty 1

## 2024-11-05 MED ORDER — PANTOPRAZOLE SODIUM 40 MG PO TBEC
40.0000 mg | DELAYED_RELEASE_TABLET | Freq: Every day | ORAL | Status: DC
  Administered 2024-11-06: 40 mg via ORAL
  Filled 2024-11-05: qty 1

## 2024-11-05 MED ORDER — INSULIN GLARGINE-YFGN 100 UNIT/ML ~~LOC~~ SOLN
20.0000 [IU] | Freq: Every day | SUBCUTANEOUS | Status: DC
  Administered 2024-11-05 – 2024-11-06 (×2): 20 [IU] via SUBCUTANEOUS
  Filled 2024-11-05 (×3): qty 0.2

## 2024-11-05 MED ORDER — ATORVASTATIN CALCIUM 40 MG PO TABS
40.0000 mg | ORAL_TABLET | Freq: Every day | ORAL | Status: DC
  Administered 2024-11-06 – 2024-11-12 (×6): 40 mg via ORAL
  Filled 2024-11-05 (×6): qty 1

## 2024-11-05 MED ORDER — LACTATED RINGERS IV BOLUS
1000.0000 mL | Freq: Once | INTRAVENOUS | Status: AC
  Administered 2024-11-05: 1000 mL via INTRAVENOUS

## 2024-11-05 MED ORDER — ASPIRIN 81 MG PO TBEC
81.0000 mg | DELAYED_RELEASE_TABLET | Freq: Every day | ORAL | Status: DC
  Administered 2024-11-06: 81 mg via ORAL
  Filled 2024-11-05: qty 1

## 2024-11-05 MED ORDER — FUROSEMIDE 10 MG/ML IJ SOLN
20.0000 mg | Freq: Once | INTRAMUSCULAR | Status: AC
  Administered 2024-11-05: 20 mg via INTRAVENOUS
  Filled 2024-11-05: qty 2

## 2024-11-05 MED ORDER — NITROGLYCERIN 0.4 MG SL SUBL: 0.4000 mg | SUBLINGUAL_TABLET | SUBLINGUAL | Status: DC | PRN

## 2024-11-05 NOTE — ED Notes (Signed)
 Called and made floor aware of PT arrival

## 2024-11-05 NOTE — Consult Note (Addendum)
 Cardiology Consultation  Patient ID: Frederick Miles MRN: [id number]; DOB: 1946/03/27  Admit date: 11/05/2024 Date of Consult: 11/05/2024  PCP:  Frederick Rush, MD   [facility] Providers Cardiologist:  Frederick Fell, MD     Patient Profile: Frederick Miles is a 78 y.o. male with a hx of CAD s/p DES to prox LAD in 2007, paroxysmal A-Fib, infrarenal AAA followed by vascular surgery, hypertension, type 2 diabetes, hypothyroidism, GERD, thoracic aortic aneurysm (4.4 cm on CTA 09/2024) who is being seen 11/05/2024 for the evaluation of chest pain at the request of Frederick Miles.  History of Present Illness: Frederick Miles has past medical history as stated above. He presented to [facility] ED on 11/05/2024 with complaints of chest pain that started around 7:30 AM this morning. He was given 324 mg ASA and SL NTG by EMS. He reports that the chest pain radiates towards his back.   Relevant workup while in the ED includes: EKG showed sinus rhythm, HR 60s, no acute ischemic changes when compared to prior. CBC normal, BMP showed hyperkalemia with potassium 6.0, creatinine 1.52 (baseline ~1.3), initial troponin 41 pending second, CXR showed hyperinflated lungs with no acute process. Although he recently underwent CTA to evaluate his thoracic aortic aneurysm, it was updated today with the complaints of chest pain radiating to his back, it resulted showing no evidence of acute aortic syndrome, ascending thoracic aortic aneurysm was stable at 4.4 cm.   He was given calcium gluconate, IV Lasix 20 mg x 1 for his hyperkalemia, they have also ordered Lokelma 10 g. He has otherwise received, morphine 2 mg x 1 dose for pain and started on IV fluids with 1 L LR ordered.   He is a patient of Dr. Fell and was last seen by Frederick Pavy, PA-C 10/26/2024 for follow up. At this time his medication regimen included: Eliquis  5 mg BID, diltiazem  120 mg daily, hydrochlorothiazide  12.5 mg daily, Jardiance 10 mg daily,  lisinopril  20 mg daily, Lipitor 40 mg daily. A CTA was ordered at this visit to check on his thoracic aortic aneurysm, as of 09/2024 it was noted to be 4.4 cm.  After speaking with the patient, he tells me that he has been having some mild chest pain at night when he's laying down. This was the first time he had more intense pain which is what prompted him to come get evaluated. He tells me that this pain was dull and not like his prior MI. He tells me that he has had complete resolution of his pain since being in the ED. He tells me that is fairly active and has not had any recent exertional symptoms. He says recently they have just been having to adjust his medications in regards to his renal function and potassium level. Overall, he is feeling well and hasn o active pain. I discussed with him the various options for ischemic evaluation especially considering his last cath was 2007 and stress test was 2016.   Past Medical History:  Diagnosis Date   AAA (abdominal aortic aneurysm) without rupture 04/28/2017   Aorta US  1/18: 4.2 cm AAA   Bladder cancer (HCC) 2006   Cervicalgia    Coronary atherosclerosis of native coronary artery    stent x1   Diabetes mellitus without complication (HCC)    typ2   Esophageal reflux    Esophageal stricture    Heart murmur    slight   History of cardiovascular stress test    Lexiscan   Myoview  7/16: EF 66%, diaphragmatic attenuation, no ischemia, low risk   History of kidney stones    Hx of adenomatous colonic polyps    Hyperlipidemia    Hypertension    Myocardial infarct [facility])    2007   Unspecified hypothyroidism    Past Surgical History:  Procedure Laterality Date   BIOPSY  12/07/2021   Procedure: BIOPSY;  Surgeon: Frederick Toribio SQUIBB, MD;  Location: [facility];  Service: Endoscopy;;   COLONOSCOPY     CORONARY ANGIOPLASTY WITH STENT PLACEMENT  2007   CYSTOSCOPY/RETROGRADE/URETEROSCOPY/STONE EXTRACTION WITH BASKET  08/08/2012   Procedure:  CYSTOSCOPY/RETROGRADE/URETEROSCOPY/STONE EXTRACTION WITH BASKET;  Surgeon: Frederick JINNY Seltzer, MD;  Location: [facility];  Service: Urology;  Laterality: Left;  Left Ureteroscopy with Stone Extraction   ESOPHAGOGASTRODUODENOSCOPY (EGD) WITH PROPOFOL  N/A 12/07/2021   Procedure: ESOPHAGOGASTRODUODENOSCOPY (EGD) WITH PROPOFOL ;  Surgeon: Frederick Toribio SQUIBB, MD;  Location: [facility];  Service: Endoscopy;  Laterality: N/A;   EUS N/A 12/07/2021   Procedure: UPPER ENDOSCOPIC ULTRASOUND (EUS) RADIAL;  Surgeon: Frederick Toribio SQUIBB, MD;  Location: [facility];  Service: Endoscopy;  Laterality: N/A;   EUS N/A 12/07/2021   Procedure: UPPER ENDOSCOPIC ULTRASOUND (EUS) LINEAR;  Surgeon: Frederick Toribio SQUIBB, MD;  Location: [facility];  Service: Endoscopy;  Laterality: N/A;   FINE NEEDLE ASPIRATION N/A 12/07/2021   Procedure: FINE NEEDLE ASPIRATION (FNA) LINEAR;  Surgeon: Frederick Toribio SQUIBB, MD;  Location: [facility];  Service: Endoscopy;  Laterality: N/A;   SKIN TAG REMOVAL  07/09/06   right anterior thigh   TONSILLECTOMY AND ADENOIDECTOMY     TRANSURETHRAL RESECTION OF BLADDER  07/09/06   tumor 2-5 cm   URETEROSCOPY WITH HOLMIUM LASER LITHOTRIPSY Left 01/06/2019   Procedure: LEFT URETEROSCOPY left retrograde pylegram basket extraction stone bladder biopsy fulgeration;  Surgeon: Miles Norleen, MD;  Location: [facility];  Service: Urology;  Laterality: Left;    Home Medications:  Prior to Admission medications   Medication Sig Start Date End Date Taking? Authorizing Provider  apixaban  (ELIQUIS ) 5 MG TABS tablet TAKE 1 TABLET(5 MG) BY MOUTH TWICE DAILY 09/29/24   Miles, Michael, MD  atorvastatin (LIPITOR) 40 MG tablet Take 40 mg by mouth daily.    [provider]  Continuous Blood Gluc Sensor (FREESTYLE LIBRE 2 SENSOR) MISC CHANGE EVERY 14 DAYS TO MONITOR BLOOD GLUCOSE CONTINUOUSLY 08/24/20   [provider]  cyanocobalamin (VITAMIN B12) 1000 MCG tablet Take 1 tablet (1,000 mcg total) by mouth daily. 08/25/24   Frederick Lek, MD  diltiazem  (CARDIZEM  CD) 120 MG 24 hr capsule TAKE 1 CAPSULE(120 MG) BY MOUTH DAILY 07/08/24   Miles, Michael, MD  empagliflozin (JARDIANCE) 10 MG TABS tablet Take 10 mg by mouth daily.     [provider]  hydrochlorothiazide  (MICROZIDE ) 12.5 MG capsule Take 1 capsule (12.5 mg total) by mouth daily. 10/26/24 01/24/25  Parthenia Frederick HERO, PA-C  insulin  glargine (LANTUS SOLOSTAR) 100 UNIT/ML Solostar Pen Inject 24 Units into the skin at bedtime.    [provider]  insulin  lispro (HUMALOG) 100 UNIT/ML injection Inject 12 Units into the skin 3 (three) times daily with meals.    [provider]  levothyroxine (SYNTHROID) 100 MCG tablet Take 100 mcg by mouth daily. 03/10/24   [provider]  lisinopril  (ZESTRIL ) 20 MG tablet TAKE 1 TABLET BY MOUTH EVERY DAY 01/02/22   Wonda Sharper, MD  Magnesium  500 MG CAPS Take 500 mg by mouth daily.    [provider]  metFORMIN (GLUCOPHAGE) 500 MG tablet  Take 500 mg by mouth daily. 08/28/16   [provider]  nitroGLYCERIN  (NITROSTAT ) 0.4 MG SL tablet Place 0.4 mg under the tongue every 5 (five) minutes as needed for chest pain.    [provider]  omeprazole (PRILOSEC) 20 MG capsule Take 20 mg by mouth daily.    [provider]   Scheduled Meds:  [START ON 11/06/2024] aspirin EC  81 mg Oral Daily   [START ON 11/06/2024] atorvastatin  40 mg Oral Daily   [START ON 11/06/2024] hydrochlorothiazide   12.5 mg Oral Daily   [START ON 11/06/2024] levothyroxine  100 mcg Oral Daily   [START ON 11/06/2024] pantoprazole  40 mg Oral Daily   Continuous Infusions:  heparin 1,100 Units/hr (11/05/24 1456)   PRN Meds: acetaminophen , nitroGLYCERIN , ondansetron  (ZOFRAN ) IV Allergies:    Allergies  Allergen Reactions   Quinolones Other (See Comments)     Unknown   Social History:   Social History   Socioeconomic History   Marital status: Married    Spouse name: Not on file   Number of children: 2    Years of education: Not on file   Highest education level: Not on file  Occupational History    Employer: [employer]  Tobacco Use   Smoking status: Former    Current packs/day: 0.00    Types: Cigarettes    Quit date: 08/05/1979    Years since quitting: 45.2   Smokeless tobacco: Never  Vaping Use   Vaping status: Never Used  Substance and Sexual Activity   Alcohol use: Yes    Alcohol/week: 5.0 standard drinks of alcohol    Types: 4 Glasses of wine, 1 Cans of beer per week   Drug use: No   Sexual activity: Yes  Other Topics Concern   Not on file  Social History Narrative   Daily caffeine    Social Drivers of Health   Financial Resource Strain: Not on file  Food Insecurity: Not on file  Transportation Needs: Not on file  Physical Activity: Unknown (08/11/2018)   Exercise Vital Sign    Days of Exercise per Week: 4 days    Minutes of Exercise per Session: Not on file  Stress: Not on file  Social Connections: Not on file  Intimate Partner Violence: Not on file    Family History:   Family History  Problem Relation Age of Onset   Heart disease Father    Heart attack Father    Heart disease Mother    Heart attack Paternal Grandfather    Heart attack Paternal Uncle    Colon cancer Neg Hx    Stroke Neg Hx    Diabetes Neg Hx    Stomach cancer Neg Hx    Rectal cancer Neg Hx     ROS:  Please see the history of present illness.  All other ROS reviewed and negative.     Physical Exam/Data: Vitals:   11/05/24 0952 11/05/24 0953 11/05/24 1314  BP: 111/67  (!) 139/94  Pulse: 68  (!) 56  Resp: 18  16  Temp: 97.6 F (36.4 C)  98 F (36.7 C)  TempSrc:   Oral  SpO2: 97%  100%  Weight:  74.8 kg   Height:  5' 10 (1.778 m)    Intake/Output Summary (Last 24 hours) at 11/05/2024 1520 Last data filed at 11/05/2024 1451 Gross per 24 hour  Intake 1019.52 ml  Output 225 ml  Net 794.52 ml      11/05/2024  9:53 AM 10/26/2024   12:21 PM 10/05/2024    9:48 AM   Last 3 Weights  Weight (lbs) 165 lb 156 lb 6.4 oz 157 lb  Weight (kg) 74.844 kg 70.943 kg 71.215 kg     Body mass index is 23.68 kg/m.   General:  in no acute distress HEENT: normal Neck: no JVD Vascular: Distal pulses 2+ bilaterally Cardiac:  normal S1, S2; RRR; no murmur  Lungs:  clear to auscultation bilaterally Abd: soft, nontender, no hepatomegaly  Ext: no edema Musculoskeletal:  No deformities Skin: warm and dry  Neuro:  no focal abnormalities noted Psych:  Normal affect   EKG:  The EKG was personally reviewed and demonstrates:  sinus rhythm, HR 60s (calculated as 122 but this is not accurate), no acute ischemic changes when compared to prior EKG from Oct. 2025  Telemetry:  Telemetry was personally reviewed and demonstrates:  sinus rhythm, HR 50-60s  Relevant CV Studies:  CT Angio Chest/Aorta, 10/23/2024 Mild to moderate calcific atherosclerosis of the thoracic aorta and great vessels without stenosis or dissection. Mild dilation of the mid ascending aorta measuring up to 4.4 cm, minimally changed from prior measurement of 4.3 cm. Annual CTA or MRA follow-up recommended. 3 vessel Coronary artery disease with prior lad stenting. Mild centrilobular emphysema  Echocardiogram, 05/16/2023 Consider f/ u gated chest CTA to further size the ascending aorta.  Left ventricular ejection fraction, by estimation, is 60 to 65% . The left ventricle has normal function. The left ventricle has no regional wall motion abnormalities. Left ventricular diastolic parameters were normal.  Right ventricular systolic function is normal. The right ventricular size is normal.  Left atrial size was moderately dilated.  The mitral valve is normal in structure. No evidence of mitral valve regurgitation. No evidence of mitral stenosis.  The aortic valve is tricuspid. There is mild calcification of the aortic valve. Aortic valve regurgitation is not visualized. Aortic valve sclerosis is present, with no  evidence of aortic valve stenosis.  Aortic dilatation noted. There is severe dilatation of the ascending aorta, measuring 46 mm.  The inferior vena cava is normal in size with greater than 50% respiratory variability, suggesting right atrial pressure of 3 mmHg.  Lexiscan , 07/19/2015 The left ventricular ejection fraction is hyperdynamic (>65%). Nuclear stress EF: 66%. There was no ST segment deviation noted during stress. This is a low risk study.   Low risk stress nuclear study with a small, medium intensity, fixed inferior defect consistent with diaphragmatic attenuation; no ischemia; EF 66 and normal wall motion  Cardiac catheterization, 12/11/2006 LV:  133/0/17.  EF 65% without regional wall motion abnormality. No aortic stenosis or mitral regurgitation. Left main:  Angiographically normal. LAD:  95% stenosis of the proximal vessel which was stented to no residual.  There is a single moderate-sized diagonal.  This vessel has a 50% stenosis in its midsection. Circumflex:  Large vessel giving rise to a single branching obtuse marginal.  It has only minor luminal irregularities. RCA:  Large, dominant vessel.  It is angiographically normal.  IMPRESSION/PLAN:  Single vessel coronary disease with successful revascularization of the proximal left anterior descending artery using a drug-eluting stent.  Since a drug-eluting stent was used, he should be maintained on both aspirin and Plavix indefinitely.  Laboratory Data: High Sensitivity Troponin:   Recent Labs  Lab 11/05/24 0959 11/05/24 1253  TROPONINIHS 41* 76*     Chemistry Recent Labs  Lab 11/05/24 0959  NA 133*  K 6.0*  CL 96*  CO2 23  GLUCOSE 377*  BUN 18  CREATININE 1.52*  CALCIUM 8.2*  GFRNONAA 47*  ANIONGAP 14    Recent Labs  Lab 11/05/24 0959  PROT 6.0*  ALBUMIN 3.9  AST 27  ALT 26  ALKPHOS 82  BILITOT 2.1*   Lipids No results for input(s): CHOL, TRIG, HDL, LABVLDL, LDLCALC, CHOLHDL in the last  168 hours.  Hematology Recent Labs  Lab 11/05/24 0959  WBC 7.3  RBC 4.44  HGB 14.6  HCT 43.1  MCV 97.1  MCH 32.9  MCHC 33.9  RDW 12.3  PLT 177   Thyroid No results for input(s): TSH, FREET4 in the last 168 hours.  BNPNo results for input(s): BNP, PROBNP in the last 168 hours.  DDimer No results for input(s): DDIMER in the last 168 hours.  Radiology/Studies:  CT Angio Chest Aorta W and/or Wo Contrast Result Date: 11/05/2024 EXAM: CTA CHEST 11/05/2024 11:54:58 AM TECHNIQUE: CTA of the chest was performed after the administration of intravenous contrast. Without and with IV contrast was administered, including 75 mL iohexol  (OMNIPAQUE ) 350 MG/ML injection. Multiplanar reformatted images are provided for review. MIP images are provided for review. Automated exposure control, iterative reconstruction, and/or weight based adjustment of the mA/kV was utilized to reduce the radiation dose to as low as reasonably achievable. COMPARISON: 10/23/2024 CLINICAL HISTORY: Acute aortic syndrome (AAS) suspected FINDINGS: PULMONARY ARTERIES: Pulmonary arteries are adequately opacified for evaluation. No acute pulmonary embolus. Main pulmonary artery is normal in caliber. MEDIASTINUM: The heart and pericardium demonstrate no acute abnormality. There is a 4.4 cm ascending thoracic aortic aneurysm. Aortic atherosclerosis is present. No dissection is noted. LYMPH NODES: No mediastinal, hilar or axillary lymphadenopathy. LUNGS AND PLEURA: The lungs are without acute process. No focal consolidation or pulmonary edema. No evidence of pleural effusion or pneumothorax. UPPER ABDOMEN: Limited images of the upper abdomen are unremarkable. SOFT TISSUES AND BONES: No acute bone or soft tissue abnormality. IMPRESSION: 1. No evidence of acute aortic syndrome. 2. Stable ascending thoracic aortic aneurysm measuring 4.4 cm. 3. Aortic atherosclerosis. Electronically signed by: Lynwood Seip MD 11/05/2024 01:17 PM EST RP  Workstation: [id number]   DG Chest 2 View Result Date: 11/05/2024 EXAM: 2 VIEW(S) XRAY OF THE CHEST 11/05/2024 10:14:00 AM COMPARISON: 06/25/2015 CLINICAL HISTORY: CP FINDINGS: LUNGS AND PLEURA: Hyperinflated lungs. No focal pulmonary opacity. No pulmonary edema. No pleural effusion. No pneumothorax. HEART AND MEDIASTINUM: No acute abnormality of the cardiac and mediastinal silhouettes. BONES AND SOFT TISSUES: Punctate radiopaque foreign bodies over left chest. No acute osseous abnormality. IMPRESSION: 1. No acute cardiopulmonary process. 2. Hyperinflated lungs. Electronically signed by: Frederick Boxer MD 11/05/2024 11:08 AM EST RP Workstation: [id number]   Assessment and Plan:  NSTEMI Presented to ED with chest pain, onset this morning Troponin 41, pending second value No active chest pain, but pain did relieve with SL NTG Discussed various options for ischemic evaluation  Favorable to repeat LHC as he has not had one since 2007 Given 324 mg ASA  Will discuss option for ischemic evaluation with MD and patient and await result of second troponin, favor repeat LHC -- last dose Eliquis  11/6 AM Plan for [facility] tomorrow 11/7 afternoon Start ASA 81 mg daily Check echocardiogram   Informed Consent   Shared Decision Making/Informed Consent The risks [stroke (1 in 1000), death (1 in 1000), kidney failure [usually temporary] (1 in 500), bleeding (1 in 200), allergic reaction [possibly serious] (1 in 200)], benefits (diagnostic support and management of coronary artery disease) and alternatives  of a cardiac catheterization were discussed in detail with Mr. Cygan and he is willing to proceed.    CAD s/p DES to prox LAD 2007 Hyperlipidemia  Home meds: Lipitor 40 mg daily Low risk Lexiscan  performed 07/2015 Plan for ischemic evaluation as above  Continue home Lipitor  Check lipid panel in AM Start ASA 81 mg daily  Paroxysmal atrial fibrillation  Known history of PAF Home meds: diltiazem  120 mg  daily, Eliquis  5 mg BID Currently in sinus rhythm with HR 50-60s, HR typically 50s Continue to monitor on telemetry  Start IV heparin in place of Eliquis  for [facility]  Hypertension  Home meds: diltiazem  120 mg daily, hydrochlorothiazide  12.5 mg daily, lisinopril  20 mg daily Recently decreased hydrochlorothiazide  as an outpatient Hold hydrochlorothiazide  and lisinopril  for now with AKI   Thoracic aortic aneurysm  Monitoring with serial scans Measured at 4.4 cm on CTA from Oct. 2025  Repeat scan today showed no acute syndrome, stable ascending thoracic aortic aneurysm measuring 4.4 cm  Will continue to follow as outpatient with serial imaging   Per primary Hyperkalemia  AKI Type 2 diabetes  GERD  Hypothyroidism   Risk Assessment/Risk Scores:     CHA2DS2-VASc Score = 4   This indicates a 4.8% annual risk of stroke. The patient's score is based upon: CHF History: 0 HTN History: 1 Diabetes History: 0 Stroke History: 0 Vascular Disease History: 1 Age Score: 2 Gender Score: 0     For questions or updates, please contact [facility] Please consult [email or web address] for contact info under      Signed, Waddell DELENA Donath, PA-C  11/05/2024 3:20 PM

## 2024-11-05 NOTE — ED Triage Notes (Signed)
 Pt coming from home and c/o central, non radiating CP that started at 0730. Denies SHOB/n/v. EMS gave 324mg  of aspirin and 0.4mg  of nitroglycerin . Pt is on eliquis . BP 92/54 after nitroglycerin . Axox4.

## 2024-11-05 NOTE — Progress Notes (Addendum)
 PHARMACY - ANTICOAGULATION CONSULT NOTE  Pharmacy Consult for Heparin Indication: atrial fibrillation, ACS  Allergies  Allergen Reactions   Quinolones     Other Reaction(s): Unknown    Patient Measurements: Height: 5' 10 (177.8 cm) Weight: 74.8 kg (165 lb) IBW/kg (Calculated) : 73 HEPARIN DW (KG): 74.8  Vital Signs: Temp: 98 F (36.7 C) (11/06 1314) Temp Source: Oral (11/06 1314) BP: 139/94 (11/06 1314) Pulse Rate: 56 (11/06 1314)  Labs: Recent Labs    11/05/24 0959 11/05/24 1253  HGB 14.6  --   HCT 43.1  --   PLT 177  --   CREATININE 1.52*  --   TROPONINIHS 41* 76*    Estimated Creatinine Clearance: 42 mL/min (A) (by C-G formula based on SCr of 1.52 mg/dL (H)).   Medical History: Past Medical History:  Diagnosis Date   AAA (abdominal aortic aneurysm) without rupture 04/28/2017   Aorta US  1/18: 4.2 cm AAA   Bladder cancer (HCC) 2006   Cervicalgia    Coronary atherosclerosis of native coronary artery    stent x1   Diabetes mellitus without complication (HCC)    typ2   Esophageal reflux    Esophageal stricture    Heart murmur    slight   History of cardiovascular stress test    Lexiscan  Myoview  7/16: EF 66%, diaphragmatic attenuation, no ischemia, low risk   History of kidney stones    Hx of adenomatous colonic polyps    Hyperlipidemia    Hypertension    Myocardial infarct [facility])    2007   Unspecified hypothyroidism     Medications:  Infusions:   heparin      Assessment: Patient is a 78 year old male with a history of Afib on eliquis . He presents with chest pain. Pharmacy is consulted for heparin for ACS and Afib.   CBC WNL, no bleeding noted.   Goal of Therapy:  Heparin level 0.3-0.7 units/ml Monitor platelets by anticoagulation protocol: Yes   Plan:  Start heparin infusion at 1100 units/hr No bolus initially due to Elqiuis PTA  aPTT in 8 hours Follow daily HL for correlation, daily CBC Follow up Eliquis  restart   Audry Pecina M  Suzie Vandam 11/05/2024,2:12 PM

## 2024-11-05 NOTE — H&P (Signed)
 History and Physical   Frederick Miles FMW:[id number] DOB: 10-22-1946 DOA: 11/05/2024  PCP: Onita Rush, MD   Patient coming from: Home  Chief Complaint: Chest pain  HPI: Frederick Miles is a 78 y.o. male with medical history significant of hypertension, hyperlipidemia, hypothyroidism, CKD 3A, atrial fibrillation, GERD, PAD, CAD status post stent, aortic aneurysm, bladder cancer, emphysema presenting with chest pain.  Patient had onset of chest pain while he is watching the news.  Pain described as central dull chest pain radiating to arms and back.  No nausea, vomiting, shortness of breath, diaphoresis.  Improved with aspirin and nitroglycerin  which were given and route by EMS.  Does have history of MI and stent.  Denies fevers, chills, abdominal pain, constipation, diarrhea.  ED Course: Vital signs in the ED notable for blood pressure in the 110s-130s systolic, heart rate in the 50s-60s.  Lab workup included CMP with potassium 6.0, sodium 133 which corrected to normal considering glucose 377, chloride 90, creatinine 1.52 stable from baseline of 1.3-1.4, calcium 8.2.  LFTs showed protein of 6.0, T. bili 2.1.  CBC within normal limits.  Troponin trend 41, 76.  Lipase normal.  Chest x-ray showed no acute abnormality.  CTA aorta study showed no acute normality but did show stable thoracic aortic aneurysm.  Patient received 1 L IV fluids, dose of Lasix, morphine, heparin in the ED.  Also received calcium gluconate and Lokelma.  Cardiology consulted no seen the patient with plan for cath tomorrow.  Agree with heparin and recommend aspirin and echocardiogram.  Review of Systems: As per HPI otherwise all other systems reviewed and are negative.  Past Medical History:  Diagnosis Date   AAA (abdominal aortic aneurysm) without rupture 04/28/2017   Aorta US  1/18: 4.2 cm AAA   Bladder cancer (HCC) 2006   Cervicalgia    Coronary atherosclerosis of native coronary artery    stent x1    Diabetes mellitus without complication (HCC)    typ2   Esophageal reflux    Esophageal stricture    Heart murmur    slight   History of cardiovascular stress test    Lexiscan  Myoview  7/16: EF 66%, diaphragmatic attenuation, no ischemia, low risk   History of kidney stones    Hx of adenomatous colonic polyps    Hyperlipidemia    Hypertension    Myocardial infarct [facility])    2007   Unspecified hypothyroidism     Past Surgical History:  Procedure Laterality Date   BIOPSY  12/07/2021   Procedure: BIOPSY;  Surgeon: Teressa Toribio SQUIBB, MD;  Location: [facility];  Service: Endoscopy;;   COLONOSCOPY     CORONARY ANGIOPLASTY WITH STENT PLACEMENT  2007   CYSTOSCOPY/RETROGRADE/URETEROSCOPY/STONE EXTRACTION WITH BASKET  08/08/2012   Procedure: CYSTOSCOPY/RETROGRADE/URETEROSCOPY/STONE EXTRACTION WITH BASKET;  Surgeon: Rush JINNY Seltzer, MD;  Location: [facility];  Service: Urology;  Laterality: Left;  Left Ureteroscopy with Stone Extraction   ESOPHAGOGASTRODUODENOSCOPY (EGD) WITH PROPOFOL  N/A 12/07/2021   Procedure: ESOPHAGOGASTRODUODENOSCOPY (EGD) WITH PROPOFOL ;  Surgeon: Teressa Toribio SQUIBB, MD;  Location: [facility];  Service: Endoscopy;  Laterality: N/A;   EUS N/A 12/07/2021   Procedure: UPPER ENDOSCOPIC ULTRASOUND (EUS) RADIAL;  Surgeon: Teressa Toribio SQUIBB, MD;  Location: [facility];  Service: Endoscopy;  Laterality: N/A;   EUS N/A 12/07/2021   Procedure: UPPER ENDOSCOPIC ULTRASOUND (EUS) LINEAR;  Surgeon: Teressa Toribio SQUIBB, MD;  Location: [facility];  Service: Endoscopy;  Laterality: N/A;   FINE NEEDLE ASPIRATION N/A 12/07/2021   Procedure: FINE NEEDLE ASPIRATION (  FNA) LINEAR;  Surgeon: Teressa Toribio SQUIBB, MD;  Location: [facility];  Service: Endoscopy;  Laterality: N/A;   SKIN TAG REMOVAL  07/09/06   right anterior thigh   TONSILLECTOMY AND ADENOIDECTOMY     TRANSURETHRAL RESECTION OF BLADDER  07/09/06   tumor 2-5 cm   URETEROSCOPY WITH HOLMIUM LASER LITHOTRIPSY Left 01/06/2019   Procedure: LEFT  URETEROSCOPY left retrograde pylegram basket extraction stone bladder biopsy fulgeration;  Surgeon: Watt Rush, MD;  Location: [facility];  Service: Urology;  Laterality: Left;    Social History  reports that he quit smoking about 45 years ago. His smoking use included cigarettes. He has never used smokeless tobacco. He reports current alcohol use of about 5.0 standard drinks of alcohol per week. He reports that he does not use drugs.  Allergies  Allergen Reactions   Quinolones Other (See Comments)     Unknown    Family History  Problem Relation Age of Onset   Heart disease Father    Heart attack Father    Heart disease Mother    Heart attack Paternal Grandfather    Heart attack Paternal Uncle    Colon cancer Neg Hx    Stroke Neg Hx    Diabetes Neg Hx    Stomach cancer Neg Hx    Rectal cancer Neg Hx   Reviewed on admission  Prior to Admission medications   Medication Sig Start Date End Date Taking? Authorizing Provider  apixaban  (ELIQUIS ) 5 MG TABS tablet TAKE 1 TABLET(5 MG) BY MOUTH TWICE DAILY 09/29/24   Cooper, Michael, MD  atorvastatin (LIPITOR) 40 MG tablet Take 40 mg by mouth daily.    [provider]  Continuous Blood Gluc Sensor (FREESTYLE LIBRE 2 SENSOR) MISC CHANGE EVERY 14 DAYS TO MONITOR BLOOD GLUCOSE CONTINUOUSLY 08/24/20   [provider]  cyanocobalamin (VITAMIN B12) 1000 MCG tablet Take 1 tablet (1,000 mcg total) by mouth daily. 08/25/24   Gregg Lek, MD  diltiazem  (CARDIZEM  CD) 120 MG 24 hr capsule TAKE 1 CAPSULE(120 MG) BY MOUTH DAILY 07/08/24   Cooper, Michael, MD  empagliflozin (JARDIANCE) 10 MG TABS tablet Take 10 mg by mouth daily.     [provider]  hydrochlorothiazide  (MICROZIDE ) 12.5 MG capsule Take 1 capsule (12.5 mg total) by mouth daily. 10/26/24 01/24/25  Parthenia Olivia HERO, PA-C  insulin  glargine (LANTUS SOLOSTAR) 100 UNIT/ML Solostar Pen Inject 24 Units into the skin at bedtime.    [provider]  insulin  lispro  (HUMALOG) 100 UNIT/ML injection Inject 12 Units into the skin 3 (three) times daily with meals.    [provider]  levothyroxine (SYNTHROID) 100 MCG tablet Take 100 mcg by mouth daily. 03/10/24   [provider]  lisinopril  (ZESTRIL ) 20 MG tablet TAKE 1 TABLET BY MOUTH EVERY DAY 01/02/22   Wonda Sharper, MD  Magnesium  500 MG CAPS Take 500 mg by mouth daily.    [provider]  metFORMIN (GLUCOPHAGE) 500 MG tablet Take 500 mg by mouth daily. 08/28/16   [provider]  nitroGLYCERIN  (NITROSTAT ) 0.4 MG SL tablet Place 0.4 mg under the tongue every 5 (five) minutes as needed for chest pain.    [provider]  omeprazole (PRILOSEC) 20 MG capsule Take 20 mg by mouth daily.    [provider]    Physical Exam: Vitals:   11/05/24 0952 11/05/24 0953 11/05/24 1314  BP: 111/67  (!) 139/94  Pulse: 68  (!) 56  Resp: 18  16  Temp: 97.6  F (36.4 C)  98 F (36.7 C)  TempSrc:   Oral  SpO2: 97%  100%  Weight:  74.8 kg   Height:  5' 10 (1.778 m)     Physical Exam Constitutional:      General: He is not in acute distress.    Appearance: Normal appearance.  HENT:     Head: Normocephalic and atraumatic.     Mouth/Throat:     Mouth: Mucous membranes are moist.     Pharynx: Oropharynx is clear.  Eyes:     Extraocular Movements: Extraocular movements intact.     Pupils: Pupils are equal, round, and reactive to light.  Cardiovascular:     Rate and Rhythm: Normal rate and regular rhythm.     Pulses: Normal pulses.     Heart sounds: Normal heart sounds.  Pulmonary:     Effort: Pulmonary effort is normal. No respiratory distress.     Breath sounds: Normal breath sounds.  Abdominal:     General: Bowel sounds are normal. There is no distension.     Palpations: Abdomen is soft.     Tenderness: There is no abdominal tenderness.  Musculoskeletal:        General: No swelling or deformity.  Skin:    General: Skin is warm and dry.  Neurological:      General: No focal deficit present.     Mental Status: Mental status is at baseline.    Labs on Admission: I have personally reviewed following labs and imaging studies  CBC: Recent Labs  Lab 11/05/24 0959  WBC 7.3  HGB 14.6  HCT 43.1  MCV 97.1  PLT 177    Basic Metabolic Panel: Recent Labs  Lab 11/05/24 0959  NA 133*  K 6.0*  CL 96*  CO2 23  GLUCOSE 377*  BUN 18  CREATININE 1.52*  CALCIUM 8.2*    GFR: Estimated Creatinine Clearance: 42 mL/min (A) (by C-G formula based on SCr of 1.52 mg/dL (H)).  Liver Function Tests: Recent Labs  Lab 11/05/24 0959  AST 27  ALT 26  ALKPHOS 82  BILITOT 2.1*  PROT 6.0*  ALBUMIN 3.9    Urine analysis:    Component Value Date/Time   COLORURINE YELLOW 08/28/2016 1513   APPEARANCEUR CLEAR 08/28/2016 1513   LABSPEC 1.028 08/28/2016 1513   PHURINE 6.0 08/28/2016 1513   GLUCOSEU >1000 (A) 08/28/2016 1513   HGBUR NEGATIVE 08/28/2016 1513   BILIRUBINUR NEGATIVE 08/28/2016 1513   KETONESUR NEGATIVE 08/28/2016 1513   PROTEINUR NEGATIVE 08/28/2016 1513   NITRITE NEGATIVE 08/28/2016 1513   LEUKOCYTESUR NEGATIVE 08/28/2016 1513    Radiological Exams on Admission: CT Angio Chest Aorta W and/or Wo Contrast Result Date: 11/05/2024 EXAM: CTA CHEST 11/05/2024 11:54:58 AM TECHNIQUE: CTA of the chest was performed after the administration of intravenous contrast. Without and with IV contrast was administered, including 75 mL iohexol  (OMNIPAQUE ) 350 MG/ML injection. Multiplanar reformatted images are provided for review. MIP images are provided for review. Automated exposure control, iterative reconstruction, and/or weight based adjustment of the mA/kV was utilized to reduce the radiation dose to as low as reasonably achievable. COMPARISON: 10/23/2024 CLINICAL HISTORY: Acute aortic syndrome (AAS) suspected FINDINGS: PULMONARY ARTERIES: Pulmonary arteries are adequately opacified for evaluation. No acute pulmonary embolus. Main pulmonary  artery is normal in caliber. MEDIASTINUM: The heart and pericardium demonstrate no acute abnormality. There is a 4.4 cm ascending thoracic aortic aneurysm. Aortic atherosclerosis is present. No dissection is noted. LYMPH NODES: No mediastinal, hilar or axillary  lymphadenopathy. LUNGS AND PLEURA: The lungs are without acute process. No focal consolidation or pulmonary edema. No evidence of pleural effusion or pneumothorax. UPPER ABDOMEN: Limited images of the upper abdomen are unremarkable. SOFT TISSUES AND BONES: No acute bone or soft tissue abnormality. IMPRESSION: 1. No evidence of acute aortic syndrome. 2. Stable ascending thoracic aortic aneurysm measuring 4.4 cm. 3. Aortic atherosclerosis. Electronically signed by: Lynwood Seip MD 11/05/2024 01:17 PM EST RP Workstation: [id number]   DG Chest 2 View Result Date: 11/05/2024 EXAM: 2 VIEW(S) XRAY OF THE CHEST 11/05/2024 10:14:00 AM COMPARISON: 06/25/2015 CLINICAL HISTORY: CP FINDINGS: LUNGS AND PLEURA: Hyperinflated lungs. No focal pulmonary opacity. No pulmonary edema. No pleural effusion. No pneumothorax. HEART AND MEDIASTINUM: No acute abnormality of the cardiac and mediastinal silhouettes. BONES AND SOFT TISSUES: Punctate radiopaque foreign bodies over left chest. No acute osseous abnormality. IMPRESSION: 1. No acute cardiopulmonary process. 2. Hyperinflated lungs. Electronically signed by: Norleen Boxer MD 11/05/2024 11:08 AM EST RP Workstation: [id number]   EKG: Independently reviewed.  Sinus rhythm at 60-70 bpm.  Misinterpreted by computer read as tachycardia due to peaked T waves.  QRS normal at 70.  Assessment/Plan Active Problems:   Hypothyroidism   Hyperlipidemia LDL goal <70   Coronary artery disease involving native coronary artery of native heart without angina pectoris   GERD   Essential hypertension   Paroxysmal atrial fibrillation (HCC)   AAA (abdominal aortic aneurysm) without rupture   Peripheral vascular disease   Diabetes  mellitus (HCC)   Stage 3a chronic kidney disease (HCC)   NSTEMI CAD > Patient presenting with chest pain that started while watching the news. > Troponin trend 41, 76.  Pain relieved with aspirin and nitro and route. > History of prior MI and stent.  Has been a while since last cath. > Cardiology consulted and have seen the patient.  Plan for cath tomorrow.  Patient started on heparin and aspirin in the ED. - Monitor on progressive unit overnight - Appreciate cardiology recommendations and assistance - Continue heparin - Plan on cath tomorrow - Daily aspirin - Continue home atorvastatin - Holding lisinopril  as below - A.m. lipid panel - Echocardiogram  Hyperkalemia > Potassium 6.0 in the ED.  Has had some issues with this outpatient. > Some peaked T waves without QRS prolongation. > Received Lasix, calcium gluconate, Lokelma in the ED. - Continue to trend potassium, additional Lokelma as needed - Hold home lisinopril  - Supportive care  Hypertension - No longer on diltiazem  - Holding lisinopril  in the setting of above - Continue home hydrochlorothiazide   Hyperlipidemia - Continue home atorvastatin  Diabetes > 25 units long-acting insulin  at night and SSI with meals at home. Hyperglycemia in th ED. - 20 units long-acting insulin  at night - SSI  Hypothyroidism - Continue home Synthroid  CKD 3a > Creatinine in the ED is 1.5 which is near baseline of 1.3-1.4. - Trend renal function and electrolytes  Atrial fibrillation > Taken off diltiazem  this past summer.  Remains on Eliquis . - Holding Eliquis  in favor of heparin for now  GERD - Continue PPI  PAD - Continue home atorvastatin - On heparin as above - Aspirin as above  Thoracic aortic aneurysm - Stable on CT in the ED  History of bladder cancer Emphysema per chart - Noted   DVT prophylaxis: Heparin Code Status:   Full Family Communication:  Updated at bedside  Disposition Plan:   Patient is  from:  Home  Anticipated DC to:  Home  Anticipated DC  date:  1 to 3 days  Anticipated DC barriers: None  Consults called:  Cardiology Admission status:  Observation, progressive  Severity of Illness: The appropriate patient status for this patient is OBSERVATION. Observation status is judged to be reasonable and necessary in order to provide the required intensity of service to ensure the patient's safety. The patient's presenting symptoms, physical exam findings, and initial radiographic and laboratory data in the context of their medical condition is felt to place them at decreased risk for further clinical deterioration. Furthermore, it is anticipated that the patient will be medically stable for discharge from the hospital within 2 midnights of admission.    Marsa KATHEE Scurry MD [facility]  How to contact the [facility] Attending or Consulting provider 7A - 7P or covering provider during after hours 7P -7A, for this patient?   Check the care team in [facility] and look for a) attending/consulting [facility] provider listed and b) the [facility] team listed Log into [email or web address] and use [place]'s universal password to access. If you do not have the password, please contact the hospital operator. Locate the [facility] provider you are looking for under [facility] and page to a number that you can be directly reached. If you still have difficulty reaching the provider, please page the [facility] (Director on Call) for the Hospitalists listed on amion for assistance.  11/05/2024, 2:42 PM

## 2024-11-05 NOTE — Progress Notes (Signed)
 PHARMACY - ANTICOAGULATION CONSULT NOTE  Pharmacy Consult for Heparin Indication: atrial fibrillation, ACS  Allergies  Allergen Reactions   Quinolones Other (See Comments)     Unknown    Patient Measurements: Height: 5' 10 (177.8 cm) Weight: 74.8 kg (165 lb) IBW/kg (Calculated) : 73 HEPARIN DW (KG): 74.8  Vital Signs: Temp: 98.2 F (36.8 C) (11/06 2113) Temp Source: Oral (11/06 2113) BP: 97/65 (11/06 2113) Pulse Rate: 63 (11/06 2113)  Labs: Recent Labs    11/05/24 0959 11/05/24 1253 11/05/24 1829 11/05/24 2317  HGB 14.6  --   --   --   HCT 43.1  --   --   --   PLT 177  --   --   --   APTT  --   --   --  76*  CREATININE 1.52*  --  1.73*  --   TROPONINIHS 41* 76*  --   --     Estimated Creatinine Clearance: 36.9 mL/min (A) (by C-G formula based on SCr of 1.73 mg/dL (H)).   Medical History: Past Medical History:  Diagnosis Date   AAA (abdominal aortic aneurysm) without rupture 04/28/2017   Aorta US  1/18: 4.2 cm AAA   Bladder cancer (HCC) 2006   Cervicalgia    Coronary atherosclerosis of native coronary artery    stent x1   Diabetes mellitus without complication (HCC)    typ2   Esophageal reflux    Esophageal stricture    Heart murmur    slight   History of cardiovascular stress test    Lexiscan  Myoview  7/16: EF 66%, diaphragmatic attenuation, no ischemia, low risk   History of kidney stones    Hx of adenomatous colonic polyps    Hyperlipidemia    Hypertension    Myocardial infarct [facility])    2007   Unspecified hypothyroidism    Assessment: Patient is a 78 year old male with a history of Afib on eliquis . He presents with chest pain. Pharmacy is consulted for heparin for ACS and Afib.   CBC WNL, no bleeding noted  PM: aPTT within goal on 1100 units/hr. Per RN, no signs/symptoms of bleeding.  Goal of Therapy:  Heparin level 0.3-0.7 units/ml Monitor platelets by anticoagulation protocol: Yes   Plan:  Continue heparin infusion at 1100  units/hr Confirmatory aPTT in 8 hours Follow daily heparin level for correlation, daily CBC Follow up Eliquis  restart   Lynwood Poplar, PharmD, BCPS Clinical Pharmacist 11/05/2024 11:51 PM

## 2024-11-05 NOTE — ED Provider Notes (Signed)
 [place] EMERGENCY DEPARTMENT AT [facility] Provider Note   CSN: [id number] Arrival date & time: 11/05/24  9050     Patient presents with: Chest Pain   HAMP MORELAND is a 78 y.o. male.   78 year old presenting with central chest pain.  Started while watching news.  No associated shortness of breath, nausea or diaphoresis.  Has a history of prior MI.  Was given aspirin nitro prior to arrival.  Describes pain as dull, radiating towards his upper arms and back.   Chest Pain      Prior to Admission medications   Medication Sig Start Date End Date Taking? Authorizing Provider  apixaban  (ELIQUIS ) 5 MG TABS tablet TAKE 1 TABLET(5 MG) BY MOUTH TWICE DAILY 09/29/24  Yes Cooper, Michael, MD  atorvastatin (LIPITOR) 40 MG tablet Take 40 mg by mouth daily.   Yes [provider]  empagliflozin (JARDIANCE) 10 MG TABS tablet Take 10 mg by mouth daily.    Yes [provider]  hydrochlorothiazide  (MICROZIDE ) 12.5 MG capsule Take 1 capsule (12.5 mg total) by mouth daily. 10/26/24 01/24/25 Yes Parthenia Olivia HERO, PA-C  insulin  glargine (LANTUS SOLOSTAR) 100 UNIT/ML Solostar Pen Inject 25 Units into the skin at bedtime.   Yes [provider]  insulin  lispro (HUMALOG) 100 UNIT/ML injection Inject 6-14 Units into the skin 3 (three) times daily with meals.   Yes [provider]  levothyroxine (SYNTHROID) 100 MCG tablet Take 100 mcg by mouth daily. 03/10/24  Yes [provider]  lisinopril  (ZESTRIL ) 20 MG tablet TAKE 1 TABLET BY MOUTH EVERY DAY 01/02/22  Yes Wonda Sharper, MD  Magnesium  500 MG CAPS Take 500 mg by mouth daily.   Yes [provider]  metFORMIN (GLUCOPHAGE) 500 MG tablet Take 500 mg by mouth daily. 08/28/16  Yes [provider]  nitroGLYCERIN  (NITROSTAT ) 0.4 MG SL tablet Place 0.4 mg under the tongue every 5 (five) minutes as needed for chest pain.   Yes [provider]  omeprazole (PRILOSEC) 20 MG capsule Take  20 mg by mouth daily.   Yes [provider]  Turmeric (QC TUMERIC COMPLEX PO) Take 1 capsule by mouth daily.   Yes [provider]  Continuous Blood Gluc Sensor (FREESTYLE LIBRE 2 SENSOR) MISC CHANGE EVERY 14 DAYS TO MONITOR BLOOD GLUCOSE CONTINUOUSLY 08/24/20   [provider]  diltiazem  (CARDIZEM  CD) 120 MG 24 hr capsule TAKE 1 CAPSULE(120 MG) BY MOUTH DAILY Patient not taking: No sig reported 07/08/24   Wonda Sharper, MD    Allergies: Quinolones    Review of Systems  Cardiovascular:  Positive for chest pain.    Updated Vital Signs BP (!) 139/94   Pulse (!) 56   Temp 98 F (36.7 C) (Oral)   Resp 16   Ht 5' 10 (1.778 m)   Wt 74.8 kg   SpO2 100%   BMI 23.68 kg/m   Physical Exam Vitals and nursing note reviewed.  HENT:     Head: Normocephalic.  Cardiovascular:     Rate and Rhythm: Normal rate and regular rhythm.     Pulses:          Radial pulses are 2+ on the right side and 2+ on the left side.       Posterior tibial pulses are 2+ on the right side and 2+ on the left side.     Heart sounds: Normal heart sounds.  Pulmonary:     Effort: Pulmonary effort is normal.  Breath sounds: Normal breath sounds.  Abdominal:     Palpations: Abdomen is soft.     Tenderness: There is no abdominal tenderness.  Musculoskeletal:     Right lower leg: No edema.     Left lower leg: No edema.  Skin:    General: Skin is warm and dry.     Capillary Refill: Capillary refill takes less than 2 seconds.  Neurological:     Mental Status: He is alert and oriented to person, place, and time.  Psychiatric:        Mood and Affect: Mood normal.        Behavior: Behavior normal.     (all labs ordered are listed, but only abnormal results are displayed) Labs Reviewed  BASIC METABOLIC PANEL WITH GFR - Abnormal; Notable for the following components:      Result Value   Sodium 133 (*)    Potassium 6.0 (*)    Chloride 96 (*)    Glucose, Bld 377 (*)    Creatinine,  Ser 1.52 (*)    Calcium 8.2 (*)    GFR, Estimated 47 (*)    All other components within normal limits  HEPATIC FUNCTION PANEL - Abnormal; Notable for the following components:   Total Protein 6.0 (*)    Total Bilirubin 2.1 (*)    Bilirubin, Direct 0.4 (*)    Indirect Bilirubin 1.7 (*)    All other components within normal limits  CBG MONITORING, ED - Abnormal; Notable for the following components:   Glucose-Capillary 326 (*)    All other components within normal limits  CBG MONITORING, ED - Abnormal; Notable for the following components:   Glucose-Capillary 259 (*)    All other components within normal limits  TROPONIN I (HIGH SENSITIVITY) - Abnormal; Notable for the following components:   Troponin I (High Sensitivity) 41 (*)    All other components within normal limits  TROPONIN I (HIGH SENSITIVITY) - Abnormal; Notable for the following components:   Troponin I (High Sensitivity) 76 (*)    All other components within normal limits  CBC  LIPASE, BLOOD  POTASSIUM  APTT    EKG: EKG Interpretation Date/Time:  Thursday November 05 2024 09:56:59 EST Ventricular Rate:  122 PR Interval:  160 QRS Duration:  70 QT Interval:  252 QTC Calculation: 359 R Axis:   -37  Text Interpretation: Sinus tachycardia with frequent Premature ventricular complexes in a pattern of bigeminy Left axis deviation Low voltage QRS Nonspecific ST and T wave abnormality Abnormal ECG When compared with ECG of 05-Oct-2024 09:55, PREVIOUS ECG IS PRESENT Confirmed by Neysa Clap [phone]) on 11/05/2024 11:26:17 AM  Radiology: CT Angio Chest Aorta W and/or Wo Contrast Result Date: 11/05/2024 EXAM: CTA CHEST 11/05/2024 11:54:58 AM TECHNIQUE: CTA of the chest was performed after the administration of intravenous contrast. Without and with IV contrast was administered, including 75 mL iohexol  (OMNIPAQUE ) 350 MG/ML injection. Multiplanar reformatted images are provided for review. MIP images are provided for review.  Automated exposure control, iterative reconstruction, and/or weight based adjustment of the mA/kV was utilized to reduce the radiation dose to as low as reasonably achievable. COMPARISON: 10/23/2024 CLINICAL HISTORY: Acute aortic syndrome (AAS) suspected FINDINGS: PULMONARY ARTERIES: Pulmonary arteries are adequately opacified for evaluation. No acute pulmonary embolus. Main pulmonary artery is normal in caliber. MEDIASTINUM: The heart and pericardium demonstrate no acute abnormality. There is a 4.4 cm ascending thoracic aortic aneurysm. Aortic atherosclerosis is present. No dissection is noted. LYMPH NODES: No mediastinal, hilar  or axillary lymphadenopathy. LUNGS AND PLEURA: The lungs are without acute process. No focal consolidation or pulmonary edema. No evidence of pleural effusion or pneumothorax. UPPER ABDOMEN: Limited images of the upper abdomen are unremarkable. SOFT TISSUES AND BONES: No acute bone or soft tissue abnormality. IMPRESSION: 1. No evidence of acute aortic syndrome. 2. Stable ascending thoracic aortic aneurysm measuring 4.4 cm. 3. Aortic atherosclerosis. Electronically signed by: Lynwood Seip MD 11/05/2024 01:17 PM EST RP Workstation: [id number]   DG Chest 2 View Result Date: 11/05/2024 EXAM: 2 VIEW(S) XRAY OF THE CHEST 11/05/2024 10:14:00 AM COMPARISON: 06/25/2015 CLINICAL HISTORY: CP FINDINGS: LUNGS AND PLEURA: Hyperinflated lungs. No focal pulmonary opacity. No pulmonary edema. No pleural effusion. No pneumothorax. HEART AND MEDIASTINUM: No acute abnormality of the cardiac and mediastinal silhouettes. BONES AND SOFT TISSUES: Punctate radiopaque foreign bodies over left chest. No acute osseous abnormality. IMPRESSION: 1. No acute cardiopulmonary process. 2. Hyperinflated lungs. Electronically signed by: Norleen Boxer MD 11/05/2024 11:08 AM EST RP Workstation: [id number]     .Critical Care  Performed by: Neysa Caron PARAS, DO Authorized by: Neysa Caron PARAS, DO   Critical care provider  statement:    Critical care time (minutes):  30   Critical care was necessary to treat or prevent imminent or life-threatening deterioration of the following conditions:  Metabolic crisis   Critical care was time spent personally by me on the following activities:  Development of treatment plan with patient or surrogate, discussions with consultants, evaluation of patient's response to treatment, examination of patient, ordering and review of laboratory studies, ordering and review of radiographic studies, ordering and performing treatments and interventions, pulse oximetry, re-evaluation of patient's condition and review of old charts    Medications Ordered in the ED  heparin ADULT infusion 100 units/mL (25000 units/250mL) (1,100 Units/hr Intravenous New Bag/Given 11/05/24 1456)  aspirin EC tablet 81 mg (has no administration in time range)  nitroGLYCERIN  (NITROSTAT ) SL tablet 0.4 mg (has no administration in time range)  acetaminophen  (TYLENOL ) tablet 650 mg (has no administration in time range)  ondansetron  (ZOFRAN ) injection 4 mg (has no administration in time range)  atorvastatin (LIPITOR) tablet 40 mg (has no administration in time range)  hydrochlorothiazide  (MICROZIDE ) capsule 12.5 mg (has no administration in time range)  levothyroxine (SYNTHROID) tablet 100 mcg (has no administration in time range)  pantoprazole (PROTONIX) EC tablet 40 mg (has no administration in time range)  morphine (PF) 2 MG/ML injection 2 mg (2 mg Intravenous Given 11/05/24 1048)  sodium zirconium cyclosilicate (LOKELMA) packet 10 g (10 g Oral Given 11/05/24 1231)  lactated ringers  bolus 1,000 mL (0 mLs Intravenous Stopped 11/05/24 1451)  furosemide (LASIX) injection 20 mg (20 mg Intravenous Given 11/05/24 1137)  calcium gluconate inj 10% (1 g) URGENT USE ONLY! (1 g Intravenous Given 11/05/24 1137)  iohexol  (OMNIPAQUE ) 350 MG/ML injection 75 mL (75 mLs Intravenous Contrast Given 11/05/24 1151)    Clinical Course as of  11/05/24 1557  Thu Nov 05, 2024  1016 Saw cardiology on 10/26/24: '78 y.o. 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 [TY]  1029 Had CTA aorta chest on 10/24: IMPRESSION: 1. Mild to moderate calcific atherosclerosis of the thoracic aorta and great vessels without stenosis or dissection. 2. Mild dilation of the mid ascending aorta measuring up to 4.4 cm, minimally changed from prior measurement of 4.3 cm. Annual CTA or MRA follow-up recommended.  3. 3 vessel Coronary artery disease with prior lad stenting. 4. Mild centrilobular emphysema. [TY]  1125 Potassium(!): 6.0 [TY]  1125 Basic metabolic panel(!) Hyperglycemic, but not in DKA.  Potassium of 6.  EKG does have some slight peaking of his T waves. Will give meds for hyperK [TY]  1330 CT Angio Chest Aorta W and/or Wo Contrast MPRESSION: 1. No evidence of acute aortic syndrome. 2. Stable ascending thoracic aortic aneurysm measuring 4.4 cm. 3. Aortic atherosclerosis.  Electronically signed by: Lynwood Seip MD 11/05/2024 01:17 PM EST RP Workstation: [id number]   [TY]  1557 Patient with persistently elevated troponins.  Discussed with cardiology  who will evaluate patient.  Given patient's comorbidities elevated troponin and potassium of 6 will admit for chest pain evaluation/workup and monitoring of electrolytes. [TY]    Clinical Course User Index [TY] Neysa Caron PARAS, DO                                 Medical Decision Making This is a 78 year old male presenting emergency department for chest pain.  He is afebrile nontachycardic, normotensive on arrival.  EKG on my independent interpretation without STEMI.  Per chart review recently saw cardiology; see ED course.  Will get broad cardiac screening labs.  Does have a history of aortic aneurysm.  Did have a CTA of his chest recently.   However given his new chest pains radiating towards his back, will repeat to exclude dissection.  Offered pain medications, but patient declined.  Amount and/or Complexity of Data Reviewed Independent Historian:     Details: Notes prior MI in 2007 External Data Reviewed:     Details: See ED course Labs: ordered. Decision-making details documented in ED Course. Radiology: ordered and independent interpretation performed. Decision-making details documented in ED Course. ECG/medicine tests: independent interpretation performed.  Risk Prescription drug management. Decision regarding hospitalization.        Final diagnoses:  Chest pain, unspecified type  Hyperkalemia    ED Discharge Orders     None          Neysa Caron PARAS, DO 11/05/24 1557

## 2024-11-06 ENCOUNTER — Encounter (HOSPITAL_COMMUNITY)
Admission: EM | Disposition: A | Discharge status: Home / Self Care | Payer: Self-pay | Source: Home / Self Care | Attending: Thoracic Surgery (Cardiothoracic Vascular Surgery)

## 2024-11-06 ENCOUNTER — Observation Stay (HOSPITAL_COMMUNITY)

## 2024-11-06 DIAGNOSIS — J432 Centrilobular emphysema: Secondary | ICD-10-CM | POA: Diagnosis present

## 2024-11-06 DIAGNOSIS — D62 Acute posthemorrhagic anemia: Secondary | ICD-10-CM | POA: Diagnosis not present

## 2024-11-06 DIAGNOSIS — R7989 Other specified abnormal findings of blood chemistry: Secondary | ICD-10-CM | POA: Diagnosis not present

## 2024-11-06 DIAGNOSIS — I25110 Atherosclerotic heart disease of native coronary artery with unstable angina pectoris: Secondary | ICD-10-CM | POA: Diagnosis not present

## 2024-11-06 DIAGNOSIS — Z951 Presence of aortocoronary bypass graft: Secondary | ICD-10-CM

## 2024-11-06 DIAGNOSIS — I25118 Atherosclerotic heart disease of native coronary artery with other forms of angina pectoris: Secondary | ICD-10-CM | POA: Diagnosis not present

## 2024-11-06 DIAGNOSIS — E1151 Type 2 diabetes mellitus with diabetic peripheral angiopathy without gangrene: Secondary | ICD-10-CM | POA: Diagnosis present

## 2024-11-06 DIAGNOSIS — I480 Paroxysmal atrial fibrillation: Secondary | ICD-10-CM

## 2024-11-06 DIAGNOSIS — I482 Chronic atrial fibrillation, unspecified: Secondary | ICD-10-CM | POA: Diagnosis present

## 2024-11-06 DIAGNOSIS — I2510 Atherosclerotic heart disease of native coronary artery without angina pectoris: Secondary | ICD-10-CM | POA: Diagnosis not present

## 2024-11-06 DIAGNOSIS — I252 Old myocardial infarction: Secondary | ICD-10-CM | POA: Diagnosis not present

## 2024-11-06 DIAGNOSIS — I77810 Thoracic aortic ectasia: Secondary | ICD-10-CM | POA: Diagnosis not present

## 2024-11-06 DIAGNOSIS — J449 Chronic obstructive pulmonary disease, unspecified: Secondary | ICD-10-CM | POA: Diagnosis not present

## 2024-11-06 DIAGNOSIS — N1831 Chronic kidney disease, stage 3a: Secondary | ICD-10-CM | POA: Diagnosis present

## 2024-11-06 DIAGNOSIS — I714 Abdominal aortic aneurysm, without rupture, unspecified: Secondary | ICD-10-CM

## 2024-11-06 DIAGNOSIS — I083 Combined rheumatic disorders of mitral, aortic and tricuspid valves: Secondary | ICD-10-CM | POA: Diagnosis not present

## 2024-11-06 DIAGNOSIS — N179 Acute kidney failure, unspecified: Secondary | ICD-10-CM | POA: Diagnosis present

## 2024-11-06 DIAGNOSIS — E11649 Type 2 diabetes mellitus with hypoglycemia without coma: Secondary | ICD-10-CM | POA: Diagnosis present

## 2024-11-06 DIAGNOSIS — E785 Hyperlipidemia, unspecified: Secondary | ICD-10-CM

## 2024-11-06 DIAGNOSIS — I129 Hypertensive chronic kidney disease with stage 1 through stage 4 chronic kidney disease, or unspecified chronic kidney disease: Secondary | ICD-10-CM | POA: Diagnosis present

## 2024-11-06 DIAGNOSIS — E039 Hypothyroidism, unspecified: Secondary | ICD-10-CM | POA: Diagnosis present

## 2024-11-06 DIAGNOSIS — I214 Non-ST elevation (NSTEMI) myocardial infarction: Secondary | ICD-10-CM | POA: Diagnosis present

## 2024-11-06 DIAGNOSIS — E1159 Type 2 diabetes mellitus with other circulatory complications: Secondary | ICD-10-CM | POA: Diagnosis not present

## 2024-11-06 DIAGNOSIS — C679 Malignant neoplasm of bladder, unspecified: Secondary | ICD-10-CM

## 2024-11-06 DIAGNOSIS — T82855A Stenosis of coronary artery stent, initial encounter: Secondary | ICD-10-CM | POA: Diagnosis present

## 2024-11-06 DIAGNOSIS — Z7901 Long term (current) use of anticoagulants: Secondary | ICD-10-CM | POA: Diagnosis not present

## 2024-11-06 DIAGNOSIS — I7121 Aneurysm of the ascending aorta, without rupture: Secondary | ICD-10-CM | POA: Diagnosis not present

## 2024-11-06 DIAGNOSIS — J90 Pleural effusion, not elsewhere classified: Secondary | ICD-10-CM | POA: Diagnosis not present

## 2024-11-06 DIAGNOSIS — Z794 Long term (current) use of insulin: Secondary | ICD-10-CM | POA: Diagnosis not present

## 2024-11-06 DIAGNOSIS — I10 Essential (primary) hypertension: Secondary | ICD-10-CM

## 2024-11-06 DIAGNOSIS — E1122 Type 2 diabetes mellitus with diabetic chronic kidney disease: Secondary | ICD-10-CM | POA: Diagnosis present

## 2024-11-06 DIAGNOSIS — Z789 Other specified health status: Secondary | ICD-10-CM | POA: Diagnosis present

## 2024-11-06 DIAGNOSIS — E1165 Type 2 diabetes mellitus with hyperglycemia: Secondary | ICD-10-CM | POA: Diagnosis present

## 2024-11-06 DIAGNOSIS — D72829 Elevated white blood cell count, unspecified: Secondary | ICD-10-CM | POA: Diagnosis present

## 2024-11-06 DIAGNOSIS — E875 Hyperkalemia: Secondary | ICD-10-CM | POA: Diagnosis present

## 2024-11-06 DIAGNOSIS — Z955 Presence of coronary angioplasty implant and graft: Secondary | ICD-10-CM

## 2024-11-06 DIAGNOSIS — K222 Esophageal obstruction: Secondary | ICD-10-CM

## 2024-11-06 DIAGNOSIS — I700 Atherosclerosis of aorta: Secondary | ICD-10-CM | POA: Diagnosis present

## 2024-11-06 DIAGNOSIS — J9811 Atelectasis: Secondary | ICD-10-CM | POA: Diagnosis present

## 2024-11-06 DIAGNOSIS — F05 Delirium due to known physiological condition: Secondary | ICD-10-CM | POA: Diagnosis present

## 2024-11-06 HISTORY — PX: LEFT HEART CATH AND CORONARY ANGIOGRAPHY: CATH118249

## 2024-11-06 LAB — LIPID PANEL
Cholesterol: 92 mg/dL (ref 0–200)
HDL: 35 mg/dL — ABNORMAL LOW (ref >40)
LDL Cholesterol: 42 mg/dL (ref 0–99)
Total CHOL/HDL Ratio: 2.6 ratio
Triglycerides: 75 mg/dL (ref <150)
VLDL: 15 mg/dL (ref 0–40)

## 2024-11-06 LAB — ECHOCARDIOGRAM COMPLETE
AR max vel: 3.16 cm2
AV Area VTI: 2.97 cm2
AV Area mean vel: 2.78 cm2
AV Mean grad: 3 mmHg
AV Peak grad: 5.3 mmHg
Ao pk vel: 1.15 m/s
Area-P 1/2: 2.46 cm2
Calc EF: 78.1 %
Height: 70 in
MV VTI: 2.63 cm2
S' Lateral: 2.6 cm
Single Plane A2C EF: 75.7 %
Single Plane A4C EF: 80.4 %
Weight: 2640 [oz_av]

## 2024-11-06 LAB — RAPID URINE DRUG SCREEN, HOSP PERFORMED
Amphetamines: NOT DETECTED
Barbiturates: NOT DETECTED
Benzodiazepines: NOT DETECTED
Cocaine: NOT DETECTED
Opiates: POSITIVE — AB
Tetrahydrocannabinol: NOT DETECTED

## 2024-11-06 LAB — BASIC METABOLIC PANEL WITH GFR
Anion gap: 11 (ref 5–15)
BUN: 15 mg/dL (ref 8–23)
CO2: 27 mmol/L (ref 22–32)
Calcium: 8.2 mg/dL — ABNORMAL LOW (ref 8.9–10.3)
Chloride: 101 mmol/L (ref 98–111)
Creatinine, Ser: 1.46 mg/dL — ABNORMAL HIGH (ref 0.61–1.24)
GFR, Estimated: 49 mL/min — ABNORMAL LOW (ref >60)
Glucose, Bld: 121 mg/dL — ABNORMAL HIGH (ref 70–99)
Potassium: 3.8 mmol/L (ref 3.5–5.1)
Sodium: 139 mmol/L (ref 135–145)

## 2024-11-06 LAB — CBC
HCT: 41.5 % (ref 39.0–52.0)
Hemoglobin: 14.3 g/dL (ref 13.0–17.0)
MCH: 32.8 pg (ref 26.0–34.0)
MCHC: 34.5 g/dL (ref 30.0–36.0)
MCV: 95.2 fL (ref 80.0–100.0)
Platelets: 171 K/uL (ref 150–400)
RBC: 4.36 MIL/uL (ref 4.22–5.81)
RDW: 12.3 % (ref 11.5–15.5)
WBC: 4.8 K/uL (ref 4.0–10.5)
nRBC: 0 % (ref 0.0–0.2)

## 2024-11-06 LAB — TYPE AND SCREEN
ABO/RH(D): O POS
Antibody Screen: NEGATIVE

## 2024-11-06 LAB — BLOOD GAS, ARTERIAL
Acid-Base Excess: 2.3 mmol/L — ABNORMAL HIGH (ref 0.0–2.0)
Bicarbonate: 26.5 mmol/L (ref 20.0–28.0)
Drawn by: 63876
O2 Saturation: 100 %
Patient temperature: 37.3
pCO2 arterial: 40 mmHg (ref 32–48)
pH, Arterial: 7.44 (ref 7.35–7.45)
pO2, Arterial: 91 mmHg (ref 83–108)

## 2024-11-06 LAB — GLUCOSE, CAPILLARY
Glucose-Capillary: 115 mg/dL — ABNORMAL HIGH (ref 70–99)
Glucose-Capillary: 58 mg/dL — ABNORMAL LOW (ref 70–99)
Glucose-Capillary: 70 mg/dL (ref 70–99)
Glucose-Capillary: 71 mg/dL (ref 70–99)
Glucose-Capillary: 80 mg/dL (ref 70–99)
Glucose-Capillary: 102 mg/dL — ABNORMAL HIGH (ref 70–99)

## 2024-11-06 LAB — HEMOGLOBIN A1C
Hgb A1c MFr Bld: 6.4 % — ABNORMAL HIGH (ref 4.8–5.6)
Mean Plasma Glucose: 136.98 mg/dL

## 2024-11-06 LAB — PROTIME-INR
INR: 1.2 (ref 0.8–1.2)
Prothrombin Time: 15.4 s — ABNORMAL HIGH (ref 11.4–15.2)

## 2024-11-06 LAB — ABO/RH: ABO/RH(D): O POS

## 2024-11-06 LAB — HEPARIN LEVEL (UNFRACTIONATED): Heparin Unfractionated: >1.1 [IU]/mL — ABNORMAL HIGH (ref 0.30–0.70)

## 2024-11-06 LAB — APTT: aPTT: 74 s — ABNORMAL HIGH (ref 24–36)

## 2024-11-06 SURGERY — LEFT HEART CATH AND CORONARY ANGIOGRAPHY
Anesthesia: LOCAL

## 2024-11-06 MED ORDER — GLUCOSE 40 % PO GEL
1.0000 | Freq: Once | ORAL | Status: AC
  Administered 2024-11-06: 31 g via ORAL

## 2024-11-06 MED ORDER — GLUCOSE 40 % PO GEL
ORAL | Status: AC
  Filled 2024-11-06: qty 1.21

## 2024-11-06 MED ORDER — IOHEXOL 350 MG/ML SOLN
INTRAVENOUS | Status: DC | PRN
  Administered 2024-11-06: 25 mL

## 2024-11-06 MED ORDER — CHLORHEXIDINE GLUCONATE CLOTH 2 % EX PADS: 6.0000 | MEDICATED_PAD | Freq: Once | CUTANEOUS | Status: DC

## 2024-11-06 MED ORDER — CHLORHEXIDINE GLUCONATE CLOTH 2 % EX PADS
6.0000 | MEDICATED_PAD | Freq: Once | CUTANEOUS | Status: AC
  Administered 2024-11-07: 6 via TOPICAL

## 2024-11-06 MED ORDER — VERAPAMIL HCL 2.5 MG/ML IV SOLN
INTRAVENOUS | Status: DC | PRN
  Administered 2024-11-06: 10 mL via INTRA_ARTERIAL

## 2024-11-06 MED ORDER — HEPARIN SODIUM (PORCINE) 1000 UNIT/ML IJ SOLN
INTRAMUSCULAR | Status: DC | PRN
  Administered 2024-11-06: 5000 [IU] via INTRAVENOUS

## 2024-11-06 MED ORDER — CEFAZOLIN SODIUM-DEXTROSE 2-4 GM/100ML-% IV SOLN
2.0000 g | INTRAVENOUS | Status: DC
  Filled 2024-11-06: qty 100

## 2024-11-06 MED ORDER — LIDOCAINE HCL (PF) 1 % IJ SOLN
INTRAMUSCULAR | Status: DC | PRN
  Administered 2024-11-06: 2 mL

## 2024-11-06 MED ORDER — MIDAZOLAM HCL (PF) 2 MG/2ML IJ SOLN
INTRAMUSCULAR | Status: DC | PRN
  Administered 2024-11-06: 1 mg via INTRAVENOUS

## 2024-11-06 MED ORDER — VERAPAMIL HCL 2.5 MG/ML IV SOLN
INTRAVENOUS | Status: AC
  Filled 2024-11-06: qty 2

## 2024-11-06 MED ORDER — NOREPINEPHRINE 4 MG/250ML-% IV SOLN
0.0000 ug/min | INTRAVENOUS | Status: DC
  Filled 2024-11-06: qty 250

## 2024-11-06 MED ORDER — EPINEPHRINE HCL 5 MG/250ML IV SOLN IN NS
0.0000 ug/min | INTRAVENOUS | Status: DC
  Filled 2024-11-06: qty 250

## 2024-11-06 MED ORDER — POTASSIUM CHLORIDE 2 MEQ/ML IV SOLN
80.0000 meq | INTRAVENOUS | Status: DC
  Filled 2024-11-06: qty 40

## 2024-11-06 MED ORDER — CHLORHEXIDINE GLUCONATE 0.12 % MT SOLN: 15.0000 mL | Freq: Once | OROMUCOSAL | Status: DC

## 2024-11-06 MED ORDER — MIDAZOLAM HCL 2 MG/2ML IJ SOLN
INTRAMUSCULAR | Status: AC
  Filled 2024-11-06: qty 2

## 2024-11-06 MED ORDER — HYDRALAZINE HCL 20 MG/ML IJ SOLN: 10.0000 mg | INTRAMUSCULAR | Status: AC | PRN

## 2024-11-06 MED ORDER — HEPARIN 30,000 UNITS/1000 ML (OHS) CELLSAVER SOLUTION
Status: DC
  Filled 2024-11-06: qty 1000

## 2024-11-06 MED ORDER — PHENYLEPHRINE HCL-NACL 20-0.9 MG/250ML-% IV SOLN
30.0000 ug/min | INTRAVENOUS | Status: DC
Start: 2024-11-06 — End: 2024-11-06
  Filled 2024-11-06: qty 250

## 2024-11-06 MED ORDER — TRANEXAMIC ACID (OHS) PUMP PRIME SOLUTION
2.0000 mg/kg | INTRAVENOUS | Status: DC
  Filled 2024-11-06: qty 1.5

## 2024-11-06 MED ORDER — BISACODYL 5 MG PO TBEC
5.0000 mg | DELAYED_RELEASE_TABLET | Freq: Once | ORAL | Status: AC
  Administered 2024-11-06: 5 mg via ORAL
  Filled 2024-11-06: qty 1

## 2024-11-06 MED ORDER — SODIUM CHLORIDE 0.9% FLUSH: 3.0000 mL | Freq: Two times a day (BID) | INTRAVENOUS | Status: DC

## 2024-11-06 MED ORDER — HEPARIN (PORCINE) 25000 UT/250ML-% IV SOLN: 1100.0000 [IU]/h | INTRAVENOUS | Status: DC

## 2024-11-06 MED ORDER — PLASMA-LYTE A IV SOLN
INTRAVENOUS | Status: AC
  Filled 2024-11-06: qty 2.5

## 2024-11-06 MED ORDER — SODIUM CHLORIDE 0.9% FLUSH: 3.0000 mL | INTRAVENOUS | Status: DC | PRN

## 2024-11-06 MED ORDER — CHLORHEXIDINE GLUCONATE CLOTH 2 % EX PADS
6.0000 | MEDICATED_PAD | Freq: Once | CUTANEOUS | Status: AC
  Administered 2024-11-06: 6 via TOPICAL

## 2024-11-06 MED ORDER — TRANEXAMIC ACID 1000 MG/10ML IV SOLN
1.5000 mg/kg/h | INTRAVENOUS | Status: DC
  Filled 2024-11-06: qty 25

## 2024-11-06 MED ORDER — MILRINONE LACTATE IN DEXTROSE 20-5 MG/100ML-% IV SOLN
0.3000 ug/kg/min | INTRAVENOUS | Status: DC
  Filled 2024-11-06: qty 100

## 2024-11-06 MED ORDER — LABETALOL HCL 5 MG/ML IV SOLN: 10.0000 mg | INTRAVENOUS | Status: AC | PRN

## 2024-11-06 MED ORDER — VANCOMYCIN HCL 1250 MG/250ML IV SOLN
1250.0000 mg | INTRAVENOUS | Status: DC
  Filled 2024-11-06: qty 250

## 2024-11-06 MED ORDER — TRANEXAMIC ACID (OHS) BOLUS VIA INFUSION
15.0000 mg/kg | INTRAVENOUS | Status: AC
  Administered 2024-11-07: 1122 mg via INTRAVENOUS
  Filled 2024-11-06: qty 1122

## 2024-11-06 MED ORDER — FENTANYL CITRATE (PF) 100 MCG/2ML IJ SOLN
INTRAMUSCULAR | Status: DC | PRN
  Administered 2024-11-06: 25 ug via INTRAVENOUS

## 2024-11-06 MED ORDER — SODIUM CHLORIDE 0.9% FLUSH
3.0000 mL | Freq: Two times a day (BID) | INTRAVENOUS | Status: DC
  Administered 2024-11-06: 3 mL via INTRAVENOUS

## 2024-11-06 MED ORDER — VANCOMYCIN HCL 1250 MG/250ML IV SOLN
1250.0000 mg | INTRAVENOUS | Status: AC
  Administered 2024-11-07: 1250 mg via INTRAVENOUS
  Filled 2024-11-06 (×2): qty 250

## 2024-11-06 MED ORDER — TRANEXAMIC ACID (OHS) BOLUS VIA INFUSION
15.0000 mg/kg | INTRAVENOUS | Status: DC
  Filled 2024-11-06: qty 1122

## 2024-11-06 MED ORDER — MANNITOL 20 % IV SOLN
INTRAVENOUS | Status: DC
  Filled 2024-11-06 (×2): qty 13

## 2024-11-06 MED ORDER — GLUCOSE 40 % PO GEL
ORAL | Status: AC
Start: 2024-11-06 — End: 2024-11-06
  Filled 2024-11-06: qty 1.21

## 2024-11-06 MED ORDER — FENTANYL CITRATE (PF) 100 MCG/2ML IJ SOLN
INTRAMUSCULAR | Status: AC
  Filled 2024-11-06: qty 2

## 2024-11-06 MED ORDER — METOPROLOL TARTRATE 12.5 MG HALF TABLET: 12.5000 mg | ORAL_TABLET | Freq: Once | ORAL | Status: DC

## 2024-11-06 MED ORDER — PLASMA-LYTE A IV SOLN
INTRAVENOUS | Status: DC
  Filled 2024-11-06: qty 2.5

## 2024-11-06 MED ORDER — METOPROLOL TARTRATE 12.5 MG HALF TABLET
12.5000 mg | ORAL_TABLET | Freq: Once | ORAL | Status: AC
  Administered 2024-11-07: 12.5 mg via ORAL
  Filled 2024-11-06: qty 1

## 2024-11-06 MED ORDER — LIDOCAINE HCL (PF) 1 % IJ SOLN
INTRAMUSCULAR | Status: AC
Start: 2024-11-06 — End: 2024-11-06
  Filled 2024-11-06: qty 30

## 2024-11-06 MED ORDER — HEPARIN SODIUM (PORCINE) 1000 UNIT/ML IJ SOLN
INTRAMUSCULAR | Status: AC
Start: 2024-11-06 — End: 2024-11-06
  Filled 2024-11-06: qty 10

## 2024-11-06 MED ORDER — INSULIN REGULAR(HUMAN) IN NACL 100-0.9 UT/100ML-% IV SOLN
INTRAVENOUS | Status: DC
  Filled 2024-11-06: qty 100

## 2024-11-06 MED ORDER — ASPIRIN 81 MG PO CHEW: 81.0000 mg | CHEWABLE_TABLET | Freq: Every day | ORAL | Status: DC

## 2024-11-06 MED ORDER — BISACODYL 5 MG PO TBEC: 5.0000 mg | DELAYED_RELEASE_TABLET | Freq: Once | ORAL | Status: DC

## 2024-11-06 MED ORDER — CHLORHEXIDINE GLUCONATE 0.12 % MT SOLN
15.0000 mL | Freq: Once | OROMUCOSAL | Status: AC
  Administered 2024-11-07: 15 mL via OROMUCOSAL
  Filled 2024-11-06: qty 15

## 2024-11-06 MED ORDER — HYDRALAZINE HCL 20 MG/ML IJ SOLN: 10.0000 mg | INTRAMUSCULAR | Status: DC | PRN

## 2024-11-06 MED ORDER — HEPARIN (PORCINE) IN NACL 1000-0.9 UT/500ML-% IV SOLN
INTRAVENOUS | Status: DC | PRN
  Administered 2024-11-06: 1000 mL

## 2024-11-06 MED ORDER — INSULIN REGULAR(HUMAN) IN NACL 100-0.9 UT/100ML-% IV SOLN
INTRAVENOUS | Status: AC
  Administered 2024-11-07: 3.6 [IU]/h via INTRAVENOUS
  Filled 2024-11-06: qty 100

## 2024-11-06 MED ORDER — PHENYLEPHRINE HCL-NACL 20-0.9 MG/250ML-% IV SOLN
30.0000 ug/min | INTRAVENOUS | Status: DC
Start: 2024-11-07 — End: 2024-11-07
  Filled 2024-11-06: qty 250

## 2024-11-06 MED ORDER — TRANEXAMIC ACID 1000 MG/10ML IV SOLN
1.5000 mg/kg/h | INTRAVENOUS | Status: AC
  Administered 2024-11-07: 1.5 mg/kg/h via INTRAVENOUS
  Filled 2024-11-06: qty 25

## 2024-11-06 MED ORDER — SODIUM CHLORIDE 0.9 % IV SOLN: 250.0000 mL | INTRAVENOUS | Status: DC | PRN

## 2024-11-06 MED ORDER — MANNITOL 20 % IV SOLN
INTRAVENOUS | Status: DC
  Filled 2024-11-06: qty 13

## 2024-11-06 MED ORDER — NITROGLYCERIN IN D5W 200-5 MCG/ML-% IV SOLN
2.0000 ug/min | INTRAVENOUS | Status: DC
  Filled 2024-11-06: qty 250

## 2024-11-06 MED ORDER — DEXMEDETOMIDINE HCL IN NACL 400 MCG/100ML IV SOLN
0.1000 ug/kg/h | INTRAVENOUS | Status: AC
Start: 2024-11-07 — End: 2024-11-07
  Administered 2024-11-07: .7 ug/kg/h via INTRAVENOUS
  Filled 2024-11-06: qty 100

## 2024-11-06 MED ORDER — DEXMEDETOMIDINE HCL IN NACL 400 MCG/100ML IV SOLN
0.1000 ug/kg/h | INTRAVENOUS | Status: DC
Start: 2024-11-06 — End: 2024-11-06
  Filled 2024-11-06: qty 100

## 2024-11-06 MED ORDER — CEFAZOLIN SODIUM-DEXTROSE 2-4 GM/100ML-% IV SOLN
2.0000 g | INTRAVENOUS | Status: AC
  Administered 2024-11-07 (×2): 2 g via INTRAVENOUS
  Filled 2024-11-06: qty 100

## 2024-11-06 MED ORDER — NITROGLYCERIN IN D5W 200-5 MCG/ML-% IV SOLN
2.0000 ug/min | INTRAVENOUS | Status: DC
Start: 2024-11-07 — End: 2024-11-07
  Filled 2024-11-06: qty 250

## 2024-11-06 SURGICAL SUPPLY — 6 items
CATH INFINITI 5FR ANG PIGTAIL (CATHETERS) IMPLANT
CATH INFINITI AMBI 6FR TG (CATHETERS) IMPLANT
GLIDESHEATH SLEND SS 6F .021 (SHEATH) IMPLANT
PACK CARDIAC CATHETERIZATION (CUSTOM PROCEDURE TRAY) ×2 IMPLANT
SET ATX-X65L (MISCELLANEOUS) IMPLANT
WIRE EMERALD 3MM-J .035X260CM (WIRE) IMPLANT

## 2024-11-06 NOTE — H&P (View-Only) (Signed)
 Reason for Consult:severe LAD disease Referring Physician: Dr. Wendel, Frederick Miles is an 78 y.o. male.  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  Has been having mild chest discomfort recently usually with activity.  Recently began having symptoms at rest.  On 11/6 had an episode watching the news.  Brought to ED by EMS.  Troponin mildly elevated.  Cath this evening revealed severe, 99 %, stenosis of proximal LAD at edge of stent.  Currently pain free.  Past Medical History:  Diagnosis Date   AAA (abdominal aortic aneurysm) without rupture 04/28/2017   Aorta US  1/18: 4.2 cm AAA   Bladder cancer (HCC) 2006   Cervicalgia    Coronary atherosclerosis of native coronary artery    stent x1   Diabetes mellitus without complication (HCC)    typ2   Esophageal reflux    Esophageal stricture    Heart murmur    slight   History of cardiovascular stress test    Lexiscan  Myoview  7/16: EF 66%, diaphragmatic attenuation, no ischemia, low risk   History of kidney stones    Hx of adenomatous colonic polyps    Hyperlipidemia    Hypertension    Myocardial infarct [facility])    2007   Unspecified hypothyroidism     Past Surgical History:  Procedure Laterality Date   BIOPSY  12/07/2021   Procedure: BIOPSY;  Surgeon: Teressa Toribio SQUIBB, MD;  Location: [facility];  Service: Endoscopy;;   COLONOSCOPY     CORONARY ANGIOPLASTY WITH STENT PLACEMENT  2007   CYSTOSCOPY/RETROGRADE/URETEROSCOPY/STONE EXTRACTION WITH BASKET  08/08/2012   Procedure: CYSTOSCOPY/RETROGRADE/URETEROSCOPY/STONE EXTRACTION WITH BASKET;  Surgeon: Norleen JINNY Seltzer, MD;  Location: [facility];  Service: Urology;  Laterality: Left;  Left Ureteroscopy with Stone Extraction   ESOPHAGOGASTRODUODENOSCOPY (EGD) WITH PROPOFOL  N/A 12/07/2021    Procedure: ESOPHAGOGASTRODUODENOSCOPY (EGD) WITH PROPOFOL ;  Surgeon: Teressa Toribio SQUIBB, MD;  Location: [facility];  Service: Endoscopy;  Laterality: N/A;   EUS N/A 12/07/2021   Procedure: UPPER ENDOSCOPIC ULTRASOUND (EUS) RADIAL;  Surgeon: Teressa Toribio SQUIBB, MD;  Location: [facility];  Service: Endoscopy;  Laterality: N/A;   EUS N/A 12/07/2021   Procedure: UPPER ENDOSCOPIC ULTRASOUND (EUS) LINEAR;  Surgeon: Teressa Toribio SQUIBB, MD;  Location: [facility];  Service: Endoscopy;  Laterality: N/A;   FINE NEEDLE ASPIRATION N/A 12/07/2021   Procedure: FINE NEEDLE ASPIRATION (FNA) LINEAR;  Surgeon: Teressa Toribio SQUIBB, MD;  Location: [facility];  Service: Endoscopy;  Laterality: N/A;   SKIN TAG REMOVAL  07/09/06   right anterior thigh   TONSILLECTOMY AND ADENOIDECTOMY     TRANSURETHRAL RESECTION OF BLADDER  07/09/06   tumor 2-5 cm   URETEROSCOPY WITH HOLMIUM LASER LITHOTRIPSY Left 01/06/2019   Procedure: LEFT URETEROSCOPY left retrograde pylegram basket extraction stone bladder biopsy fulgeration;  Surgeon: Seltzer Norleen, MD;  Location: [facility];  Service: Urology;  Laterality: Left;    Family History  Problem Relation Age of Onset   Heart disease Father    Heart attack Father    Heart disease Mother    Heart attack Paternal Grandfather    Heart attack Paternal Uncle    Colon cancer Neg Hx    Stroke Neg Hx    Diabetes Neg Hx    Stomach cancer Neg Hx    Rectal cancer Neg Hx     Social History:  reports that he quit smoking about 45 years ago. His smoking use included cigarettes. He has never used smokeless tobacco. He reports current alcohol use of about 5.0 standard drinks of alcohol per week. He reports that he does not use drugs.  Allergies:  Allergies  Allergen Reactions   Quinolones Other (See Comments)     Unknown    Medications: Scheduled:  aspirin  81 mg Oral Daily   aspirin EC  81 mg Oral Daily   atorvastatin  40 mg Oral Daily   insulin  aspart  0-15 Units Subcutaneous TID WC    insulin  aspart  0-5 Units Subcutaneous QHS   insulin  glargine-yfgn  20 Units Subcutaneous QHS   levothyroxine  100 mcg Oral Daily   pantoprazole  40 mg Oral Daily   sodium chloride  flush  3 mL Intravenous Q12H    Results for orders placed or performed during the hospital encounter of 11/05/24 (from the past 48 hours)  Basic metabolic panel     Status: Abnormal   Collection Time: 11/05/24  9:59 AM  Result Value Ref Range   Sodium 133 (L) 135 - 145 mmol/L   Potassium 6.0 (H) 3.5 - 5.1 mmol/L   Chloride 96 (L) 98 - 111 mmol/L   CO2 23 22 - 32 mmol/L   Glucose, Bld 377 (H) 70 - 99 mg/dL    Comment: Glucose reference range applies only to samples taken after fasting for at least 8 hours.   BUN 18 8 - 23 mg/dL   Creatinine, Ser 8.47 (H) 0.61 - 1.24 mg/dL   Calcium 8.2 (L) 8.9 - 10.3 mg/dL   GFR, Estimated 47 (L) >60 mL/min    Comment: (NOTE) Calculated using the CKD-EPI Creatinine Equation (2021)    Anion gap 14 5 - 15    Comment: Performed at [facility], [address], [place], [place] [address]  CBC     Status: None   Collection Time: 11/05/24  9:59 AM  Result Value Ref Range   WBC 7.3 4.0 - 10.5 K/uL   RBC 4.44 4.22 - 5.81 MIL/uL   Hemoglobin 14.6 13.0 - 17.0 g/dL   HCT 56.8 60.9 - 47.9 %   MCV 97.1 80.0 - 100.0 fL   MCH 32.9 26.0 - 34.0 pg   MCHC 33.9 30.0 - 36.0 g/dL   RDW 87.6 88.4 - 84.4 %   Platelets 177 150 - 400 K/uL   nRBC 0.0 0.0 - 0.2 %    Comment: Performed at [facility], [address] [place], [place] [address]  Troponin I (High Sensitivity)     Status: Abnormal   Collection Time: 11/05/24  9:59 AM  Result Value Ref Range   Troponin I (High Sensitivity) 41 (H) <18 ng/L    Comment: (NOTE) Elevated high sensitivity troponin I (hsTnI) values and significant  changes across serial measurements may suggest ACS but many other  chronic and acute conditions are known to elevate hsTnI results.  Refer to the Links section for chest pain algorithms  and additional  guidance. Performed at [facility], [address], [place], [place] [address]   Hepatic function panel     Status: Abnormal   Collection Time: 11/05/24  9:59 AM  Result Value Ref Range   Total Protein 6.0 (L) 6.5 - 8.1 g/dL   Albumin 3.9 3.5 - 5.0 g/dL   AST 27 15 - 41 U/L   ALT 26 0 - 44 U/L  Alkaline Phosphatase 82 38 - 126 U/L   Total Bilirubin 2.1 (H) 0.0 - 1.2 mg/dL   Bilirubin, Direct 0.4 (H) 0.0 - 0.2 mg/dL   Indirect Bilirubin 1.7 (H) 0.3 - 0.9 mg/dL    Comment: Performed at [facility], [address], [place], [place] [address]  Lipase, blood     Status: None   Collection Time: 11/05/24  9:59 AM  Result Value Ref Range   Lipase 28 11 - 51 U/L    Comment: Performed at [facility], [address] [place], [place] [address]  Troponin I (High Sensitivity)     Status: Abnormal   Collection Time: 11/05/24 12:53 PM  Result Value Ref Range   Troponin I (High Sensitivity) 76 (H) <18 ng/L    Comment: RESULT CALLED TO, READ BACK BY AND VERIFIED WITH MADELINE HARDY,RN AT 1358 11/05/2024 BY ZBEECH. (NOTE) Elevated high sensitivity troponin I (hsTnI) values and significant  changes across serial measurements may suggest ACS but many other  chronic and acute conditions are known to elevate hsTnI results.  Refer to the Links section for chest pain algorithms and additional  guidance. Performed at [facility], [address], [place], [place] [address]   CBG monitoring, ED     Status: Abnormal   Collection Time: 11/05/24  1:19 PM  Result Value Ref Range   Glucose-Capillary 326 (H) 70 - 99 mg/dL    Comment: Glucose reference range applies only to samples taken after fasting for at least 8 hours.  CBG monitoring, ED     Status: Abnormal   Collection Time: 11/05/24  3:50 PM  Result Value Ref Range   Glucose-Capillary 259 (H) 70 - 99 mg/dL    Comment: Glucose reference range applies only to samples taken after fasting for at least 8  hours.  Basic metabolic panel     Status: Abnormal   Collection Time: 11/05/24  6:29 PM  Result Value Ref Range   Sodium 139 135 - 145 mmol/L   Potassium 4.2 3.5 - 5.1 mmol/L   Chloride 100 98 - 111 mmol/L   CO2 29 22 - 32 mmol/L   Glucose, Bld 156 (H) 70 - 99 mg/dL    Comment: Glucose reference range applies only to samples taken after fasting for at least 8 hours.   BUN 19 8 - 23 mg/dL   Creatinine, Ser 8.26 (H) 0.61 - 1.24 mg/dL   Calcium 8.8 (L) 8.9 - 10.3 mg/dL   GFR, Estimated 40 (L) >60 mL/min    Comment: (NOTE) Calculated using the CKD-EPI Creatinine Equation (2021)    Anion gap 10 5 - 15    Comment: Performed at [facility], [address], [place], [place] [address]  Glucose, capillary     Status: Abnormal   Collection Time: 11/05/24  9:11 PM  Result Value Ref Range   Glucose-Capillary 115 (H) 70 - 99 mg/dL    Comment: Glucose reference range applies only to samples taken after fasting for at least 8 hours.  APTT     Status: Abnormal   Collection Time: 11/05/24 11:17 PM  Result Value Ref Range   aPTT 76 (H) 24 - 36 seconds    Comment:        IF BASELINE aPTT IS ELEVATED, SUGGEST PATIENT RISK ASSESSMENT BE USED TO DETERMINE APPROPRIATE ANTICOAGULANT THERAPY. Performed at [facility], [address], [place], [place] [address]   Glucose, capillary  Status: Abnormal   Collection Time: 11/06/24  1:13 AM  Result Value Ref Range   Glucose-Capillary 58 (L) 70 - 99 mg/dL    Comment: Glucose reference range applies only to samples taken after fasting for at least 8 hours.  Glucose, capillary     Status: Abnormal   Collection Time: 11/06/24  1:29 AM  Result Value Ref Range   Glucose-Capillary 115 (H) 70 - 99 mg/dL    Comment: Glucose reference range applies only to samples taken after fasting for at least 8 hours.  Glucose, capillary     Status: None   Collection Time: 11/06/24  7:51 AM  Result Value Ref Range   Glucose-Capillary 70 70 - 99 mg/dL     Comment: Glucose reference range applies only to samples taken after fasting for at least 8 hours.  Rapid urine drug screen (hospital performed)     Status: Abnormal   Collection Time: 11/06/24  8:16 AM  Result Value Ref Range   Opiates POSITIVE (A) NONE DETECTED   Cocaine NONE DETECTED NONE DETECTED   Benzodiazepines NONE DETECTED NONE DETECTED   Amphetamines NONE DETECTED NONE DETECTED   Tetrahydrocannabinol NONE DETECTED NONE DETECTED   Barbiturates NONE DETECTED NONE DETECTED    Comment: (NOTE) DRUG SCREEN FOR MEDICAL PURPOSES ONLY.  IF CONFIRMATION IS NEEDED FOR ANY PURPOSE, NOTIFY LAB WITHIN 5 DAYS.  LOWEST DETECTABLE LIMITS FOR URINE DRUG SCREEN Drug Class                     Cutoff (ng/mL) Amphetamine and metabolites    1000 Barbiturate and metabolites    200 Benzodiazepine                 200 Opiates and metabolites        300 Cocaine and metabolites        300 THC                            50 Performed at [facility], [address], [place], [place] [address]   Lipid panel     Status: Abnormal   Collection Time: 11/06/24  9:22 AM  Result Value Ref Range   Cholesterol 92 0 - 200 mg/dL   Triglycerides 75 <849 mg/dL   HDL 35 (L) >59 mg/dL   Total CHOL/HDL Ratio 2.6 RATIO   VLDL 15 0 - 40 mg/dL   LDL Cholesterol 42 0 - 99 mg/dL    Comment:        Total Cholesterol/HDL:CHD Risk Coronary Heart Disease Risk Table                     Men   Women  1/2 Average Risk   3.4   3.3  Average Risk       5.0   4.4  2 X Average Risk   9.6   7.1  3 X Average Risk  23.4   11.0        Use the calculated Patient Ratio above and the CHD Risk Table to determine the patient's CHD Risk.        ATP III CLASSIFICATION (LDL):  <100     mg/dL   Optimal  899-870  mg/dL   Near or Above                    Optimal  130-159  mg/dL   Borderline  160-189  mg/dL   High  >809     mg/dL   Very High Performed at [facility], [address] [place], [place] [address]   CBC      Status: None   Collection Time: 11/06/24  9:22 AM  Result Value Ref Range   WBC 4.8 4.0 - 10.5 K/uL   RBC 4.36 4.22 - 5.81 MIL/uL   Hemoglobin 14.3 13.0 - 17.0 g/dL   HCT 58.4 60.9 - 47.9 %   MCV 95.2 80.0 - 100.0 fL   MCH 32.8 26.0 - 34.0 pg   MCHC 34.5 30.0 - 36.0 g/dL   RDW 87.6 88.4 - 84.4 %   Platelets 171 150 - 400 K/uL   nRBC 0.0 0.0 - 0.2 %    Comment: Performed at [facility], [address], [place], [place] [address]  APTT     Status: Abnormal   Collection Time: 11/06/24  9:22 AM  Result Value Ref Range   aPTT 74 (H) 24 - 36 seconds    Comment:        IF BASELINE aPTT IS ELEVATED, SUGGEST PATIENT RISK ASSESSMENT BE USED TO DETERMINE APPROPRIATE ANTICOAGULANT THERAPY. Performed at [facility], [address] [place], [place] [address]   Heparin level (unfractionated)     Status: Abnormal   Collection Time: 11/06/24  9:22 AM  Result Value Ref Range   Heparin Unfractionated >1.10 (H) 0.30 - 0.70 IU/mL    Comment: (NOTE) The clinical reportable range upper limit is being lowered to >1.10 to align with the FDA approved guidance for the current laboratory assay.  If heparin results are below expected values, and patient dosage has  been confirmed, suggest follow up testing of antithrombin III levels. Performed at [facility], [address], [place], [place] [address]   Basic metabolic panel     Status: Abnormal   Collection Time: 11/06/24  9:22 AM  Result Value Ref Range   Sodium 139 135 - 145 mmol/L   Potassium 3.8 3.5 - 5.1 mmol/L   Chloride 101 98 - 111 mmol/L   CO2 27 22 - 32 mmol/L   Glucose, Bld 121 (H) 70 - 99 mg/dL    Comment: Glucose reference range applies only to samples taken after fasting for at least 8 hours.   BUN 15 8 - 23 mg/dL   Creatinine, Ser 8.53 (H) 0.61 - 1.24 mg/dL   Calcium 8.2 (L) 8.9 - 10.3 mg/dL   GFR, Estimated 49 (L) >60 mL/min    Comment: (NOTE) Calculated using the CKD-EPI Creatinine Equation (2021)     Anion gap 11 5 - 15    Comment: Performed at [facility], [address], [place], [place] [address]  Glucose, capillary     Status: None   Collection Time: 11/06/24 12:12 PM  Result Value Ref Range   Glucose-Capillary 71 70 - 99 mg/dL    Comment: Glucose reference range applies only to samples taken after fasting for at least 8 hours.  Glucose, capillary     Status: None   Collection Time: 11/06/24  6:08 PM  Result Value Ref Range   Glucose-Capillary 80 70 - 99 mg/dL    Comment: Glucose reference range applies only to samples taken after fasting for at least 8 hours.    CARDIAC CATHETERIZATION Result Date: 11/06/2024   Ost LAD to Prox LAD lesion is 99% stenosed. 1.  High-grade edge ISR proximal LAD  stent.  Given previous stent failure and proximity to left main, will obtain cardiothoracic surgical consultation. 2.  LVEDP of 28 mmHg. Recommendation: Restart heparin infusion 2 hours after TR band has been weaned.  Results reviewed with patient, patient's wife, [facility] staff, and hospital medicine.  I reviewed the findings with Dr. Kerrin who will see the patient for consideration of surgical revascularization.   ECHOCARDIOGRAM COMPLETE Result Date: 11/06/2024    ECHOCARDIOGRAM REPORT   Patient Name:   Frederick Miles Date of Exam: 11/06/2024 Medical Rec #:  [id number]        Height:       70.0 in Accession #:    [id number]       Weight:       165.0 lb Date of Birth:  June 03, 1946        BSA:          1.923 m Patient Age:    78 years         BP:           123/71 mmHg Patient Gender: M                HR:           52 bpm. Exam Location:  Inpatient Procedure: 2D Echo (Both Spectral and Color Flow Doppler were utilized during            procedure). Indications:    Elevated Troponin  History:        Patient has prior history of Echocardiogram examinations.                 Signs/Symptoms:Elevated Troponin.  Sonographer:    Norleen Amour Referring Phys: WADDELL A PARCELLS IMPRESSIONS  1. Left  ventricular ejection fraction, by estimation, is 60 to 65%. The left ventricle has normal function. The left ventricle has no regional wall motion abnormalities. Left ventricular diastolic parameters were normal.  2. Right ventricular systolic function is normal. The right ventricular size is normal.  3. The mitral valve is normal in structure. No evidence of mitral valve regurgitation. No evidence of mitral stenosis.  4. The aortic valve is tricuspid. Aortic valve regurgitation is not visualized. Aortic valve sclerosis is present, with no evidence of aortic valve stenosis.  5. The inferior vena cava is normal in size with greater than 50% respiratory variability, suggesting right atrial pressure of 3 mmHg. FINDINGS  Left Ventricle: Left ventricular ejection fraction, by estimation, is 60 to 65%. The left ventricle has normal function. The left ventricle has no regional wall motion abnormalities. The left ventricular internal cavity size was normal in size. There is  no left ventricular hypertrophy. Left ventricular diastolic parameters were normal. Right Ventricle: The right ventricular size is normal. No increase in right ventricular wall thickness. Right ventricular systolic function is normal. Left Atrium: Left atrial size was normal in size. Right Atrium: Right atrial size was normal in size. Pericardium: There is no evidence of pericardial effusion. Mitral Valve: The mitral valve is normal in structure. No evidence of mitral valve regurgitation. No evidence of mitral valve stenosis. MV peak gradient, 4.0 mmHg. The mean mitral valve gradient is 1.0 mmHg. Tricuspid Valve: The tricuspid valve is normal in structure. Tricuspid valve regurgitation is not demonstrated. No evidence of tricuspid stenosis. Aortic Valve: The aortic valve is tricuspid. Aortic valve regurgitation is not visualized. Aortic valve sclerosis is present, with no evidence of aortic valve stenosis. Aortic valve mean gradient measures 3.0 mmHg.  Aortic valve peak  gradient measures 5.3  mmHg. Aortic valve area, by VTI measures 2.97 cm. Pulmonic Valve: The pulmonic valve was normal in structure. Pulmonic valve regurgitation is not visualized. No evidence of pulmonic stenosis. Aorta: The aortic root and ascending aorta are structurally normal, with no evidence of dilitation. Venous: The inferior vena cava is normal in size with greater than 50% respiratory variability, suggesting right atrial pressure of 3 mmHg. IAS/Shunts: The atrial septum is grossly normal.  LEFT VENTRICLE PLAX 2D LVIDd:         4.90 cm     Diastology LVIDs:         2.60 cm     LV e' medial:    8.81 cm/s LV PW:         0.80 cm     LV E/e' medial:  9.5 LV IVS:        1.00 cm     LV e' lateral:   12.60 cm/s LVOT diam:     2.10 cm     LV E/e' lateral: 6.7 LV SV:         89 LV SV Index:   46 LVOT Area:     3.46 cm LV IVRT:       102 msec  LV Volumes (MOD) LV vol d, MOD A2C: 51.4 ml LV vol d, MOD A4C: 58.7 ml LV vol s, MOD A2C: 12.5 ml LV vol s, MOD A4C: 11.5 ml LV SV MOD A2C:     38.9 ml LV SV MOD A4C:     58.7 ml LV SV MOD BP:      42.9 ml RIGHT VENTRICLE             IVC RV Basal diam:  2.70 cm     IVC diam: 1.60 cm RV S prime:     16.00 cm/s TAPSE (M-mode): 1.9 cm      PULMONARY VEINS                             Diastolic Velocity: 29.20 cm/s                             S/D Velocity:       1.70                             Systolic Velocity:  49.70 cm/s LEFT ATRIUM           Index        RIGHT ATRIUM           Index LA diam:      3.40 cm 1.77 cm/m   RA Area:     10.60 cm LA Vol (A2C): 15.9 ml 8.27 ml/m   RA Volume:   21.20 ml  11.02 ml/m LA Vol (A4C): 22.5 ml 11.70 ml/m  AORTIC VALVE                    PULMONIC VALVE AV Area (Vmax):    3.16 cm     PV Vmax:       0.89 m/s AV Area (Vmean):   2.78 cm     PV Peak grad:  3.2 mmHg AV Area (VTI):     2.97 cm AV Vmax:           115.00 cm/s AV Vmean:  86.300 cm/s AV VTI:            0.299 m AV Peak Grad:      5.3 mmHg AV Mean Grad:       3.0 mmHg LVOT Vmax:         105.00 cm/s LVOT Vmean:        69.300 cm/s LVOT VTI:          0.256 m LVOT/AV VTI ratio: 0.86  AORTA Ao Root diam: 3.20 cm Ao Asc diam:  3.60 cm MITRAL VALVE MV Area (PHT): 2.46 cm     SHUNTS MV Area VTI:   2.63 cm     Systemic VTI:  0.26 m MV Peak grad:  4.0 mmHg     Systemic Diam: 2.10 cm MV Mean grad:  1.0 mmHg MV Vmax:       1.00 m/s MV Vmean:      47.5 cm/s MV Decel Time: 309 msec MV E velocity: 83.80 cm/s MV A velocity: 104.00 cm/s MV E/A ratio:  0.81 Sunit Tolia Electronically signed by Madonna Large Signature Date/Time: 11/06/2024/3:57:46 PM    Final    CT Angio Chest Aorta W and/or Wo Contrast Result Date: 11/05/2024 EXAM: CTA CHEST 11/05/2024 11:54:58 AM TECHNIQUE: CTA of the chest was performed after the administration of intravenous contrast. Without and with IV contrast was administered, including 75 mL iohexol  (OMNIPAQUE ) 350 MG/ML injection. Multiplanar reformatted images are provided for review. MIP images are provided for review. Automated exposure control, iterative reconstruction, and/or weight based adjustment of the mA/kV was utilized to reduce the radiation dose to as low as reasonably achievable. COMPARISON: 10/23/2024 CLINICAL HISTORY: Acute aortic syndrome (AAS) suspected FINDINGS: PULMONARY ARTERIES: Pulmonary arteries are adequately opacified for evaluation. No acute pulmonary embolus. Main pulmonary artery is normal in caliber. MEDIASTINUM: The heart and pericardium demonstrate no acute abnormality. There is a 4.4 cm ascending thoracic aortic aneurysm. Aortic atherosclerosis is present. No dissection is noted. LYMPH NODES: No mediastinal, hilar or axillary lymphadenopathy. LUNGS AND PLEURA: The lungs are without acute process. No focal consolidation or pulmonary edema. No evidence of pleural effusion or pneumothorax. UPPER ABDOMEN: Limited images of the upper abdomen are unremarkable. SOFT TISSUES AND BONES: No acute bone or soft tissue abnormality.  IMPRESSION: 1. No evidence of acute aortic syndrome. 2. Stable ascending thoracic aortic aneurysm measuring 4.4 cm. 3. Aortic atherosclerosis. Electronically signed by: Lynwood Seip MD 11/05/2024 01:17 PM EST RP Workstation: [id number]   DG Chest 2 View Result Date: 11/05/2024 EXAM: 2 VIEW(S) XRAY OF THE CHEST 11/05/2024 10:14:00 AM COMPARISON: 06/25/2015 CLINICAL HISTORY: CP FINDINGS: LUNGS AND PLEURA: Hyperinflated lungs. No focal pulmonary opacity. No pulmonary edema. No pleural effusion. No pneumothorax. HEART AND MEDIASTINUM: No acute abnormality of the cardiac and mediastinal silhouettes. BONES AND SOFT TISSUES: Punctate radiopaque foreign bodies over left chest. No acute osseous abnormality. IMPRESSION: 1. No acute cardiopulmonary process. 2. Hyperinflated lungs. Electronically signed by: Norleen Boxer MD 11/05/2024 11:08 AM EST RP Workstation: [id number]    Review of Systems  Constitutional:  Negative for activity change and fatigue.  Respiratory:  Negative for shortness of breath.   Cardiovascular:  Positive for chest pain.  Musculoskeletal:  Positive for arthralgias.   Blood pressure (!) 154/82, pulse (!) 51, temperature 98.4 F (36.9 C), temperature source Oral, resp. rate 12, height 5' 10 (1.778 m), weight 74.8 kg, SpO2 100%. Physical Exam Constitutional:      General: He is not in acute distress.    Appearance: He is well-developed.  HENT:     Head: Normocephalic and atraumatic.  Eyes:     General: No scleral icterus.    Extraocular Movements: Extraocular movements intact.  Cardiovascular:     Rate and Rhythm: Normal rate and regular rhythm.     Heart sounds: Normal heart sounds. No murmur heard. Pulmonary:     Effort: No respiratory distress.     Breath sounds: Normal breath sounds. No wheezing.  Abdominal:     General: There is no distension.     Palpations: Abdomen is soft.  Skin:    General: Skin is warm and dry.  Neurological:     General: No focal deficit present.      Mental Status: He is alert and oriented to person, place, and time.     Cranial Nerves: No cranial nerve deficit.     Motor: No weakness.     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  Troponin elevated, cath today shows severe proximal LAD disease.  CABG indicated for relief of symptoms and myocardial preservation.  PCI is an alternative but several factors complicate expected long term outcome.  I discussed the general nature of the procedure, including the need for general anesthesia, the incisions to be used, the possible use of cardiopulmonary bypass, and the use of temporary pacemaker wires and drainage tubes postoperatively with Mr and Mrs Demmer.  We discussed the expected hospital stay, overall recovery and short and long term outcomes. I informed them of the indications, risks, benefits and alternatives.   He understands the risks include, but are not limited to death, stroke, MI, DVT/PE, bleeding, possible need for transfusion, infections, cardiac arrhythmias, as well as other organ system dysfunction including respiratory, renal, or GI complications.    Plan OPCAB x 1 in AM  Elspeth JAYSON Millers 11/06/2024, 6:13 PM

## 2024-11-06 NOTE — Progress Notes (Signed)
 [facility] PROGRESS NOTE    Progress Note  Frederick Miles  FMW:[id number] DOB: 09-08-1946 DOA: 11/05/2024 PCP: Onita Rush, MD     Brief Narrative:   Frederick Miles is an 78 y.o. male past medical history significant for essential hypertension, hypothyroidism, chronic kidney stage IIIa, atrial fibrillation on Eliquis , coronary artery disease status post stent, bladder cancer emphysema comes in with chest pain while watching TV which improved with nitroglycerin , cardiac biomarkers basically flat,  twelve-lead EKG sinus bradycardia with peaked T waves, normal axis with no T wave abnormalities   Assessment/Plan:   Elevated troponins/atypical chest pain Twelve-lead EKG shows sinus bradycardia with no T wave abnormalities, cardiac markers are slightly elevated.  Chest pain was relieved by nitroglycerin . Cardiology was consulted recommended left heart cardiac cath 11/07/2024. They recommend to continue aspirin and statin heparin and hold apixaban . N.p.o. after midnight. Hold ACE inhibitor. Not on beta-blocker due to bradycardia.  Hyperkalemia: With EKG changes, potassium of 6. Was given Lasix calcium gluconate and Lokelma. ACE inhibitor was held. Repeated labs show potassium of 4.2.  Hyperlipidemia: Continue statins.  Central hypertension: Holding ACE inhibitor no longer on diltiazem  due to bradycardia.  Diabetes mellitus type 2: Continue long-acting insulin  plus sliding scale.  Chronic kidney disease stage IIIa: Baseline creatinine around 1.3-1.5.  Hypothyroidism: Continue Synthroid.  Chronic atrial fibrillation: Eliquis  held, off diltiazem . Seems rate controlled.  PAD: Continue aspirin and statins.    DVT prophylaxis: heparin Family Communication:none Status is: Observation The patient remains OBS appropriate and will d/c before 2 midnights.    Code Status:     Code Status Orders  (From admission, onward)           Start     Ordered    11/05/24 1441  Full code  Continuous       Question:  By:  Answer:  Consent: discussion documented in EHR   11/05/24 1447           Code Status History     This patient has a current code status but no historical code status.         IV Access:   Peripheral IV   Procedures and diagnostic studies:   CT Angio Chest Aorta W and/or Wo Contrast Result Date: 11/05/2024 EXAM: CTA CHEST 11/05/2024 11:54:58 AM TECHNIQUE: CTA of the chest was performed after the administration of intravenous contrast. Without and with IV contrast was administered, including 75 mL iohexol  (OMNIPAQUE ) 350 MG/ML injection. Multiplanar reformatted images are provided for review. MIP images are provided for review. Automated exposure control, iterative reconstruction, and/or weight based adjustment of the mA/kV was utilized to reduce the radiation dose to as low as reasonably achievable. COMPARISON: 10/23/2024 CLINICAL HISTORY: Acute aortic syndrome (AAS) suspected FINDINGS: PULMONARY ARTERIES: Pulmonary arteries are adequately opacified for evaluation. No acute pulmonary embolus. Main pulmonary artery is normal in caliber. MEDIASTINUM: The heart and pericardium demonstrate no acute abnormality. There is a 4.4 cm ascending thoracic aortic aneurysm. Aortic atherosclerosis is present. No dissection is noted. LYMPH NODES: No mediastinal, hilar or axillary lymphadenopathy. LUNGS AND PLEURA: The lungs are without acute process. No focal consolidation or pulmonary edema. No evidence of pleural effusion or pneumothorax. UPPER ABDOMEN: Limited images of the upper abdomen are unremarkable. SOFT TISSUES AND BONES: No acute bone or soft tissue abnormality. IMPRESSION: 1. No evidence of acute aortic syndrome. 2. Stable ascending thoracic aortic aneurysm measuring 4.4 cm. 3. Aortic atherosclerosis. Electronically signed by: Lynwood Seip MD 11/05/2024 01:17 PM EST RP  Workstation: [id number]   DG Chest 2 View Result Date:  11/05/2024 EXAM: 2 VIEW(S) XRAY OF THE CHEST 11/05/2024 10:14:00 AM COMPARISON: 06/25/2015 CLINICAL HISTORY: CP FINDINGS: LUNGS AND PLEURA: Hyperinflated lungs. No focal pulmonary opacity. No pulmonary edema. No pleural effusion. No pneumothorax. HEART AND MEDIASTINUM: No acute abnormality of the cardiac and mediastinal silhouettes. BONES AND SOFT TISSUES: Punctate radiopaque foreign bodies over left chest. No acute osseous abnormality. IMPRESSION: 1. No acute cardiopulmonary process. 2. Hyperinflated lungs. Electronically signed by: Norleen Boxer MD 11/05/2024 11:08 AM EST RP Workstation: [id number]     Medical Consultants:   None.   Subjective:    Frederick Miles currently chest pain-free  Objective:    Vitals:   11/05/24 1800 11/05/24 2113 11/06/24 0103 11/06/24 0515  BP: 127/84 97/65 127/79 123/71  Pulse:  63 (!) 51 (!) 52  Resp:  20 18 20   Temp:  98.2 F (36.8 C) 98 F (36.7 C) 98.2 F (36.8 C)  TempSrc:  Oral Oral Oral  SpO2:  98% 98% 99%  Weight:      Height:       SpO2: 99 %   Intake/Output Summary (Last 24 hours) at 11/06/2024 0602 Last data filed at 11/06/2024 0514 Gross per 24 hour  Intake 1411.56 ml  Output 225 ml  Net 1186.56 ml   Filed Weights   11/05/24 0953  Weight: 74.8 kg    Exam: General exam: In no acute distress. Respiratory system: Good air movement and clear to auscultation. Cardiovascular system: S1 & S2 heard, RRR. No JVD. Gastrointestinal system: Abdomen is nondistended, soft and nontender.  Extremities: No pedal edema. Skin: No rashes, lesions or ulcers Psychiatry: Judgement and insight appear normal. Mood & affect appropriate.    Data Reviewed:    Labs: Basic Metabolic Panel: Recent Labs  Lab 11/05/24 0959 11/05/24 1829  NA 133* 139  K 6.0* 4.2  CL 96* 100  CO2 23 29  GLUCOSE 377* 156*  BUN 18 19  CREATININE 1.52* 1.73*  CALCIUM 8.2* 8.8*   GFR Estimated Creatinine Clearance: 36.9 mL/min (A) (by C-G formula based on  SCr of 1.73 mg/dL (H)). Liver Function Tests: Recent Labs  Lab 11/05/24 0959  AST 27  ALT 26  ALKPHOS 82  BILITOT 2.1*  PROT 6.0*  ALBUMIN 3.9   Recent Labs  Lab 11/05/24 0959  LIPASE 28   No results for input(s): AMMONIA in the last 168 hours. Coagulation profile No results for input(s): INR, PROTIME in the last 168 hours. COVID-19 Labs  No results for input(s): DDIMER, FERRITIN, LDH, CRP in the last 72 hours.  No results found for: SARSCOV2NAA  CBC: Recent Labs  Lab 11/05/24 0959  WBC 7.3  HGB 14.6  HCT 43.1  MCV 97.1  PLT 177   Cardiac Enzymes: No results for input(s): CKTOTAL, CKMB, CKMBINDEX, TROPONINI in the last 168 hours. BNP (last 3 results) No results for input(s): PROBNP in the last 8760 hours. CBG: Recent Labs  Lab 11/05/24 1319 11/05/24 1550 11/05/24 2111 11/06/24 0113 11/06/24 0129  GLUCAP 326* 259* 115* 58* 115*   D-Dimer: No results for input(s): DDIMER in the last 72 hours. Hgb A1c: No results for input(s): HGBA1C in the last 72 hours. Lipid Profile: No results for input(s): CHOL, HDL, LDLCALC, TRIG, CHOLHDL, LDLDIRECT in the last 72 hours. Thyroid function studies: No results for input(s): TSH, T4TOTAL, T3FREE, THYROIDAB in the last 72 hours.  Invalid input(s): FREET3 Anemia work up: No results for input(s): VITAMINB12, FOLATE,  FERRITIN, TIBC, IRON, RETICCTPCT in the last 72 hours. Sepsis Labs: Recent Labs  Lab 11/05/24 0959  WBC 7.3   Microbiology No results found for this or any previous visit (from the past 240 hours).   Medications:    aspirin EC  81 mg Oral Daily   atorvastatin  40 mg Oral Daily   insulin  aspart  0-15 Units Subcutaneous TID WC   insulin  aspart  0-5 Units Subcutaneous QHS   insulin  glargine-yfgn  20 Units Subcutaneous QHS   levothyroxine  100 mcg Oral Daily   pantoprazole  40 mg Oral Daily   Continuous Infusions:  sodium chloride  1  mL/kg/hr (11/06/24 0514)   heparin 1,100 Units/hr (11/06/24 0514)      LOS: 0 days   Frederick Miles  [facility]  11/06/2024, 6:02 AM

## 2024-11-06 NOTE — Inpatient Diabetes Management (Signed)
 Inpatient Diabetes Program Recommendations  AACE/ADA: New Consensus Statement on Inpatient Glycemic Control (2015)  Target Ranges:  Prepandial:   less than 140 mg/dL      Peak postprandial:   less than 180 mg/dL (1-2 hours)      Critically ill patients:  140 - 180 mg/dL   Lab Results  Component Value Date   GLUCAP 71 11/06/2024   HGBA1C 6.4 (H) 01/02/2019    Review of Glycemic Control  Latest Reference Range & Units 11/05/24 21:11 11/06/24 01:13 11/06/24 01:29 11/06/24 07:51 11/06/24 12:12  Glucose-Capillary 70 - 99 mg/dL 884 (H) 58 (L) 884 (H) 70 71  (H): Data is abnormally high (L): Data is abnormally low Diabetes history: Type 2 DM Outpatient Diabetes medications: Lantus 25 units at bedtime, Humalog 6-14 units TID, Metformin 500 mg every day, Jardiance 10 mg QD Current orders for Inpatient glycemic control: Semglee 20 units at bedtime, Novolog  0-15 units TID & HS  Inpatient Diabetes Program Recommendations:    Consider reducing Semglee 16 units at bedtime and Novolog  0-9 units TID & HS.  Thanks, Tinnie Minus, MSN, RNC-OB Diabetes Coordinator [phone] (8a-5p)

## 2024-11-06 NOTE — Progress Notes (Signed)
 PHARMACY - ANTICOAGULATION CONSULT NOTE  Pharmacy Consult for Heparin Indication: atrial fibrillation, ACS  Allergies  Allergen Reactions   Quinolones Other (See Comments)     Unknown    Patient Measurements: Height: 5' 10 (177.8 cm) Weight: 74.8 kg (165 lb) IBW/kg (Calculated) : 73 HEPARIN DW (KG): 74.8  Vital Signs: Temp: 98.8 F (37.1 C) (11/07 0749) Temp Source: Oral (11/07 0749) BP: 132/75 (11/07 0749) Pulse Rate: 52 (11/07 0749)  Labs: Recent Labs    11/05/24 0959 11/05/24 1253 11/05/24 1829 11/05/24 2317 11/06/24 0922  HGB 14.6  --   --   --  14.3  HCT 43.1  --   --   --  41.5  PLT 177  --   --   --  171  APTT  --   --   --  76*  --   HEPARINUNFRC  --   --   --   --  >1.10*  CREATININE 1.52*  --  1.73*  --  1.46*  TROPONINIHS 41* 76*  --   --   --     Estimated Creatinine Clearance: 43.8 mL/min (A) (by C-G formula based on SCr of 1.46 mg/dL (H)).   Medical History: Past Medical History:  Diagnosis Date   AAA (abdominal aortic aneurysm) without rupture 04/28/2017   Aorta US  1/18: 4.2 cm AAA   Bladder cancer (HCC) 2006   Cervicalgia    Coronary atherosclerosis of native coronary artery    stent x1   Diabetes mellitus without complication (HCC)    typ2   Esophageal reflux    Esophageal stricture    Heart murmur    slight   History of cardiovascular stress test    Lexiscan  Myoview  7/16: EF 66%, diaphragmatic attenuation, no ischemia, low risk   History of kidney stones    Hx of adenomatous colonic polyps    Hyperlipidemia    Hypertension    Myocardial infarct [facility])    2007   Unspecified hypothyroidism    Assessment: Patient is a 78 year old male with a history of Afib on eliquis . He presents with chest pain. Pharmacy is consulted for heparin for ACS and Afib.   Heparin level >1.1 due to DOAC, aPTT therapeutic at 74 seconds, CBC ok. Cardiac cath planned later today.  Goal of Therapy:  Heparin level 0.3-0.7 units/ml Monitor platelets by  anticoagulation protocol: Yes   Plan:  Continue heparin 1100 units/h Daily aPTT, heparin level  Ozell Jamaica, PharmD, BCPS, [facility] Clinical Pharmacist [phone] Please check AMION for all [facility] Pharmacy numbers 11/06/2024

## 2024-11-06 NOTE — Progress Notes (Signed)
   Rounding Note    Patient Name: Frederick Miles Date of Encounter: 11/06/2024  [facility] Cardiologist: Ozell Fell, MD   Subjective   No chest pain overnight. Hasn't had AM labs drawn yet. Reviewed plans for cath this afternoon, dependent on renal function  Inpatient Medications    Scheduled Meds:  aspirin EC  81 mg Oral Daily   atorvastatin  40 mg Oral Daily   insulin  aspart  0-15 Units Subcutaneous TID WC   insulin  aspart  0-5 Units Subcutaneous QHS   insulin  glargine-yfgn  20 Units Subcutaneous QHS   levothyroxine  100 mcg Oral Daily   pantoprazole  40 mg Oral Daily   Continuous Infusions:  sodium chloride  1 mL/kg/hr (11/06/24 0700)   heparin 1,100 Units/hr (11/06/24 0700)   PRN Meds: acetaminophen , nitroGLYCERIN , ondansetron  (ZOFRAN ) IV   Vital Signs    Vitals:   11/05/24 2113 11/06/24 0103 11/06/24 0515 11/06/24 0749  BP: 97/65 127/79 123/71 132/75  Pulse: 63 (!) 51 (!) 52 (!) 52  Resp: 20 18 20 19   Temp: 98.2 F (36.8 C) 98 F (36.7 C) 98.2 F (36.8 C) 98.8 F (37.1 C)  TempSrc: Oral Oral Oral Oral  SpO2: 98% 98% 99%   Weight:      Height:        Intake/Output Summary (Last 24 hours) at 11/06/2024 0812 Last data filed at 11/06/2024 0700 Gross per 24 hour  Intake 1958.14 ml  Output 225 ml  Net 1733.14 ml      11/05/2024    9:53 AM 10/26/2024   12:21 PM 10/05/2024    9:48 AM  Last 3 Weights  Weight (lbs) 165 lb 156 lb 6.4 oz 157 lb  Weight (kg) 74.844 kg 70.943 kg 71.215 kg      Telemetry    SR with occasional PVCs - Personally Reviewed  Physical Exam   GEN: No acute distress.   Neck: No JVD Cardiac: RRR, no murmurs, rubs, or gallops.  Respiratory: Clear to auscultation bilaterally. GI: Soft, nontender, non-distended  MS: No edema; No deformity. Neuro:  Nonfocal  Psych: Normal affect   New pertinent results (labs, ECG, imaging, cardiac studies)    Echo pending Cath pending this afternoon  Assessment & Plan     NSTEMI CAD with prior PCI -hsTn 41->76 -last stress test 2016 -see conversation yesterday, extensive discussion about ACS, recommendations for management, cath, potential treatment (medication, PCI, or even CABG) based on results of cath -continue aspirin, statin, heparin. Apixaban  on hold -lipids, lpa pending -echo in process -on diltiazem  at home, held here, and HR still largely bradycardic. Holding on beta blocker for now  Hyperglycemia Hyperkalemia Pseudohyponatremia Acute kidney injury likely 2/2 contrast -electrolyte abnormalities likely driving by elevated glucose. Was given lokelma, LR, and lasix. Received insulin  later in the day -glucose, sodium, potassium now improved -Cr up overnight, not surprising with CT dye yesterday. Placed on IV fluids pre-cath, awaiting AM BMET -holding lisinopril  and hydrochlorothiazide    Paroxysmal atrial fibrillation -apixaban  on hold since AM 11/6, on heparin -currently in sinus rhythm -home diltiazem  on hold, still largely bradycardic, monitoring   Hypertension -hold hydrochlorothiazide  and lisinopril  with AKI   TAA -no dissection on CT     Signed, Shelda Bruckner, MD  11/06/2024, 8:12 AM

## 2024-11-06 NOTE — Plan of Care (Signed)
  Problem: Cardiac: Goal: Ability to achieve and maintain adequate cardiovascular perfusion will improve Outcome: Progressing   Problem: Fluid Volume: Goal: Ability to maintain a balanced intake and output will improve Outcome: Progressing   Problem: Metabolic: Goal: Ability to maintain appropriate glucose levels will improve Outcome: Progressing

## 2024-11-06 NOTE — Progress Notes (Signed)
 PHARMACY - ANTICOAGULATION CONSULT NOTE  Pharmacy Consult for Heparin Indication: atrial fibrillation, ACS  Allergies  Allergen Reactions   Quinolones Other (See Comments)     Unknown    Patient Measurements: Height: 5' 10 (177.8 cm) Weight: 74.8 kg (165 lb) IBW/kg (Calculated) : 73 HEPARIN DW (KG): 74.8  Vital Signs: Temp: 98.3 F (36.8 C) (11/07 1932) Temp Source: Oral (11/07 1932) BP: 143/88 (11/07 1932) Pulse Rate: 51 (11/07 1932)  Labs: Recent Labs    11/05/24 0959 11/05/24 1253 11/05/24 1829 11/05/24 2317 11/06/24 0922 11/06/24 2017  HGB 14.6  --   --   --  14.3  --   HCT 43.1  --   --   --  41.5  --   PLT 177  --   --   --  171  --   APTT  --   --   --  76* 74*  --   LABPROT  --   --   --   --   --  15.4*  INR  --   --   --   --   --  1.2  HEPARINUNFRC  --   --   --   --  >1.10*  --   CREATININE 1.52*  --  1.73*  --  1.46*  --   TROPONINIHS 41* 76*  --   --   --   --     Estimated Creatinine Clearance: 43.8 mL/min (A) (by C-G formula based on SCr of 1.46 mg/dL (H)).   Medical History: Past Medical History:  Diagnosis Date   AAA (abdominal aortic aneurysm) without rupture 04/28/2017   Aorta US  1/18: 4.2 cm AAA   Bladder cancer (HCC) 2006   Cervicalgia    Coronary atherosclerosis of native coronary artery    stent x1   Diabetes mellitus without complication (HCC)    typ2   Esophageal reflux    Esophageal stricture    Heart murmur    slight   History of cardiovascular stress test    Lexiscan  Myoview  7/16: EF 66%, diaphragmatic attenuation, no ischemia, low risk   History of kidney stones    Hx of adenomatous colonic polyps    Hyperlipidemia    Hypertension    Myocardial infarct [facility])    2007   Unspecified hypothyroidism    Assessment: Patient is a 78 year old male with a history of Afib on eliquis . He presents with chest pain. Pharmacy is consulted for heparin for ACS and Afib.   S/p cath and patient to undergo CABG in AM.  Pharmacy  consulted to resume IV heparin 2 hrs post radial band removal.  Radial band removed at 2135; no bleeding nor hematoma per RN.  Goal of Therapy:  Heparin level 0.3-0.7 units/ml aPTT 66-102 seconds Monitor platelets by anticoagulation protocol: Yes   Plan:  At 2330, resume IV heparin at 1100 units/hr Daily aPTT, heparin level and CBC  Riggs Dineen D. Lendell, PharmD, BCPS, BCCCP 11/06/2024, 10:06 PM

## 2024-11-06 NOTE — Consult Note (Signed)
 Reason for Consult:severe LAD disease Referring Physician: Dr. Wendel, Jadavion Spoelstra is an 78 y.o. male.  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  Has been having mild chest discomfort recently usually with activity.  Recently began having symptoms at rest.  On 11/6 had an episode watching the news.  Brought to ED by EMS.  Troponin mildly elevated.  Cath this evening revealed severe, 99 %, stenosis of proximal LAD at edge of stent.  Currently pain free.  Past Medical History:  Diagnosis Date   AAA (abdominal aortic aneurysm) without rupture 04/28/2017   Aorta US  1/18: 4.2 cm AAA   Bladder cancer (HCC) 2006   Cervicalgia    Coronary atherosclerosis of native coronary artery    stent x1   Diabetes mellitus without complication (HCC)    typ2   Esophageal reflux    Esophageal stricture    Heart murmur    slight   History of cardiovascular stress test    Lexiscan  Myoview  7/16: EF 66%, diaphragmatic attenuation, no ischemia, low risk   History of kidney stones    Hx of adenomatous colonic polyps    Hyperlipidemia    Hypertension    Myocardial infarct [facility])    2007   Unspecified hypothyroidism     Past Surgical History:  Procedure Laterality Date   BIOPSY  12/07/2021   Procedure: BIOPSY;  Surgeon: Teressa Toribio SQUIBB, MD;  Location: [facility];  Service: Endoscopy;;   COLONOSCOPY     CORONARY ANGIOPLASTY WITH STENT PLACEMENT  2007   CYSTOSCOPY/RETROGRADE/URETEROSCOPY/STONE EXTRACTION WITH BASKET  08/08/2012   Procedure: CYSTOSCOPY/RETROGRADE/URETEROSCOPY/STONE EXTRACTION WITH BASKET;  Surgeon: Norleen JINNY Seltzer, MD;  Location: [facility];  Service: Urology;  Laterality: Left;  Left Ureteroscopy with Stone Extraction   ESOPHAGOGASTRODUODENOSCOPY (EGD) WITH PROPOFOL  N/A 12/07/2021    Procedure: ESOPHAGOGASTRODUODENOSCOPY (EGD) WITH PROPOFOL ;  Surgeon: Teressa Toribio SQUIBB, MD;  Location: [facility];  Service: Endoscopy;  Laterality: N/A;   EUS N/A 12/07/2021   Procedure: UPPER ENDOSCOPIC ULTRASOUND (EUS) RADIAL;  Surgeon: Teressa Toribio SQUIBB, MD;  Location: [facility];  Service: Endoscopy;  Laterality: N/A;   EUS N/A 12/07/2021   Procedure: UPPER ENDOSCOPIC ULTRASOUND (EUS) LINEAR;  Surgeon: Teressa Toribio SQUIBB, MD;  Location: [facility];  Service: Endoscopy;  Laterality: N/A;   FINE NEEDLE ASPIRATION N/A 12/07/2021   Procedure: FINE NEEDLE ASPIRATION (FNA) LINEAR;  Surgeon: Teressa Toribio SQUIBB, MD;  Location: [facility];  Service: Endoscopy;  Laterality: N/A;   SKIN TAG REMOVAL  07/09/06   right anterior thigh   TONSILLECTOMY AND ADENOIDECTOMY     TRANSURETHRAL RESECTION OF BLADDER  07/09/06   tumor 2-5 cm   URETEROSCOPY WITH HOLMIUM LASER LITHOTRIPSY Left 01/06/2019   Procedure: LEFT URETEROSCOPY left retrograde pylegram basket extraction stone bladder biopsy fulgeration;  Surgeon: Seltzer Norleen, MD;  Location: [facility];  Service: Urology;  Laterality: Left;    Family History  Problem Relation Age of Onset   Heart disease Father    Heart attack Father    Heart disease Mother    Heart attack Paternal Grandfather    Heart attack Paternal Uncle    Colon cancer Neg Hx    Stroke Neg Hx    Diabetes Neg Hx    Stomach cancer Neg Hx    Rectal cancer Neg Hx     Social History:  reports that he quit smoking about 45 years ago. His smoking use included cigarettes. He has never used smokeless tobacco. He reports current alcohol use of about 5.0 standard drinks of alcohol per week. He reports that he does not use drugs.  Allergies:  Allergies  Allergen Reactions   Quinolones Other (See Comments)     Unknown    Medications: Scheduled:  aspirin  81 mg Oral Daily   aspirin EC  81 mg Oral Daily   atorvastatin  40 mg Oral Daily   insulin  aspart  0-15 Units Subcutaneous TID WC    insulin  aspart  0-5 Units Subcutaneous QHS   insulin  glargine-yfgn  20 Units Subcutaneous QHS   levothyroxine  100 mcg Oral Daily   pantoprazole  40 mg Oral Daily   sodium chloride  flush  3 mL Intravenous Q12H    Results for orders placed or performed during the hospital encounter of 11/05/24 (from the past 48 hours)  Basic metabolic panel     Status: Abnormal   Collection Time: 11/05/24  9:59 AM  Result Value Ref Range   Sodium 133 (L) 135 - 145 mmol/L   Potassium 6.0 (H) 3.5 - 5.1 mmol/L   Chloride 96 (L) 98 - 111 mmol/L   CO2 23 22 - 32 mmol/L   Glucose, Bld 377 (H) 70 - 99 mg/dL    Comment: Glucose reference range applies only to samples taken after fasting for at least 8 hours.   BUN 18 8 - 23 mg/dL   Creatinine, Ser 8.47 (H) 0.61 - 1.24 mg/dL   Calcium 8.2 (L) 8.9 - 10.3 mg/dL   GFR, Estimated 47 (L) >60 mL/min    Comment: (NOTE) Calculated using the CKD-EPI Creatinine Equation (2021)    Anion gap 14 5 - 15    Comment: Performed at [facility], [address], [place], [place] [address]  CBC     Status: None   Collection Time: 11/05/24  9:59 AM  Result Value Ref Range   WBC 7.3 4.0 - 10.5 K/uL   RBC 4.44 4.22 - 5.81 MIL/uL   Hemoglobin 14.6 13.0 - 17.0 g/dL   HCT 56.8 60.9 - 47.9 %   MCV 97.1 80.0 - 100.0 fL   MCH 32.9 26.0 - 34.0 pg   MCHC 33.9 30.0 - 36.0 g/dL   RDW 87.6 88.4 - 84.4 %   Platelets 177 150 - 400 K/uL   nRBC 0.0 0.0 - 0.2 %    Comment: Performed at [facility], [address] [place], [place] [address]  Troponin I (High Sensitivity)     Status: Abnormal   Collection Time: 11/05/24  9:59 AM  Result Value Ref Range   Troponin I (High Sensitivity) 41 (H) <18 ng/L    Comment: (NOTE) Elevated high sensitivity troponin I (hsTnI) values and significant  changes across serial measurements may suggest ACS but many other  chronic and acute conditions are known to elevate hsTnI results.  Refer to the Links section for chest pain algorithms  and additional  guidance. Performed at [facility], [address], [place], [place] [address]   Hepatic function panel     Status: Abnormal   Collection Time: 11/05/24  9:59 AM  Result Value Ref Range   Total Protein 6.0 (L) 6.5 - 8.1 g/dL   Albumin 3.9 3.5 - 5.0 g/dL   AST 27 15 - 41 U/L   ALT 26 0 - 44 U/L  Alkaline Phosphatase 82 38 - 126 U/L   Total Bilirubin 2.1 (H) 0.0 - 1.2 mg/dL   Bilirubin, Direct 0.4 (H) 0.0 - 0.2 mg/dL   Indirect Bilirubin 1.7 (H) 0.3 - 0.9 mg/dL    Comment: Performed at [facility], [address], [place], [place] [address]  Lipase, blood     Status: None   Collection Time: 11/05/24  9:59 AM  Result Value Ref Range   Lipase 28 11 - 51 U/L    Comment: Performed at [facility], [address] [place], [place] [address]  Troponin I (High Sensitivity)     Status: Abnormal   Collection Time: 11/05/24 12:53 PM  Result Value Ref Range   Troponin I (High Sensitivity) 76 (H) <18 ng/L    Comment: RESULT CALLED TO, READ BACK BY AND VERIFIED WITH MADELINE HARDY,RN AT 1358 11/05/2024 BY ZBEECH. (NOTE) Elevated high sensitivity troponin I (hsTnI) values and significant  changes across serial measurements may suggest ACS but many other  chronic and acute conditions are known to elevate hsTnI results.  Refer to the Links section for chest pain algorithms and additional  guidance. Performed at [facility], [address], [place], [place] [address]   CBG monitoring, ED     Status: Abnormal   Collection Time: 11/05/24  1:19 PM  Result Value Ref Range   Glucose-Capillary 326 (H) 70 - 99 mg/dL    Comment: Glucose reference range applies only to samples taken after fasting for at least 8 hours.  CBG monitoring, ED     Status: Abnormal   Collection Time: 11/05/24  3:50 PM  Result Value Ref Range   Glucose-Capillary 259 (H) 70 - 99 mg/dL    Comment: Glucose reference range applies only to samples taken after fasting for at least 8  hours.  Basic metabolic panel     Status: Abnormal   Collection Time: 11/05/24  6:29 PM  Result Value Ref Range   Sodium 139 135 - 145 mmol/L   Potassium 4.2 3.5 - 5.1 mmol/L   Chloride 100 98 - 111 mmol/L   CO2 29 22 - 32 mmol/L   Glucose, Bld 156 (H) 70 - 99 mg/dL    Comment: Glucose reference range applies only to samples taken after fasting for at least 8 hours.   BUN 19 8 - 23 mg/dL   Creatinine, Ser 8.26 (H) 0.61 - 1.24 mg/dL   Calcium 8.8 (L) 8.9 - 10.3 mg/dL   GFR, Estimated 40 (L) >60 mL/min    Comment: (NOTE) Calculated using the CKD-EPI Creatinine Equation (2021)    Anion gap 10 5 - 15    Comment: Performed at [facility], [address], [place], [place] [address]  Glucose, capillary     Status: Abnormal   Collection Time: 11/05/24  9:11 PM  Result Value Ref Range   Glucose-Capillary 115 (H) 70 - 99 mg/dL    Comment: Glucose reference range applies only to samples taken after fasting for at least 8 hours.  APTT     Status: Abnormal   Collection Time: 11/05/24 11:17 PM  Result Value Ref Range   aPTT 76 (H) 24 - 36 seconds    Comment:        IF BASELINE aPTT IS ELEVATED, SUGGEST PATIENT RISK ASSESSMENT BE USED TO DETERMINE APPROPRIATE ANTICOAGULANT THERAPY. Performed at [facility], [address], [place], [place] [address]   Glucose, capillary  Status: Abnormal   Collection Time: 11/06/24  1:13 AM  Result Value Ref Range   Glucose-Capillary 58 (L) 70 - 99 mg/dL    Comment: Glucose reference range applies only to samples taken after fasting for at least 8 hours.  Glucose, capillary     Status: Abnormal   Collection Time: 11/06/24  1:29 AM  Result Value Ref Range   Glucose-Capillary 115 (H) 70 - 99 mg/dL    Comment: Glucose reference range applies only to samples taken after fasting for at least 8 hours.  Glucose, capillary     Status: None   Collection Time: 11/06/24  7:51 AM  Result Value Ref Range   Glucose-Capillary 70 70 - 99 mg/dL     Comment: Glucose reference range applies only to samples taken after fasting for at least 8 hours.  Rapid urine drug screen (hospital performed)     Status: Abnormal   Collection Time: 11/06/24  8:16 AM  Result Value Ref Range   Opiates POSITIVE (A) NONE DETECTED   Cocaine NONE DETECTED NONE DETECTED   Benzodiazepines NONE DETECTED NONE DETECTED   Amphetamines NONE DETECTED NONE DETECTED   Tetrahydrocannabinol NONE DETECTED NONE DETECTED   Barbiturates NONE DETECTED NONE DETECTED    Comment: (NOTE) DRUG SCREEN FOR MEDICAL PURPOSES ONLY.  IF CONFIRMATION IS NEEDED FOR ANY PURPOSE, NOTIFY LAB WITHIN 5 DAYS.  LOWEST DETECTABLE LIMITS FOR URINE DRUG SCREEN Drug Class                     Cutoff (ng/mL) Amphetamine and metabolites    1000 Barbiturate and metabolites    200 Benzodiazepine                 200 Opiates and metabolites        300 Cocaine and metabolites        300 THC                            50 Performed at [facility], [address], [place], [place] [address]   Lipid panel     Status: Abnormal   Collection Time: 11/06/24  9:22 AM  Result Value Ref Range   Cholesterol 92 0 - 200 mg/dL   Triglycerides 75 <849 mg/dL   HDL 35 (L) >59 mg/dL   Total CHOL/HDL Ratio 2.6 RATIO   VLDL 15 0 - 40 mg/dL   LDL Cholesterol 42 0 - 99 mg/dL    Comment:        Total Cholesterol/HDL:CHD Risk Coronary Heart Disease Risk Table                     Men   Women  1/2 Average Risk   3.4   3.3  Average Risk       5.0   4.4  2 X Average Risk   9.6   7.1  3 X Average Risk  23.4   11.0        Use the calculated Patient Ratio above and the CHD Risk Table to determine the patient's CHD Risk.        ATP III CLASSIFICATION (LDL):  <100     mg/dL   Optimal  899-870  mg/dL   Near or Above                    Optimal  130-159  mg/dL   Borderline  160-189  mg/dL   High  >809     mg/dL   Very High Performed at [facility], [address] [place], [place] [address]   CBC      Status: None   Collection Time: 11/06/24  9:22 AM  Result Value Ref Range   WBC 4.8 4.0 - 10.5 K/uL   RBC 4.36 4.22 - 5.81 MIL/uL   Hemoglobin 14.3 13.0 - 17.0 g/dL   HCT 58.4 60.9 - 47.9 %   MCV 95.2 80.0 - 100.0 fL   MCH 32.8 26.0 - 34.0 pg   MCHC 34.5 30.0 - 36.0 g/dL   RDW 87.6 88.4 - 84.4 %   Platelets 171 150 - 400 K/uL   nRBC 0.0 0.0 - 0.2 %    Comment: Performed at [facility], [address], [place], [place] [address]  APTT     Status: Abnormal   Collection Time: 11/06/24  9:22 AM  Result Value Ref Range   aPTT 74 (H) 24 - 36 seconds    Comment:        IF BASELINE aPTT IS ELEVATED, SUGGEST PATIENT RISK ASSESSMENT BE USED TO DETERMINE APPROPRIATE ANTICOAGULANT THERAPY. Performed at [facility], [address] [place], [place] [address]   Heparin level (unfractionated)     Status: Abnormal   Collection Time: 11/06/24  9:22 AM  Result Value Ref Range   Heparin Unfractionated >1.10 (H) 0.30 - 0.70 IU/mL    Comment: (NOTE) The clinical reportable range upper limit is being lowered to >1.10 to align with the FDA approved guidance for the current laboratory assay.  If heparin results are below expected values, and patient dosage has  been confirmed, suggest follow up testing of antithrombin III levels. Performed at [facility], [address], [place], [place] [address]   Basic metabolic panel     Status: Abnormal   Collection Time: 11/06/24  9:22 AM  Result Value Ref Range   Sodium 139 135 - 145 mmol/L   Potassium 3.8 3.5 - 5.1 mmol/L   Chloride 101 98 - 111 mmol/L   CO2 27 22 - 32 mmol/L   Glucose, Bld 121 (H) 70 - 99 mg/dL    Comment: Glucose reference range applies only to samples taken after fasting for at least 8 hours.   BUN 15 8 - 23 mg/dL   Creatinine, Ser 8.53 (H) 0.61 - 1.24 mg/dL   Calcium 8.2 (L) 8.9 - 10.3 mg/dL   GFR, Estimated 49 (L) >60 mL/min    Comment: (NOTE) Calculated using the CKD-EPI Creatinine Equation (2021)     Anion gap 11 5 - 15    Comment: Performed at [facility], [address], [place], [place] [address]  Glucose, capillary     Status: None   Collection Time: 11/06/24 12:12 PM  Result Value Ref Range   Glucose-Capillary 71 70 - 99 mg/dL    Comment: Glucose reference range applies only to samples taken after fasting for at least 8 hours.  Glucose, capillary     Status: None   Collection Time: 11/06/24  6:08 PM  Result Value Ref Range   Glucose-Capillary 80 70 - 99 mg/dL    Comment: Glucose reference range applies only to samples taken after fasting for at least 8 hours.    CARDIAC CATHETERIZATION Result Date: 11/06/2024   Ost LAD to Prox LAD lesion is 99% stenosed. 1.  High-grade edge ISR proximal LAD  stent.  Given previous stent failure and proximity to left main, will obtain cardiothoracic surgical consultation. 2.  LVEDP of 28 mmHg. Recommendation: Restart heparin infusion 2 hours after TR band has been weaned.  Results reviewed with patient, patient's wife, [facility] staff, and hospital medicine.  I reviewed the findings with Dr. Kerrin who will see the patient for consideration of surgical revascularization.   ECHOCARDIOGRAM COMPLETE Result Date: 11/06/2024    ECHOCARDIOGRAM REPORT   Patient Name:   Frederick Miles Date of Exam: 11/06/2024 Medical Rec #:  [id number]        Height:       70.0 in Accession #:    [id number]       Weight:       165.0 lb Date of Birth:  June 03, 1946        BSA:          1.923 m Patient Age:    78 years         BP:           123/71 mmHg Patient Gender: M                HR:           52 bpm. Exam Location:  Inpatient Procedure: 2D Echo (Both Spectral and Color Flow Doppler were utilized during            procedure). Indications:    Elevated Troponin  History:        Patient has prior history of Echocardiogram examinations.                 Signs/Symptoms:Elevated Troponin.  Sonographer:    Norleen Amour Referring Phys: WADDELL A PARCELLS IMPRESSIONS  1. Left  ventricular ejection fraction, by estimation, is 60 to 65%. The left ventricle has normal function. The left ventricle has no regional wall motion abnormalities. Left ventricular diastolic parameters were normal.  2. Right ventricular systolic function is normal. The right ventricular size is normal.  3. The mitral valve is normal in structure. No evidence of mitral valve regurgitation. No evidence of mitral stenosis.  4. The aortic valve is tricuspid. Aortic valve regurgitation is not visualized. Aortic valve sclerosis is present, with no evidence of aortic valve stenosis.  5. The inferior vena cava is normal in size with greater than 50% respiratory variability, suggesting right atrial pressure of 3 mmHg. FINDINGS  Left Ventricle: Left ventricular ejection fraction, by estimation, is 60 to 65%. The left ventricle has normal function. The left ventricle has no regional wall motion abnormalities. The left ventricular internal cavity size was normal in size. There is  no left ventricular hypertrophy. Left ventricular diastolic parameters were normal. Right Ventricle: The right ventricular size is normal. No increase in right ventricular wall thickness. Right ventricular systolic function is normal. Left Atrium: Left atrial size was normal in size. Right Atrium: Right atrial size was normal in size. Pericardium: There is no evidence of pericardial effusion. Mitral Valve: The mitral valve is normal in structure. No evidence of mitral valve regurgitation. No evidence of mitral valve stenosis. MV peak gradient, 4.0 mmHg. The mean mitral valve gradient is 1.0 mmHg. Tricuspid Valve: The tricuspid valve is normal in structure. Tricuspid valve regurgitation is not demonstrated. No evidence of tricuspid stenosis. Aortic Valve: The aortic valve is tricuspid. Aortic valve regurgitation is not visualized. Aortic valve sclerosis is present, with no evidence of aortic valve stenosis. Aortic valve mean gradient measures 3.0 mmHg.  Aortic valve peak  gradient measures 5.3  mmHg. Aortic valve area, by VTI measures 2.97 cm. Pulmonic Valve: The pulmonic valve was normal in structure. Pulmonic valve regurgitation is not visualized. No evidence of pulmonic stenosis. Aorta: The aortic root and ascending aorta are structurally normal, with no evidence of dilitation. Venous: The inferior vena cava is normal in size with greater than 50% respiratory variability, suggesting right atrial pressure of 3 mmHg. IAS/Shunts: The atrial septum is grossly normal.  LEFT VENTRICLE PLAX 2D LVIDd:         4.90 cm     Diastology LVIDs:         2.60 cm     LV e' medial:    8.81 cm/s LV PW:         0.80 cm     LV E/e' medial:  9.5 LV IVS:        1.00 cm     LV e' lateral:   12.60 cm/s LVOT diam:     2.10 cm     LV E/e' lateral: 6.7 LV SV:         89 LV SV Index:   46 LVOT Area:     3.46 cm LV IVRT:       102 msec  LV Volumes (MOD) LV vol d, MOD A2C: 51.4 ml LV vol d, MOD A4C: 58.7 ml LV vol s, MOD A2C: 12.5 ml LV vol s, MOD A4C: 11.5 ml LV SV MOD A2C:     38.9 ml LV SV MOD A4C:     58.7 ml LV SV MOD BP:      42.9 ml RIGHT VENTRICLE             IVC RV Basal diam:  2.70 cm     IVC diam: 1.60 cm RV S prime:     16.00 cm/s TAPSE (M-mode): 1.9 cm      PULMONARY VEINS                             Diastolic Velocity: 29.20 cm/s                             S/D Velocity:       1.70                             Systolic Velocity:  49.70 cm/s LEFT ATRIUM           Index        RIGHT ATRIUM           Index LA diam:      3.40 cm 1.77 cm/m   RA Area:     10.60 cm LA Vol (A2C): 15.9 ml 8.27 ml/m   RA Volume:   21.20 ml  11.02 ml/m LA Vol (A4C): 22.5 ml 11.70 ml/m  AORTIC VALVE                    PULMONIC VALVE AV Area (Vmax):    3.16 cm     PV Vmax:       0.89 m/s AV Area (Vmean):   2.78 cm     PV Peak grad:  3.2 mmHg AV Area (VTI):     2.97 cm AV Vmax:           115.00 cm/s AV Vmean:  86.300 cm/s AV VTI:            0.299 m AV Peak Grad:      5.3 mmHg AV Mean Grad:       3.0 mmHg LVOT Vmax:         105.00 cm/s LVOT Vmean:        69.300 cm/s LVOT VTI:          0.256 m LVOT/AV VTI ratio: 0.86  AORTA Ao Root diam: 3.20 cm Ao Asc diam:  3.60 cm MITRAL VALVE MV Area (PHT): 2.46 cm     SHUNTS MV Area VTI:   2.63 cm     Systemic VTI:  0.26 m MV Peak grad:  4.0 mmHg     Systemic Diam: 2.10 cm MV Mean grad:  1.0 mmHg MV Vmax:       1.00 m/s MV Vmean:      47.5 cm/s MV Decel Time: 309 msec MV E velocity: 83.80 cm/s MV A velocity: 104.00 cm/s MV E/A ratio:  0.81 Sunit Tolia Electronically signed by Madonna Large Signature Date/Time: 11/06/2024/3:57:46 PM    Final    CT Angio Chest Aorta W and/or Wo Contrast Result Date: 11/05/2024 EXAM: CTA CHEST 11/05/2024 11:54:58 AM TECHNIQUE: CTA of the chest was performed after the administration of intravenous contrast. Without and with IV contrast was administered, including 75 mL iohexol  (OMNIPAQUE ) 350 MG/ML injection. Multiplanar reformatted images are provided for review. MIP images are provided for review. Automated exposure control, iterative reconstruction, and/or weight based adjustment of the mA/kV was utilized to reduce the radiation dose to as low as reasonably achievable. COMPARISON: 10/23/2024 CLINICAL HISTORY: Acute aortic syndrome (AAS) suspected FINDINGS: PULMONARY ARTERIES: Pulmonary arteries are adequately opacified for evaluation. No acute pulmonary embolus. Main pulmonary artery is normal in caliber. MEDIASTINUM: The heart and pericardium demonstrate no acute abnormality. There is a 4.4 cm ascending thoracic aortic aneurysm. Aortic atherosclerosis is present. No dissection is noted. LYMPH NODES: No mediastinal, hilar or axillary lymphadenopathy. LUNGS AND PLEURA: The lungs are without acute process. No focal consolidation or pulmonary edema. No evidence of pleural effusion or pneumothorax. UPPER ABDOMEN: Limited images of the upper abdomen are unremarkable. SOFT TISSUES AND BONES: No acute bone or soft tissue abnormality.  IMPRESSION: 1. No evidence of acute aortic syndrome. 2. Stable ascending thoracic aortic aneurysm measuring 4.4 cm. 3. Aortic atherosclerosis. Electronically signed by: Lynwood Seip MD 11/05/2024 01:17 PM EST RP Workstation: [id number]   DG Chest 2 View Result Date: 11/05/2024 EXAM: 2 VIEW(S) XRAY OF THE CHEST 11/05/2024 10:14:00 AM COMPARISON: 06/25/2015 CLINICAL HISTORY: CP FINDINGS: LUNGS AND PLEURA: Hyperinflated lungs. No focal pulmonary opacity. No pulmonary edema. No pleural effusion. No pneumothorax. HEART AND MEDIASTINUM: No acute abnormality of the cardiac and mediastinal silhouettes. BONES AND SOFT TISSUES: Punctate radiopaque foreign bodies over left chest. No acute osseous abnormality. IMPRESSION: 1. No acute cardiopulmonary process. 2. Hyperinflated lungs. Electronically signed by: Norleen Boxer MD 11/05/2024 11:08 AM EST RP Workstation: [id number]    Review of Systems  Constitutional:  Negative for activity change and fatigue.  Respiratory:  Negative for shortness of breath.   Cardiovascular:  Positive for chest pain.  Musculoskeletal:  Positive for arthralgias.   Blood pressure (!) 154/82, pulse (!) 51, temperature 98.4 F (36.9 C), temperature source Oral, resp. rate 12, height 5' 10 (1.778 m), weight 74.8 kg, SpO2 100%. Physical Exam Constitutional:      General: He is not in acute distress.    Appearance: He is well-developed.  HENT:     Head: Normocephalic and atraumatic.  Eyes:     General: No scleral icterus.    Extraocular Movements: Extraocular movements intact.  Cardiovascular:     Rate and Rhythm: Normal rate and regular rhythm.     Heart sounds: Normal heart sounds. No murmur heard. Pulmonary:     Effort: No respiratory distress.     Breath sounds: Normal breath sounds. No wheezing.  Abdominal:     General: There is no distension.     Palpations: Abdomen is soft.  Skin:    General: Skin is warm and dry.  Neurological:     General: No focal deficit present.      Mental Status: He is alert and oriented to person, place, and time.     Cranial Nerves: No cranial nerve deficit.     Motor: No weakness.     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  Troponin elevated, cath today shows severe proximal LAD disease.  CABG indicated for relief of symptoms and myocardial preservation.  PCI is an alternative but several factors complicate expected long term outcome.  I discussed the general nature of the procedure, including the need for general anesthesia, the incisions to be used, the possible use of cardiopulmonary bypass, and the use of temporary pacemaker wires and drainage tubes postoperatively with Mr and Mrs Demmer.  We discussed the expected hospital stay, overall recovery and short and long term outcomes. I informed them of the indications, risks, benefits and alternatives.   He understands the risks include, but are not limited to death, stroke, MI, DVT/PE, bleeding, possible need for transfusion, infections, cardiac arrhythmias, as well as other organ system dysfunction including respiratory, renal, or GI complications.    Plan OPCAB x 1 in AM  Elspeth JAYSON Millers 11/06/2024, 6:13 PM

## 2024-11-06 NOTE — Interval H&P Note (Signed)
 History and Physical Interval Note:  11/06/2024 2:21 PM  Frederick Miles  has presented today for surgery, with the diagnosis of nstemi.  The various methods of treatment have been discussed with the patient and family. After consideration of risks, benefits and other options for treatment, the patient has consented to  Procedure(s): LEFT HEART CATH AND CORONARY ANGIOGRAPHY (N/A) as a surgical intervention.  The patient's history has been reviewed, patient examined, no change in status, stable for surgery.  I have reviewed the patient's chart and labs.  Questions were answered to the patient's satisfaction.     Nalia Honeycutt K Jong Rickman

## 2024-11-06 NOTE — Progress Notes (Signed)
  Echocardiogram 2D Echocardiogram has been performed.  Norleen ORN [facility] 11/06/2024, 8:34 AM

## 2024-11-06 NOTE — H&P (View-Only) (Signed)
   Rounding Note    Patient Name: Frederick Miles Date of Encounter: 11/06/2024  [facility] Cardiologist: Ozell Fell, MD   Subjective   No chest pain overnight. Hasn't had AM labs drawn yet. Reviewed plans for cath this afternoon, dependent on renal function  Inpatient Medications    Scheduled Meds:  aspirin EC  81 mg Oral Daily   atorvastatin  40 mg Oral Daily   insulin  aspart  0-15 Units Subcutaneous TID WC   insulin  aspart  0-5 Units Subcutaneous QHS   insulin  glargine-yfgn  20 Units Subcutaneous QHS   levothyroxine  100 mcg Oral Daily   pantoprazole  40 mg Oral Daily   Continuous Infusions:  sodium chloride  1 mL/kg/hr (11/06/24 0700)   heparin 1,100 Units/hr (11/06/24 0700)   PRN Meds: acetaminophen , nitroGLYCERIN , ondansetron  (ZOFRAN ) IV   Vital Signs    Vitals:   11/05/24 2113 11/06/24 0103 11/06/24 0515 11/06/24 0749  BP: 97/65 127/79 123/71 132/75  Pulse: 63 (!) 51 (!) 52 (!) 52  Resp: 20 18 20 19   Temp: 98.2 F (36.8 C) 98 F (36.7 C) 98.2 F (36.8 C) 98.8 F (37.1 C)  TempSrc: Oral Oral Oral Oral  SpO2: 98% 98% 99%   Weight:      Height:        Intake/Output Summary (Last 24 hours) at 11/06/2024 0812 Last data filed at 11/06/2024 0700 Gross per 24 hour  Intake 1958.14 ml  Output 225 ml  Net 1733.14 ml      11/05/2024    9:53 AM 10/26/2024   12:21 PM 10/05/2024    9:48 AM  Last 3 Weights  Weight (lbs) 165 lb 156 lb 6.4 oz 157 lb  Weight (kg) 74.844 kg 70.943 kg 71.215 kg      Telemetry    SR with occasional PVCs - Personally Reviewed  Physical Exam   GEN: No acute distress.   Neck: No JVD Cardiac: RRR, no murmurs, rubs, or gallops.  Respiratory: Clear to auscultation bilaterally. GI: Soft, nontender, non-distended  MS: No edema; No deformity. Neuro:  Nonfocal  Psych: Normal affect   New pertinent results (labs, ECG, imaging, cardiac studies)    Echo pending Cath pending this afternoon  Assessment & Plan     NSTEMI CAD with prior PCI -hsTn 41->76 -last stress test 2016 -see conversation yesterday, extensive discussion about ACS, recommendations for management, cath, potential treatment (medication, PCI, or even CABG) based on results of cath -continue aspirin, statin, heparin. Apixaban  on hold -lipids, lpa pending -echo in process -on diltiazem  at home, held here, and HR still largely bradycardic. Holding on beta blocker for now  Hyperglycemia Hyperkalemia Pseudohyponatremia Acute kidney injury likely 2/2 contrast -electrolyte abnormalities likely driving by elevated glucose. Was given lokelma, LR, and lasix. Received insulin  later in the day -glucose, sodium, potassium now improved -Cr up overnight, not surprising with CT dye yesterday. Placed on IV fluids pre-cath, awaiting AM BMET -holding lisinopril  and hydrochlorothiazide    Paroxysmal atrial fibrillation -apixaban  on hold since AM 11/6, on heparin -currently in sinus rhythm -home diltiazem  on hold, still largely bradycardic, monitoring   Hypertension -hold hydrochlorothiazide  and lisinopril  with AKI   TAA -no dissection on CT     Signed, Shelda Bruckner, MD  11/06/2024, 8:12 AM

## 2024-11-07 ENCOUNTER — Ambulatory Visit (HOSPITAL_COMMUNITY)
Admission: RE | Admit: 2024-11-07 | Discharge: 2024-11-07 | Disposition: A | Discharge status: Home / Self Care | Source: Ambulatory Visit | Attending: Anesthesiology | Admitting: Anesthesiology

## 2024-11-07 ENCOUNTER — Inpatient Hospital Stay (HOSPITAL_COMMUNITY)

## 2024-11-07 ENCOUNTER — Inpatient Hospital Stay (HOSPITAL_COMMUNITY): Admission: EM | Disposition: A | Payer: Self-pay | Source: Home / Self Care | Attending: Internal Medicine

## 2024-11-07 ENCOUNTER — Encounter (HOSPITAL_COMMUNITY): Payer: Self-pay | Admitting: Internal Medicine

## 2024-11-07 ENCOUNTER — Inpatient Hospital Stay (HOSPITAL_COMMUNITY): Admitting: Anesthesiology

## 2024-11-07 DIAGNOSIS — I25110 Atherosclerotic heart disease of native coronary artery with unstable angina pectoris: Secondary | ICD-10-CM | POA: Insufficient documentation

## 2024-11-07 DIAGNOSIS — J90 Pleural effusion, not elsewhere classified: Secondary | ICD-10-CM | POA: Insufficient documentation

## 2024-11-07 DIAGNOSIS — Z789 Other specified health status: Secondary | ICD-10-CM | POA: Diagnosis not present

## 2024-11-07 DIAGNOSIS — Z955 Presence of coronary angioplasty implant and graft: Secondary | ICD-10-CM | POA: Diagnosis not present

## 2024-11-07 DIAGNOSIS — J449 Chronic obstructive pulmonary disease, unspecified: Secondary | ICD-10-CM | POA: Diagnosis not present

## 2024-11-07 DIAGNOSIS — I25118 Atherosclerotic heart disease of native coronary artery with other forms of angina pectoris: Secondary | ICD-10-CM | POA: Diagnosis not present

## 2024-11-07 DIAGNOSIS — I10 Essential (primary) hypertension: Secondary | ICD-10-CM | POA: Diagnosis not present

## 2024-11-07 DIAGNOSIS — I77810 Thoracic aortic ectasia: Secondary | ICD-10-CM | POA: Insufficient documentation

## 2024-11-07 DIAGNOSIS — I083 Combined rheumatic disorders of mitral, aortic and tricuspid valves: Secondary | ICD-10-CM | POA: Insufficient documentation

## 2024-11-07 DIAGNOSIS — I2510 Atherosclerotic heart disease of native coronary artery without angina pectoris: Secondary | ICD-10-CM | POA: Diagnosis not present

## 2024-11-07 HISTORY — PX: INTRAOPERATIVE TRANSESOPHAGEAL ECHOCARDIOGRAM: SHX5062

## 2024-11-07 HISTORY — PX: CORONARY ARTERY BYPASS GRAFT: SHX141

## 2024-11-07 LAB — CBC
HCT: 32.7 % — ABNORMAL LOW (ref 39.0–52.0)
HCT: 29.4 % — ABNORMAL LOW (ref 39.0–52.0)
Hemoglobin: 11.1 g/dL — ABNORMAL LOW (ref 13.0–17.0)
Hemoglobin: 10.1 g/dL — ABNORMAL LOW (ref 13.0–17.0)
MCH: 32.8 pg (ref 26.0–34.0)
MCH: 33 pg (ref 26.0–34.0)
MCHC: 33.9 g/dL (ref 30.0–36.0)
MCHC: 34.4 g/dL (ref 30.0–36.0)
MCV: 96.7 fL (ref 80.0–100.0)
MCV: 96.1 fL (ref 80.0–100.0)
Platelets: 128 K/uL — ABNORMAL LOW (ref 150–400)
Platelets: 142 K/uL — ABNORMAL LOW (ref 150–400)
RBC: 3.38 MIL/uL — ABNORMAL LOW (ref 4.22–5.81)
RBC: 3.06 MIL/uL — ABNORMAL LOW (ref 4.22–5.81)
RDW: 12.3 % (ref 11.5–15.5)
RDW: 12.4 % (ref 11.5–15.5)
WBC: 12.8 K/uL — ABNORMAL HIGH (ref 4.0–10.5)
WBC: 14.6 K/uL — ABNORMAL HIGH (ref 4.0–10.5)
nRBC: 0 % (ref 0.0–0.2)
nRBC: 0 % (ref 0.0–0.2)

## 2024-11-07 LAB — BASIC METABOLIC PANEL WITH GFR
Anion gap: 13 (ref 5–15)
Anion gap: 18 — ABNORMAL HIGH (ref 5–15)
BUN: 15 mg/dL (ref 8–23)
BUN: 14 mg/dL (ref 8–23)
CO2: 20 mmol/L — ABNORMAL LOW (ref 22–32)
CO2: 18 mmol/L — ABNORMAL LOW (ref 22–32)
Calcium: 8.4 mg/dL — ABNORMAL LOW (ref 8.9–10.3)
Calcium: 7.7 mg/dL — ABNORMAL LOW (ref 8.9–10.3)
Chloride: 105 mmol/L (ref 98–111)
Chloride: 104 mmol/L (ref 98–111)
Creatinine, Ser: 1.26 mg/dL — ABNORMAL HIGH (ref 0.61–1.24)
Creatinine, Ser: 1.27 mg/dL — ABNORMAL HIGH (ref 0.61–1.24)
GFR, Estimated: 59 mL/min — ABNORMAL LOW (ref >60)
GFR, Estimated: 58 mL/min — ABNORMAL LOW (ref >60)
Glucose, Bld: 86 mg/dL (ref 70–99)
Glucose, Bld: 106 mg/dL — ABNORMAL HIGH (ref 70–99)
Potassium: 4.5 mmol/L (ref 3.5–5.1)
Potassium: 3.9 mmol/L (ref 3.5–5.1)
Sodium: 138 mmol/L (ref 135–145)
Sodium: 140 mmol/L (ref 135–145)

## 2024-11-07 LAB — POCT I-STAT EG7
Acid-base deficit: 3 mmol/L — ABNORMAL HIGH (ref 0.0–2.0)
Bicarbonate: 22.5 mmol/L (ref 20.0–28.0)
Calcium, Ion: 1.05 mmol/L — ABNORMAL LOW (ref 1.15–1.40)
HCT: 24 % — ABNORMAL LOW (ref 39.0–52.0)
Hemoglobin: 8.2 g/dL — ABNORMAL LOW (ref 13.0–17.0)
O2 Saturation: 69 %
Potassium: 5.1 mmol/L (ref 3.5–5.1)
Sodium: 140 mmol/L (ref 135–145)
TCO2: 24 mmol/L (ref 22–32)
pCO2, Ven: 40.2 mmHg — ABNORMAL LOW (ref 44–60)
pH, Ven: 7.357 (ref 7.25–7.43)
pO2, Ven: 37 mmHg (ref 32–45)

## 2024-11-07 LAB — URINALYSIS, ROUTINE W REFLEX MICROSCOPIC
Bacteria, UA: NONE SEEN
Bilirubin Urine: NEGATIVE
Glucose, UA: >=500 mg/dL — AB
Hgb urine dipstick: NEGATIVE
Ketones, ur: 5 mg/dL — AB
Leukocytes,Ua: NEGATIVE
Nitrite: NEGATIVE
Protein, ur: NEGATIVE mg/dL
Specific Gravity, Urine: 1.031 — ABNORMAL HIGH (ref 1.005–1.030)
pH: 7 (ref 5.0–8.0)

## 2024-11-07 LAB — POCT I-STAT 7, (LYTES, BLD GAS, ICA,H+H)
Acid-base deficit: 4 mmol/L — ABNORMAL HIGH (ref 0.0–2.0)
Acid-base deficit: 4 mmol/L — ABNORMAL HIGH (ref 0.0–2.0)
Acid-base deficit: 5 mmol/L — ABNORMAL HIGH (ref 0.0–2.0)
Acid-base deficit: 6 mmol/L — ABNORMAL HIGH (ref 0.0–2.0)
Acid-base deficit: 6 mmol/L — ABNORMAL HIGH (ref 0.0–2.0)
Acid-base deficit: 5 mmol/L — ABNORMAL HIGH (ref 0.0–2.0)
Bicarbonate: 21.5 mmol/L (ref 20.0–28.0)
Bicarbonate: 20.6 mmol/L (ref 20.0–28.0)
Bicarbonate: 21.7 mmol/L (ref 20.0–28.0)
Bicarbonate: 19.6 mmol/L — ABNORMAL LOW (ref 20.0–28.0)
Bicarbonate: 18.8 mmol/L — ABNORMAL LOW (ref 20.0–28.0)
Bicarbonate: 18.5 mmol/L — ABNORMAL LOW (ref 20.0–28.0)
Calcium, Ion: 1.16 mmol/L (ref 1.15–1.40)
Calcium, Ion: 1.01 mmol/L — ABNORMAL LOW (ref 1.15–1.40)
Calcium, Ion: 1.03 mmol/L — ABNORMAL LOW (ref 1.15–1.40)
Calcium, Ion: 1.09 mmol/L — ABNORMAL LOW (ref 1.15–1.40)
Calcium, Ion: 1.05 mmol/L — ABNORMAL LOW (ref 1.15–1.40)
Calcium, Ion: 1.05 mmol/L — ABNORMAL LOW (ref 1.15–1.40)
HCT: 34 % — ABNORMAL LOW (ref 39.0–52.0)
HCT: 23 % — ABNORMAL LOW (ref 39.0–52.0)
HCT: 26 % — ABNORMAL LOW (ref 39.0–52.0)
HCT: 29 % — ABNORMAL LOW (ref 39.0–52.0)
HCT: 27 % — ABNORMAL LOW (ref 39.0–52.0)
HCT: 27 % — ABNORMAL LOW (ref 39.0–52.0)
Hemoglobin: 11.6 g/dL — ABNORMAL LOW (ref 13.0–17.0)
Hemoglobin: 7.8 g/dL — ABNORMAL LOW (ref 13.0–17.0)
Hemoglobin: 8.8 g/dL — ABNORMAL LOW (ref 13.0–17.0)
Hemoglobin: 9.9 g/dL — ABNORMAL LOW (ref 13.0–17.0)
Hemoglobin: 9.2 g/dL — ABNORMAL LOW (ref 13.0–17.0)
Hemoglobin: 9.2 g/dL — ABNORMAL LOW (ref 13.0–17.0)
O2 Saturation: 100 %
O2 Saturation: 100 %
O2 Saturation: 100 %
O2 Saturation: 100 %
O2 Saturation: 100 %
O2 Saturation: 99 %
Patient temperature: 98.4
Patient temperature: 37.3
Patient temperature: 37.5
Potassium: 3.2 mmol/L — ABNORMAL LOW (ref 3.5–5.1)
Potassium: 4.6 mmol/L (ref 3.5–5.1)
Potassium: 5.1 mmol/L (ref 3.5–5.1)
Potassium: 4.2 mmol/L (ref 3.5–5.1)
Potassium: 4.1 mmol/L (ref 3.5–5.1)
Potassium: 3.9 mmol/L (ref 3.5–5.1)
Sodium: 142 mmol/L (ref 135–145)
Sodium: 139 mmol/L (ref 135–145)
Sodium: 141 mmol/L (ref 135–145)
Sodium: 141 mmol/L (ref 135–145)
Sodium: 141 mmol/L (ref 135–145)
Sodium: 141 mmol/L (ref 135–145)
TCO2: 23 mmol/L (ref 22–32)
TCO2: 22 mmol/L (ref 22–32)
TCO2: 23 mmol/L (ref 22–32)
TCO2: 21 mmol/L — ABNORMAL LOW (ref 22–32)
TCO2: 20 mmol/L — ABNORMAL LOW (ref 22–32)
TCO2: 19 mmol/L — ABNORMAL LOW (ref 22–32)
pCO2 arterial: 39.8 mmHg (ref 32–48)
pCO2 arterial: 35 mmHg (ref 32–48)
pCO2 arterial: 45.8 mmHg (ref 32–48)
pCO2 arterial: 39.7 mmHg (ref 32–48)
pCO2 arterial: 31.9 mmHg — ABNORMAL LOW (ref 32–48)
pCO2 arterial: 30 mmHg — ABNORMAL LOW (ref 32–48)
pH, Arterial: 7.341 — ABNORMAL LOW (ref 7.35–7.45)
pH, Arterial: 7.283 — ABNORMAL LOW (ref 7.35–7.45)
pH, Arterial: 7.379 (ref 7.35–7.45)
pH, Arterial: 7.301 — ABNORMAL LOW (ref 7.35–7.45)
pH, Arterial: 7.38 (ref 7.35–7.45)
pH, Arterial: 7.402 (ref 7.35–7.45)
pO2, Arterial: 485 mmHg — ABNORMAL HIGH (ref 83–108)
pO2, Arterial: 352 mmHg — ABNORMAL HIGH (ref 83–108)
pO2, Arterial: 475 mmHg — ABNORMAL HIGH (ref 83–108)
pO2, Arterial: 406 mmHg — ABNORMAL HIGH (ref 83–108)
pO2, Arterial: 169 mmHg — ABNORMAL HIGH (ref 83–108)
pO2, Arterial: 123 mmHg — ABNORMAL HIGH (ref 83–108)

## 2024-11-07 LAB — POCT I-STAT, CHEM 8
BUN: 12 mg/dL (ref 8–23)
BUN: 14 mg/dL (ref 8–23)
BUN: 13 mg/dL (ref 8–23)
BUN: 13 mg/dL (ref 8–23)
Calcium, Ion: 1.09 mmol/L — ABNORMAL LOW (ref 1.15–1.40)
Calcium, Ion: 1.16 mmol/L (ref 1.15–1.40)
Calcium, Ion: 1.01 mmol/L — ABNORMAL LOW (ref 1.15–1.40)
Calcium, Ion: 1 mmol/L — ABNORMAL LOW (ref 1.15–1.40)
Chloride: 109 mmol/L (ref 98–111)
Chloride: 104 mmol/L (ref 98–111)
Chloride: 105 mmol/L (ref 98–111)
Chloride: 105 mmol/L (ref 98–111)
Creatinine, Ser: 0.9 mg/dL (ref 0.61–1.24)
Creatinine, Ser: 1.1 mg/dL (ref 0.61–1.24)
Creatinine, Ser: 0.9 mg/dL (ref 0.61–1.24)
Creatinine, Ser: 0.9 mg/dL (ref 0.61–1.24)
Glucose, Bld: 74 mg/dL (ref 70–99)
Glucose, Bld: 101 mg/dL — ABNORMAL HIGH (ref 70–99)
Glucose, Bld: 125 mg/dL — ABNORMAL HIGH (ref 70–99)
Glucose, Bld: 145 mg/dL — ABNORMAL HIGH (ref 70–99)
HCT: 34 % — ABNORMAL LOW (ref 39.0–52.0)
HCT: 33 % — ABNORMAL LOW (ref 39.0–52.0)
HCT: 26 % — ABNORMAL LOW (ref 39.0–52.0)
HCT: 26 % — ABNORMAL LOW (ref 39.0–52.0)
Hemoglobin: 11.6 g/dL — ABNORMAL LOW (ref 13.0–17.0)
Hemoglobin: 11.2 g/dL — ABNORMAL LOW (ref 13.0–17.0)
Hemoglobin: 8.8 g/dL — ABNORMAL LOW (ref 13.0–17.0)
Hemoglobin: 8.8 g/dL — ABNORMAL LOW (ref 13.0–17.0)
Potassium: 3 mmol/L — ABNORMAL LOW (ref 3.5–5.1)
Potassium: 3.7 mmol/L (ref 3.5–5.1)
Potassium: 4.9 mmol/L (ref 3.5–5.1)
Potassium: 4.6 mmol/L (ref 3.5–5.1)
Sodium: 143 mmol/L (ref 135–145)
Sodium: 140 mmol/L (ref 135–145)
Sodium: 139 mmol/L (ref 135–145)
Sodium: 140 mmol/L (ref 135–145)
TCO2: 21 mmol/L — ABNORMAL LOW (ref 22–32)
TCO2: 24 mmol/L (ref 22–32)
TCO2: 23 mmol/L (ref 22–32)
TCO2: 23 mmol/L (ref 22–32)

## 2024-11-07 LAB — PLATELET COUNT: Platelets: 109 K/uL — ABNORMAL LOW (ref 150–400)

## 2024-11-07 LAB — SURGICAL PCR SCREEN
MRSA, PCR: NEGATIVE
Staphylococcus aureus: NEGATIVE

## 2024-11-07 LAB — GLUCOSE, CAPILLARY
Glucose-Capillary: 101 mg/dL — ABNORMAL HIGH (ref 70–99)
Glucose-Capillary: 104 mg/dL — ABNORMAL HIGH (ref 70–99)
Glucose-Capillary: 104 mg/dL — ABNORMAL HIGH (ref 70–99)
Glucose-Capillary: 108 mg/dL — ABNORMAL HIGH (ref 70–99)
Glucose-Capillary: 108 mg/dL — ABNORMAL HIGH (ref 70–99)
Glucose-Capillary: 110 mg/dL — ABNORMAL HIGH (ref 70–99)
Glucose-Capillary: 113 mg/dL — ABNORMAL HIGH (ref 70–99)
Glucose-Capillary: 143 mg/dL — ABNORMAL HIGH (ref 70–99)
Glucose-Capillary: 154 mg/dL — ABNORMAL HIGH (ref 70–99)
Glucose-Capillary: 167 mg/dL — ABNORMAL HIGH (ref 70–99)
Glucose-Capillary: 72 mg/dL (ref 70–99)
Glucose-Capillary: 74 mg/dL (ref 70–99)
Glucose-Capillary: 82 mg/dL (ref 70–99)
Glucose-Capillary: 99 mg/dL (ref 70–99)

## 2024-11-07 LAB — MAGNESIUM: Magnesium: 3.2 mg/dL — ABNORMAL HIGH (ref 1.7–2.4)

## 2024-11-07 LAB — HEMOGLOBIN AND HEMATOCRIT, BLOOD
HCT: 24.3 % — ABNORMAL LOW (ref 39.0–52.0)
Hemoglobin: 8.2 g/dL — ABNORMAL LOW (ref 13.0–17.0)

## 2024-11-07 LAB — APTT: aPTT: 67 s — ABNORMAL HIGH (ref 24–36)

## 2024-11-07 LAB — LIPOPROTEIN A (LPA): Lipoprotein (a): 19.3 nmol/L (ref <75.0)

## 2024-11-07 LAB — HEPARIN LEVEL (UNFRACTIONATED): Heparin Unfractionated: >1.1 [IU]/mL — ABNORMAL HIGH (ref 0.30–0.70)

## 2024-11-07 SURGERY — CORONARY ARTERY BYPASS GRAFTING (CABG)
Anesthesia: General | Site: Chest

## 2024-11-07 MED ORDER — CEFAZOLIN SODIUM-DEXTROSE 2-4 GM/100ML-% IV SOLN
2.0000 g | Freq: Three times a day (TID) | INTRAVENOUS | Status: AC
  Administered 2024-11-07 – 2024-11-09 (×6): 2 g via INTRAVENOUS
  Filled 2024-11-07 (×6): qty 100

## 2024-11-07 MED ORDER — ASPIRIN 325 MG PO TBEC: 325.0000 mg | DELAYED_RELEASE_TABLET | Freq: Every day | ORAL | Status: DC

## 2024-11-07 MED ORDER — MIDAZOLAM HCL (PF) 10 MG/2ML IJ SOLN
INTRAMUSCULAR | Status: AC
  Filled 2024-11-07: qty 2

## 2024-11-07 MED ORDER — ACETAMINOPHEN 500 MG PO TABS
1000.0000 mg | ORAL_TABLET | Freq: Four times a day (QID) | ORAL | Status: DC
  Administered 2024-11-07 – 2024-11-12 (×11): 1000 mg via ORAL
  Filled 2024-11-07 (×11): qty 2

## 2024-11-07 MED ORDER — MORPHINE SULFATE (PF) 2 MG/ML IV SOLN
INTRAVENOUS | Status: AC
  Administered 2024-11-07: 4 mg via INTRAVENOUS
  Filled 2024-11-07: qty 2

## 2024-11-07 MED ORDER — PROTHROMBIN COMPLEX CONC HUMAN 500 UNITS IV KIT
3357.0000 [IU] | PACK | Status: AC
  Administered 2024-11-07: 3357 [IU] via INTRAVENOUS
  Filled 2024-11-07: qty 3357

## 2024-11-07 MED ORDER — MORPHINE SULFATE (PF) 2 MG/ML IV SOLN
1.0000 mg | INTRAVENOUS | Status: DC | PRN
  Administered 2024-11-07: 2 mg via INTRAVENOUS
  Filled 2024-11-07: qty 1
  Filled 2024-11-07: qty 2

## 2024-11-07 MED ORDER — POTASSIUM CHLORIDE 10 MEQ/50ML IV SOLN: 10.0000 meq | INTRAVENOUS | Status: AC

## 2024-11-07 MED ORDER — PHENYLEPHRINE 80 MCG/ML (10ML) SYRINGE FOR IV PUSH (FOR BLOOD PRESSURE SUPPORT)
PREFILLED_SYRINGE | INTRAVENOUS | Status: DC | PRN
  Administered 2024-11-07 (×4): 80 ug via INTRAVENOUS

## 2024-11-07 MED ORDER — ACETAMINOPHEN 160 MG/5ML PO SOLN
1000.0000 mg | Freq: Four times a day (QID) | ORAL | Status: DC
  Administered 2024-11-08 (×2): 1000 mg
  Filled 2024-11-07 (×2): qty 40.6

## 2024-11-07 MED ORDER — SODIUM CHLORIDE 0.9% FLUSH: 3.0000 mL | INTRAVENOUS | Status: DC | PRN

## 2024-11-07 MED ORDER — ORAL CARE MOUTH RINSE
15.0000 mL | Freq: Once | OROMUCOSAL | Status: AC
Start: 2024-11-07 — End: 2024-11-07

## 2024-11-07 MED ORDER — FENTANYL CITRATE (PF) 250 MCG/5ML IJ SOLN
INTRAMUSCULAR | Status: AC
  Filled 2024-11-07: qty 5

## 2024-11-07 MED ORDER — FENTANYL CITRATE (PF) 250 MCG/5ML IJ SOLN
INTRAMUSCULAR | Status: DC | PRN
  Administered 2024-11-07: 150 ug via INTRAVENOUS
  Administered 2024-11-07 (×2): 100 ug via INTRAVENOUS
  Administered 2024-11-07: 50 ug via INTRAVENOUS
  Administered 2024-11-07 (×3): 100 ug via INTRAVENOUS

## 2024-11-07 MED ORDER — CHLORHEXIDINE GLUCONATE 0.12 % MT SOLN
15.0000 mL | OROMUCOSAL | Status: AC
  Administered 2024-11-07: 15 mL via OROMUCOSAL
  Filled 2024-11-07: qty 15

## 2024-11-07 MED ORDER — SODIUM CHLORIDE 0.45 % IV SOLN: INTRAVENOUS | Status: AC | PRN

## 2024-11-07 MED ORDER — CALCIUM GLUCONATE-NACL 1-0.675 GM/50ML-% IV SOLN
1.0000 g | Freq: Once | INTRAVENOUS | Status: AC
  Administered 2024-11-07: 1000 mg via INTRAVENOUS
  Filled 2024-11-07: qty 50

## 2024-11-07 MED ORDER — ONDANSETRON HCL 4 MG/2ML IJ SOLN
4.0000 mg | Freq: Four times a day (QID) | INTRAMUSCULAR | Status: DC | PRN
  Administered 2024-11-08 – 2024-11-09 (×3): 4 mg via INTRAVENOUS
  Filled 2024-11-07 (×3): qty 2

## 2024-11-07 MED ORDER — INSULIN REGULAR(HUMAN) IN NACL 100-0.9 UT/100ML-% IV SOLN
INTRAVENOUS | Status: DC
  Filled 2024-11-07: qty 100

## 2024-11-07 MED ORDER — PHENYLEPHRINE HCL-NACL 20-0.9 MG/250ML-% IV SOLN: 0.0000 ug/min | INTRAVENOUS | Status: DC

## 2024-11-07 MED ORDER — OXYCODONE HCL 5 MG PO TABS
5.0000 mg | ORAL_TABLET | ORAL | Status: DC | PRN
  Administered 2024-11-07: 5 mg via ORAL
  Administered 2024-11-08: 10 mg via ORAL
  Filled 2024-11-07: qty 2
  Filled 2024-11-07: qty 1

## 2024-11-07 MED ORDER — ASPIRIN 81 MG PO CHEW
324.0000 mg | CHEWABLE_TABLET | Freq: Every day | ORAL | Status: DC
  Administered 2024-11-08: 324 mg
  Filled 2024-11-07: qty 4

## 2024-11-07 MED ORDER — METOPROLOL TARTRATE 12.5 MG HALF TABLET
12.5000 mg | ORAL_TABLET | Freq: Two times a day (BID) | ORAL | Status: DC
  Administered 2024-11-08 – 2024-11-09 (×3): 12.5 mg via ORAL
  Filled 2024-11-07 (×4): qty 1

## 2024-11-07 MED ORDER — PANTOPRAZOLE SODIUM 40 MG IV SOLR
40.0000 mg | Freq: Every day | INTRAVENOUS | Status: AC
  Administered 2024-11-07 – 2024-11-08 (×2): 40 mg via INTRAVENOUS
  Filled 2024-11-07 (×2): qty 10

## 2024-11-07 MED ORDER — 0.9 % SODIUM CHLORIDE (POUR BTL) OPTIME
TOPICAL | Status: DC | PRN
  Administered 2024-11-07: 5000 mL

## 2024-11-07 MED ORDER — SODIUM CHLORIDE 0.9% FLUSH
3.0000 mL | Freq: Two times a day (BID) | INTRAVENOUS | Status: DC
  Administered 2024-11-08 – 2024-11-09 (×3): 3 mL via INTRAVENOUS

## 2024-11-07 MED ORDER — VANCOMYCIN HCL IN DEXTROSE 1-5 GM/200ML-% IV SOLN
INTRAVENOUS | Status: AC
  Filled 2024-11-07: qty 200

## 2024-11-07 MED ORDER — MIDAZOLAM HCL (PF) 2 MG/2ML IJ SOLN
2.0000 mg | INTRAMUSCULAR | Status: DC | PRN
  Filled 2024-11-07: qty 2

## 2024-11-07 MED ORDER — METOCLOPRAMIDE HCL 5 MG/ML IJ SOLN
10.0000 mg | Freq: Four times a day (QID) | INTRAMUSCULAR | Status: AC
  Administered 2024-11-07 – 2024-11-08 (×6): 10 mg via INTRAVENOUS
  Filled 2024-11-07 (×6): qty 2

## 2024-11-07 MED ORDER — DEXMEDETOMIDINE HCL IN NACL 400 MCG/100ML IV SOLN: 0.0000 ug/kg/h | INTRAVENOUS | Status: DC

## 2024-11-07 MED ORDER — CHLORHEXIDINE GLUCONATE CLOTH 2 % EX PADS
6.0000 | MEDICATED_PAD | Freq: Every day | CUTANEOUS | Status: DC
  Administered 2024-11-07 – 2024-11-08 (×2): 6 via TOPICAL

## 2024-11-07 MED ORDER — SODIUM BICARBONATE 8.4 % IV SOLN
50.0000 meq | Freq: Once | INTRAVENOUS | Status: AC
  Administered 2024-11-07: 50 meq via INTRAVENOUS

## 2024-11-07 MED ORDER — TRAMADOL HCL 50 MG PO TABS
50.0000 mg | ORAL_TABLET | ORAL | Status: DC | PRN
  Administered 2024-11-07: 50 mg via ORAL
  Filled 2024-11-07: qty 1

## 2024-11-07 MED ORDER — LACTATED RINGERS IV SOLN: INTRAVENOUS | Status: AC

## 2024-11-07 MED ORDER — LACTATED RINGERS IV SOLN: INTRAVENOUS | Status: DC | PRN

## 2024-11-07 MED ORDER — HEMOSTATIC AGENTS (NO CHARGE) OPTIME
TOPICAL | Status: DC | PRN
  Administered 2024-11-07: 1 via TOPICAL

## 2024-11-07 MED ORDER — CHLORHEXIDINE GLUCONATE 0.12 % MT SOLN
OROMUCOSAL | Status: AC
  Filled 2024-11-07: qty 15

## 2024-11-07 MED ORDER — ALBUMIN HUMAN 5 % IV SOLN
250.0000 mL | INTRAVENOUS | Status: AC | PRN
  Administered 2024-11-07 (×5): 12.5 g via INTRAVENOUS
  Filled 2024-11-07 (×3): qty 250

## 2024-11-07 MED ORDER — SODIUM CHLORIDE 0.9 % IV SOLN: INTRAVENOUS | Status: DC | PRN

## 2024-11-07 MED ORDER — PROPOFOL 10 MG/ML IV BOLUS
INTRAVENOUS | Status: DC | PRN
  Administered 2024-11-07: 70 mg via INTRAVENOUS

## 2024-11-07 MED ORDER — HEPARIN SODIUM (PORCINE) 1000 UNIT/ML IJ SOLN
INTRAMUSCULAR | Status: DC | PRN
  Administered 2024-11-07: 12000 [IU] via INTRAVENOUS
  Administered 2024-11-07: 15000 [IU] via INTRAVENOUS

## 2024-11-07 MED ORDER — PROTAMINE SULFATE 10 MG/ML IV SOLN
INTRAVENOUS | Status: DC | PRN
Start: 2024-11-07 — End: 2024-11-07
  Administered 2024-11-07: 280 mg via INTRAVENOUS

## 2024-11-07 MED ORDER — BISACODYL 10 MG RE SUPP: 10.0000 mg | Freq: Every day | RECTAL | Status: DC

## 2024-11-07 MED ORDER — VANCOMYCIN HCL IN DEXTROSE 1-5 GM/200ML-% IV SOLN
1000.0000 mg | Freq: Once | INTRAVENOUS | Status: AC
  Administered 2024-11-07: 1000 mg via INTRAVENOUS
  Filled 2024-11-07: qty 200

## 2024-11-07 MED ORDER — ROCURONIUM BROMIDE 10 MG/ML (PF) SYRINGE
PREFILLED_SYRINGE | INTRAVENOUS | Status: DC | PRN
  Administered 2024-11-07: 80 mg via INTRAVENOUS
  Administered 2024-11-07: 50 mg via INTRAVENOUS
  Administered 2024-11-07: 20 mg via INTRAVENOUS

## 2024-11-07 MED ORDER — SODIUM CHLORIDE 0.9 % IV SOLN: INTRAVENOUS | Status: AC

## 2024-11-07 MED ORDER — SODIUM CHLORIDE 0.9 % IV SOLN: 250.0000 mL | INTRAVENOUS | Status: DC

## 2024-11-07 MED ORDER — PHENYLEPHRINE HCL-NACL 20-0.9 MG/250ML-% IV SOLN
INTRAVENOUS | Status: DC | PRN
Start: 2024-11-07 — End: 2024-11-07
  Administered 2024-11-07: 25 ug/min via INTRAVENOUS

## 2024-11-07 MED ORDER — MIDAZOLAM HCL (PF) 5 MG/ML IJ SOLN
INTRAMUSCULAR | Status: DC | PRN
  Administered 2024-11-07: 2 mg via INTRAVENOUS

## 2024-11-07 MED ORDER — CLEVIDIPINE BUTYRATE 0.5 MG/ML IV EMUL
INTRAVENOUS | Status: AC
Start: 2024-11-07 — End: 2024-11-07
  Filled 2024-11-07: qty 50

## 2024-11-07 MED ORDER — DEXTROSE 50 % IV SOLN: 0.0000 mL | INTRAVENOUS | Status: DC | PRN

## 2024-11-07 MED ORDER — PROPOFOL 10 MG/ML IV BOLUS
INTRAVENOUS | Status: AC
  Filled 2024-11-07: qty 20

## 2024-11-07 MED ORDER — METOPROLOL TARTRATE 5 MG/5ML IV SOLN
2.5000 mg | INTRAVENOUS | Status: DC | PRN
  Administered 2024-11-10: 5 mg via INTRAVENOUS
  Filled 2024-11-07 (×2): qty 5

## 2024-11-07 MED ORDER — LACTATED RINGERS IV SOLN: INTRAVENOUS | Status: DC

## 2024-11-07 MED ORDER — SODIUM CHLORIDE (PF) 0.9 % IJ SOLN: OROMUCOSAL | Status: DC | PRN

## 2024-11-07 MED ORDER — CHLORHEXIDINE GLUCONATE 0.12 % MT SOLN
15.0000 mL | Freq: Once | OROMUCOSAL | Status: AC
  Administered 2024-11-07: 15 mL via OROMUCOSAL

## 2024-11-07 MED ORDER — PANTOPRAZOLE SODIUM 40 MG PO TBEC
40.0000 mg | DELAYED_RELEASE_TABLET | Freq: Every day | ORAL | Status: DC
  Administered 2024-11-09 – 2024-11-12 (×4): 40 mg via ORAL
  Filled 2024-11-07 (×4): qty 1

## 2024-11-07 MED ORDER — ASPIRIN 81 MG PO CHEW
324.0000 mg | CHEWABLE_TABLET | Freq: Once | ORAL | Status: AC
  Administered 2024-11-07: 324 mg via ORAL
  Filled 2024-11-07: qty 4

## 2024-11-07 MED ORDER — ACETAMINOPHEN 160 MG/5ML PO SOLN
650.0000 mg | Freq: Once | ORAL | Status: DC
  Filled 2024-11-07: qty 20.3

## 2024-11-07 MED ORDER — METOPROLOL TARTRATE 25 MG/10 ML ORAL SUSPENSION: 12.5000 mg | Freq: Two times a day (BID) | ORAL | Status: DC

## 2024-11-07 MED ORDER — DOCUSATE SODIUM 100 MG PO CAPS
200.0000 mg | ORAL_CAPSULE | Freq: Every day | ORAL | Status: DC
  Administered 2024-11-08 – 2024-11-12 (×4): 200 mg via ORAL
  Filled 2024-11-07 (×4): qty 2

## 2024-11-07 MED ORDER — BISACODYL 5 MG PO TBEC
10.0000 mg | DELAYED_RELEASE_TABLET | Freq: Every day | ORAL | Status: DC
  Administered 2024-11-08 – 2024-11-09 (×2): 10 mg via ORAL
  Filled 2024-11-07 (×2): qty 2

## 2024-11-07 MED ORDER — SUGAMMADEX SODIUM 200 MG/2ML IV SOLN
INTRAVENOUS | Status: DC | PRN
  Administered 2024-11-07: 200 mg via INTRAVENOUS

## 2024-11-07 MED ORDER — MAGNESIUM SULFATE 4 GM/100ML IV SOLN
4.0000 g | Freq: Once | INTRAVENOUS | Status: AC
  Administered 2024-11-07: 4 g via INTRAVENOUS
  Filled 2024-11-07: qty 100

## 2024-11-07 MED ORDER — ALBUMIN HUMAN 5 % IV SOLN: INTRAVENOUS | Status: DC | PRN

## 2024-11-07 MED ORDER — VASOPRESSIN 20 UNIT/ML IV SOLN
INTRAVENOUS | Status: AC
  Filled 2024-11-07: qty 1

## 2024-11-07 SURGICAL SUPPLY — 80 items
ADAPTER MULTI PERFUSION 15 (ADAPTER) ×3 IMPLANT
BAG DECANTER FOR FLEXI CONT (MISCELLANEOUS) ×3 IMPLANT
BLADE CLIPPER SURG (BLADE) ×3 IMPLANT
BLADE STERNUM SYSTEM 6 (BLADE) ×3 IMPLANT
BLOWER MISTER CAL-MED (MISCELLANEOUS) IMPLANT
BNDG ELASTIC 4X5.8 VLCR STR LF (GAUZE/BANDAGES/DRESSINGS) ×3 IMPLANT
BNDG ELASTIC 6INX 5YD STR LF (GAUZE/BANDAGES/DRESSINGS) ×3 IMPLANT
BNDG GAUZE DERMACEA FLUFF 4 (GAUZE/BANDAGES/DRESSINGS) ×3 IMPLANT
CANISTER SUCTION 3000ML PPV (SUCTIONS) ×3 IMPLANT
CANNULA AORTIC ROOT 9FR (CANNULA) ×3 IMPLANT
CANNULA EZ GLIDE AORTIC 21FR (CANNULA) IMPLANT
CANNULA MC2 2 STG 36/46 NON-V (CANNULA) IMPLANT
CANNULA VESSEL 3MM BLUNT TIP (CANNULA) ×9 IMPLANT
CATH ROBINSON RED A/P 18FR (CATHETERS) ×3 IMPLANT
CATH THORACIC 36FR (CATHETERS) ×3 IMPLANT
CATH THORACIC 36FR RT ANG (CATHETERS) ×3 IMPLANT
CLIP CORONARY ARTERY RETRACTOR (MISCELLANEOUS) IMPLANT
CLIP TI MEDIUM 24 (CLIP) IMPLANT
CLIP TI WIDE RED SMALL 24 (CLIP) IMPLANT
CONTAINER PROTECT SURGISLUSH (MISCELLANEOUS) ×6 IMPLANT
DRAPE SRG 135X102X78XABS (DRAPES) ×3 IMPLANT
DRAPE WARM FLUID 44X44 (DRAPES) ×3 IMPLANT
DRSG COVADERM 4X14 (GAUZE/BANDAGES/DRESSINGS) ×3 IMPLANT
ELECTRODE REM PT RTRN 9FT ADLT (ELECTROSURGICAL) ×6 IMPLANT
FELT TEFLON 1X6 (MISCELLANEOUS) ×3 IMPLANT
GAUZE SPONGE 4X4 12PLY STRL (GAUZE/BANDAGES/DRESSINGS) ×6 IMPLANT
GLOVE SS BIOGEL STRL SZ 7.5 (GLOVE) ×3 IMPLANT
GOWN STRL REUS W/ TWL LRG LVL3 (GOWN DISPOSABLE) ×12 IMPLANT
GOWN STRL REUS W/ TWL XL LVL3 (GOWN DISPOSABLE) ×6 IMPLANT
HEMOSTAT POWDER SURGIFOAM 1G (HEMOSTASIS) ×12 IMPLANT
HEMOSTAT SURGICEL 2X14 (HEMOSTASIS) ×3 IMPLANT
KIT BASIN OR (CUSTOM PROCEDURE TRAY) ×3 IMPLANT
KIT SUCTION CATH 14FR (SUCTIONS) ×6 IMPLANT
KIT TURNOVER KIT B (KITS) ×3 IMPLANT
KIT VASOVIEW HEMOPRO 2 VH 4000 (KITS) ×3 IMPLANT
LINE EXTENSION DELIVERY (MISCELLANEOUS) IMPLANT
MARKER GRAFT CORONARY BYPASS (MISCELLANEOUS) ×9 IMPLANT
OFFPUMP STABILIZER SUV (MISCELLANEOUS) IMPLANT
PACK E OPEN HEART (SUTURE) ×3 IMPLANT
PACK OPEN HEART (CUSTOM PROCEDURE TRAY) ×3 IMPLANT
PAD ARMBOARD POSITIONER FOAM (MISCELLANEOUS) ×6 IMPLANT
PAD ELECT DEFIB RADIOL ZOLL (MISCELLANEOUS) ×3 IMPLANT
PENCIL BUTTON HOLSTER BLD 10FT (ELECTRODE) ×3 IMPLANT
POSITIONER ACROBAT-I OFFPUMP (MISCELLANEOUS) IMPLANT
POSITIONER HEAD DONUT 9IN (MISCELLANEOUS) ×3 IMPLANT
PUNCH AORTIC ROTATE 4.0MM (MISCELLANEOUS) IMPLANT
PUNCH AORTIC ROTATE 4.5MM 8IN (MISCELLANEOUS) IMPLANT
PUNCH AORTIC ROTATE 5MM 8IN (MISCELLANEOUS) IMPLANT
SET MPS 3-ND DEL (MISCELLANEOUS) IMPLANT
SOLN 0.9% NACL POUR BTL 1000ML (IV SOLUTION) ×15 IMPLANT
SOLN STERILE WATER BTL 1000 ML (IV SOLUTION) ×6 IMPLANT
SPONGE INTESTINAL PEANUT (DISPOSABLE) IMPLANT
SUPPORT HEART JANKE-BARRON (MISCELLANEOUS) ×3 IMPLANT
SUT BONE WAX W31G (SUTURE) ×3 IMPLANT
SUT MNCRL AB 4-0 PS2 18 (SUTURE) IMPLANT
SUT PROLENE 3 0 SH DA (SUTURE) ×3 IMPLANT
SUT PROLENE 4 0 SH DA (SUTURE) IMPLANT
SUT PROLENE 4-0 RB1 .5 CRCL 36 (SUTURE) ×3 IMPLANT
SUT PROLENE 5 0 C 1 36 (SUTURE) IMPLANT
SUT PROLENE 6 0 C 1 30 (SUTURE) ×6 IMPLANT
SUT PROLENE 7 0 BV 1 (SUTURE) IMPLANT
SUT PROLENE 7 0 BV1 MDA (SUTURE) ×3 IMPLANT
SUT PROLENE 8 0 BV175 6 (SUTURE) IMPLANT
SUT SILK 1 MH (SUTURE) IMPLANT
SUT STEEL 6MS V (SUTURE) ×3 IMPLANT
SUT STEEL SZ 6 DBL 3X14 BALL (SUTURE) ×3 IMPLANT
SUT VIC AB 1 CTX36XBRD ANBCTR (SUTURE) ×6 IMPLANT
SUT VIC AB 2-0 CT1 TAPERPNT 27 (SUTURE) IMPLANT
SUT VIC AB 2-0 CTX 27 (SUTURE) IMPLANT
SUT VIC AB 3-0 SH 27X BRD (SUTURE) IMPLANT
SYSTEM SAHARA CHEST DRAIN ATS (WOUND CARE) ×3 IMPLANT
TAPE CLOTH SURG 4X10 WHT LF (GAUZE/BANDAGES/DRESSINGS) IMPLANT
TAPE PAPER 2X10 WHT MICROPORE (GAUZE/BANDAGES/DRESSINGS) IMPLANT
TOWEL GREEN STERILE (TOWEL DISPOSABLE) ×3 IMPLANT
TOWEL GREEN STERILE FF (TOWEL DISPOSABLE) ×3 IMPLANT
TRAY FOLEY SLVR 16FR TEMP STAT (SET/KITS/TRAYS/PACK) ×3 IMPLANT
TUBE SUCT INTRACARD DLP 20F (MISCELLANEOUS) ×3 IMPLANT
TUBE SUCTION CARDIAC 10FR (CANNULA) ×3 IMPLANT
TUBING LAP HI FLOW INSUFFLATIO (TUBING) ×3 IMPLANT
UNDERPAD 30X36 HEAVY ABSORB (UNDERPADS AND DIAPERS) ×3 IMPLANT

## 2024-11-07 NOTE — Progress Notes (Signed)
   [address] [place] [address]             [phone]   S/p CABG  Extubated BP 104/67   Pulse 79   Temp 99.1 F (37.3 C)   Resp 17   Ht 5' 10 (1.778 m)   Wt 74.8 kg   SpO2 100%   BMI 23.68 kg/m   CI 2.2 CVP 3   Intake/Output Summary (Last 24 hours) at 11/07/2024 1833 Last data filed at 11/07/2024 1700 Gross per 24 hour  Intake 5898.14 ml  Output 2141 ml  Net 3757.14 ml  Minimal CT output  Hgb 11.1, PLT 128 OG in right lower lobe bronchus on initial CXR- removed  Looks good  Elspeth C. Kerrin, MD [facility] [phone]

## 2024-11-07 NOTE — Interval H&P Note (Signed)
 History and Physical Interval Note:  11/07/2024 7:32 AM  Frederick Miles  has presented today for surgery, with the diagnosis of coronary artery disease.  The various methods of treatment have been discussed with the patient and family. After consideration of risks, benefits and other options for treatment, the patient has consented to  Procedure(s) with comments: CORONARY ARTERY BYPASS GRAFTING (CABG) (N/A) - CORONARY ARTERY BYPASS GRAFTING TIMES ONE POSSIBLE OFF PUMP ECHOCARDIOGRAM, TRANSESOPHAGEAL, INTRAOPERATIVE (N/A) as a surgical intervention.  The patient's history has been reviewed, patient examined, no change in status, stable for surgery.  I have reviewed the patient's chart and labs.  Questions were answered to the patient's satisfaction.     Frederick Miles

## 2024-11-07 NOTE — Progress Notes (Signed)
 Extubation Procedure Note  Patient Details:   Name: Frederick Miles DOB: 1946/05/18 MRN: [id number]   Airway Documentation:    Vent end date: 11/07/24 Vent end time: 1640   Evaluation  O2 sats: stable throughout Complications: No apparent complications Patient did tolerate procedure well. Bilateral Breath Sounds: (P) Clear, Diminished   Yes Patient extubated to 2 L [place] at this time per SIMV protocol order. Patient able to say name, patient with cuff leak, no stridor noted.  Patient instructed on use of IS ( ) Patient nif -25, VC 683. RT will continue to monitor.    11/07/24 1620  Therapy Vitals  Temp 99.1 F (37.3 C)  Pulse Rate 79  Resp 20  MEWS Score/Color  MEWS Score 1  MEWS Score Color Green  Respiratory Assessment  Assessment Type Assess only  Respiratory Pattern Regular;Unlabored  Chest Assessment Chest expansion symmetrical  Cough Non-productive;Weak  Bilateral Breath Sounds Clear;Diminished  Oxygen Therapy/Pulse Ox  O2 Device Nasal Cannula  SpO2 100 %  O2 Therapy Oxygen humidified  O2 Flow Rate (L/min) 2 L/min     Maryelizabeth DELENA Sprang 11/07/2024, 4:31 PM

## 2024-11-07 NOTE — Anesthesia Procedure Notes (Signed)
 Procedure Name: Intubation Date/Time: 11/07/2024 8:23 AM  Performed by: Arvell Edsel HERO, CRNAPre-anesthesia Checklist: Patient identified, Emergency Drugs available, Patient being monitored, Suction available and Timeout performed Patient Re-evaluated:Patient Re-evaluated prior to induction Oxygen Delivery Method: Circle system utilized Preoxygenation: Pre-oxygenation with 100% oxygen Induction Type: IV induction Ventilation: Oral airway inserted - appropriate to patient size and Two handed mask ventilation required Laryngoscope Size: Mac and 3 Grade View: Grade II Tube type: Oral Tube size: 7.0 mm Number of attempts: 1 Airway Equipment and Method: Patient positioned with wedge pillow and Stylet Placement Confirmation: ETT inserted through vocal cords under direct vision, positive ETCO2 and breath sounds checked- equal and bilateral Secured at: 23 cm Tube secured with: Tape Dental Injury: Teeth and Oropharynx as per pre-operative assessment

## 2024-11-07 NOTE — Transfer of Care (Signed)
 Immediate Anesthesia Transfer of Care Note  Patient: Frederick Miles  Procedure(s) Performed: CORONARY ARTERY BYPASS GRAFTING (CABG) TIMES ONE USING LEFT INTERNAL MAMMARY ARTERY (Chest) ECHOCARDIOGRAM, TRANSESOPHAGEAL, INTRAOPERATIVE  Patient Location: PACU  Anesthesia Type:General  Level of Consciousness: sedated and Patient remains intubated per anesthesia plan  Airway & Oxygen Therapy: Patient remains intubated per anesthesia plan and Patient placed on Ventilator (see vital sign flow sheet for setting)  Post-op Assessment: Report given to RN and Post -op Vital signs reviewed and stable  Post vital signs: Reviewed and stable  Last Vitals:  Vitals Value Taken Time  BP 102/54 11/07/24 12:39  Temp    Pulse 54 11/07/24 12:42  Resp 12 11/07/24 12:42  SpO2 100 % 11/07/24 12:42  Vitals shown include unfiled device data.  Last Pain:  Vitals:   11/07/24 0717  TempSrc: Oral  PainSc: 0-No pain      Patients Stated Pain Goal: 0 (11/06/24 2145)  Complications: No notable events documented.

## 2024-11-07 NOTE — Plan of Care (Signed)
  Problem: Nutritional: Goal: Maintenance of adequate nutrition will improve Outcome: Progressing   Problem: Cardiac: Goal: Ability to achieve and maintain adequate cardiovascular perfusion will improve Outcome: Progressing

## 2024-11-07 NOTE — Brief Op Note (Signed)
 11/07/2024  1:03 PM  PATIENT:  Frederick Miles  78 y.o. male  PRE-OPERATIVE DIAGNOSIS:  coronary artery disease  POST-OPERATIVE DIAGNOSIS:  CAD  PROCEDURE:  Procedure(s): CORONARY ARTERY BYPASS GRAFTING (CABG) TIMES ONE USING LEFT INTERNAL MAMMARY ARTERY (N/A) ECHOCARDIOGRAM, TRANSESOPHAGEAL, INTRAOPERATIVE (N/A)   SURGEON:  Surgeons and Role:    * Kerrin Elspeth BROCKS, MD - Primary  PHYSICIAN ASSISTANT:   ASSISTANTS: Con Bend, PA   ANESTHESIA:   general  EBL:  945 mL   BLOOD ADMINISTERED:none  DRAINS: 2 Chest Tube(s) in the med/ left pleural space   LOCAL MEDICATIONS USED:  NONE  SPECIMEN:  No Specimen  DISPOSITION OF SPECIMEN:  N/A  COUNTS:  YES  TOURNIQUET:  * No tourniquets in log *  DICTATION: .Other Dictation: Dictation Number -  PLAN OF CARE: inpatient  PATIENT DISPOSITION:  ICU - intubated and hemodynamically stable.   Delay start of Pharmacological VTE agent (>24hrs) due to surgical blood loss or risk of bleeding: yes

## 2024-11-07 NOTE — Anesthesia Preprocedure Evaluation (Signed)
 Anesthesia Evaluation  Patient identified by MRN, date of birth, ID band Patient awake    Reviewed: Allergy & Precautions, NPO status , Patient's Chart, lab work & pertinent test results  History of Anesthesia Complications Negative for: history of anesthetic complications  Airway Mallampati: III  TM Distance: <3 FB Neck ROM: Limited  Mouth opening: Limited Mouth Opening  Dental  (+) Teeth Intact, Dental Advisory Given   Pulmonary neg shortness of breath, neg sleep apnea, COPD, neg recent URI, former smoker   breath sounds clear to auscultation       Cardiovascular hypertension, + CAD, + Past MI and + Peripheral Vascular Disease   Rhythm:Regular   1. Left ventricular ejection fraction, by estimation, is 60 to 65%. The  left ventricle has normal function. The left ventricle has no regional  wall motion abnormalities. Left ventricular diastolic parameters were  normal.   2. Right ventricular systolic function is normal. The right ventricular  size is normal.   3. The mitral valve is normal in structure. No evidence of mitral valve  regurgitation. No evidence of mitral stenosis.   4. The aortic valve is tricuspid. Aortic valve regurgitation is not  visualized. Aortic valve sclerosis is present, with no evidence of aortic  valve stenosis.   5. The inferior vena cava is normal in size with greater than 50%  respiratory variability, suggesting right atrial pressure of 3 mmHg.      Ost LAD to Prox LAD lesion is 99% stenosed.   1.  High-grade edge ISR proximal LAD stent.  Given previous stent failure and proximity to left main, will obtain cardiothoracic surgical consultation. 2.  LVEDP of 28 mmHg.   Recommendation: Restart heparin infusion 2 hours after TR band has been weaned.  Results reviewed with patient, patient's wife, Cardiology Team A staff, and hospital medicine.  I reviewed the findings with Dr. Kerrin who will see the  patient for consideration of surgical revascularization.       Neuro/Psych neg Seizures    GI/Hepatic Neg liver ROS,GERD  Medicated and Controlled,,  Endo/Other  diabetes, Type 2, Insulin  DependentHypothyroidism  Lab Results      Component                Value               Date                      HGBA1C                   6.4 (H)             11/06/2024             Renal/GU CRFRenal diseaseLab Results      Component                Value               Date                      NA                       138                 11/07/2024                K  4.5                 11/07/2024                CO2                      20 (L)              11/07/2024                GLUCOSE                  86                  11/07/2024                BUN                      15                  11/07/2024                CREATININE               1.26 (H)            11/07/2024                CALCIUM                  8.4 (L)             11/07/2024                GFR                      64.66               07/05/2015                EGFR                     54 (L)              10/12/2024                GFRNONAA                 59 (L)              11/07/2024                Musculoskeletal negative musculoskeletal ROS (+)    Abdominal   Peds  Hematology negative hematology ROS (+) Lab Results      Component                Value               Date                      WBC                      4.8                 11/06/2024                HGB                      14.3  11/06/2024                HCT                      41.5                11/06/2024                MCV                      95.2                11/06/2024                PLT                      171                 11/06/2024              Anesthesia Other Findings   Reproductive/Obstetrics                              Anesthesia Physical Anesthesia Plan  ASA:  4  Anesthesia Plan: General   Post-op Pain Management:    Induction: Intravenous  PONV Risk Score and Plan: 3 and Ondansetron   Airway Management Planned: Oral ETT  Additional Equipment: Arterial line, CVP, TEE and Ultrasound Guidance Line Placement  Intra-op Plan:   Post-operative Plan: Post-operative intubation/ventilation  Informed Consent: I have reviewed the patients History and Physical, chart, labs and discussed the procedure including the risks, benefits and alternatives for the proposed anesthesia with the patient or authorized representative who has indicated his/her understanding and acceptance.     Dental advisory given  Plan Discussed with: CRNA  Anesthesia Plan Comments:          Anesthesia Quick Evaluation

## 2024-11-07 NOTE — Brief Op Note (Deleted)
 11/07/2024  2:06 PM  PATIENT:  Frederick Miles  78 y.o. male  PRE-OPERATIVE DIAGNOSIS:  coronary artery disease  POST-OPERATIVE DIAGNOSIS:  CAD  PROCEDURE:  CORONARY ARTERY BYPASS GRAFTING (CABG) TIMES ONE USING LEFT INTERNAL MAMMARY ARTERY ECHOCARDIOGRAM, TRANSESOPHAGEAL, INTRAOPERATIVE -LIMA to LAD   SURGEON:  Surgeons and Role:    * Kerrin Elspeth BROCKS, MD - Primary  PHYSICIAN ASSISTANT: Con Bend PA-C  ASSISTANTS: Evalene Domino RNFA   ANESTHESIA:   general  EBL:  945 mL   BLOOD ADMINISTERED: Per perfusion records  DRAINS: Mediastinal drains   LOCAL MEDICATIONS USED:  NONE  SPECIMEN:  No Specimen  DISPOSITION OF SPECIMEN:  N/A  COUNTS:  YES  TOURNIQUET:  * No tourniquets in log *  DICTATION: .Dragon Dictation  PLAN OF CARE: Admit to inpatient   PATIENT DISPOSITION:  ICU - intubated and hemodynamically stable.   Delay start of Pharmacological VTE agent (>24hrs) due to surgical blood loss or risk of bleeding: yes

## 2024-11-07 NOTE — Anesthesia Procedure Notes (Signed)
 Central Venous Catheter Insertion Performed by: Leopoldo Bruckner, MD, anesthesiologist Start/End11/07/2024 8:18 AM, 11/07/2024 8:28 AM Patient location: OR. Preanesthetic checklist: patient identified, IV checked, site marked, risks and benefits discussed, surgical consent, monitors and equipment checked, pre-op evaluation, timeout performed and anesthesia consent Position: supine Patient sedated Hand hygiene performed  and maximum sterile barriers used  Catheter size: 8.5 Fr Sheath introducer Procedure performed using ultrasound to evaluate access site. Ultrasound Notes:relevant anatomy identified, ultrasound used to visualize needle entry, vessel patent under ultrasound and image(s) printed for medical record. Attempts: 1 Following insertion, line sutured, dressing applied and Biopatch. Post procedure assessment: blood return through all ports, free fluid flow and no air  Patient tolerated the procedure well with no immediate complications.

## 2024-11-07 NOTE — Anesthesia Postprocedure Evaluation (Signed)
 Anesthesia Post Note  Patient: Frederick Miles  Procedure(s) Performed: CORONARY ARTERY BYPASS GRAFTING (CABG) TIMES ONE USING LEFT INTERNAL MAMMARY ARTERY (Chest) ECHOCARDIOGRAM, TRANSESOPHAGEAL, INTRAOPERATIVE     Patient location during evaluation: SICU Anesthesia Type: General Level of consciousness: sedated Pain management: pain level controlled Vital Signs Assessment: post-procedure vital signs reviewed and stable Respiratory status: patient remains intubated per anesthesia plan Cardiovascular status: stable Postop Assessment: no apparent nausea or vomiting Anesthetic complications: no   No notable events documented.  Last Vitals:  Vitals:   11/07/24 1200 11/07/24 1324  BP:  112/66  Pulse:  72  Resp:  18  Temp:    SpO2: 100% 100%    Last Pain:  Vitals:   11/07/24 0717  TempSrc: Oral  PainSc: 0-No pain                 Murat Rideout

## 2024-11-07 NOTE — Anesthesia Procedure Notes (Signed)
 Arterial Line Insertion Start/End11/07/2024 8:20 AM, 11/07/2024 8:20 AM Performed by: Arvell Edsel HERO, CRNA, CRNA  Patient location: Pre-op. Preanesthetic checklist: patient identified, IV checked, site marked, risks and benefits discussed, surgical consent, monitors and equipment checked, pre-op evaluation, timeout performed and anesthesia consent Lidocaine  1% used for infiltration Left, radial was placed Catheter size: 20 G Hand hygiene performed , maximum sterile barriers used  and Seldinger technique used Allen's test indicative of satisfactory collateral circulation Attempts: 1 Following insertion, dressing applied and Biopatch. Patient tolerated the procedure well with no immediate complications.

## 2024-11-08 ENCOUNTER — Inpatient Hospital Stay (HOSPITAL_COMMUNITY)

## 2024-11-08 ENCOUNTER — Other Ambulatory Visit: Payer: Self-pay | Admitting: Cardiovascular Disease

## 2024-11-08 ENCOUNTER — Encounter (HOSPITAL_COMMUNITY): Payer: Self-pay | Admitting: Internal Medicine

## 2024-11-08 LAB — BASIC METABOLIC PANEL WITH GFR
Anion gap: 16 — ABNORMAL HIGH (ref 5–15)
Anion gap: 14 (ref 5–15)
BUN: 14 mg/dL (ref 8–23)
BUN: 19 mg/dL (ref 8–23)
CO2: 19 mmol/L — ABNORMAL LOW (ref 22–32)
CO2: 21 mmol/L — ABNORMAL LOW (ref 22–32)
Calcium: 7.9 mg/dL — ABNORMAL LOW (ref 8.9–10.3)
Calcium: 8.4 mg/dL — ABNORMAL LOW (ref 8.9–10.3)
Chloride: 106 mmol/L (ref 98–111)
Chloride: 106 mmol/L (ref 98–111)
Creatinine, Ser: 1.36 mg/dL — ABNORMAL HIGH (ref 0.61–1.24)
Creatinine, Ser: 1.74 mg/dL — ABNORMAL HIGH (ref 0.61–1.24)
GFR, Estimated: 54 mL/min — ABNORMAL LOW (ref >60)
GFR, Estimated: 40 mL/min — ABNORMAL LOW (ref >60)
Glucose, Bld: 137 mg/dL — ABNORMAL HIGH (ref 70–99)
Glucose, Bld: 203 mg/dL — ABNORMAL HIGH (ref 70–99)
Potassium: 3.8 mmol/L (ref 3.5–5.1)
Potassium: 4.4 mmol/L (ref 3.5–5.1)
Sodium: 141 mmol/L (ref 135–145)
Sodium: 141 mmol/L (ref 135–145)

## 2024-11-08 LAB — CBC
HCT: 27.2 % — ABNORMAL LOW (ref 39.0–52.0)
HCT: 31.9 % — ABNORMAL LOW (ref 39.0–52.0)
Hemoglobin: 9.3 g/dL — ABNORMAL LOW (ref 13.0–17.0)
Hemoglobin: 10.7 g/dL — ABNORMAL LOW (ref 13.0–17.0)
MCH: 33 pg (ref 26.0–34.0)
MCH: 32.7 pg (ref 26.0–34.0)
MCHC: 34.2 g/dL (ref 30.0–36.0)
MCHC: 33.5 g/dL (ref 30.0–36.0)
MCV: 96.5 fL (ref 80.0–100.0)
MCV: 97.6 fL (ref 80.0–100.0)
Platelets: 134 K/uL — ABNORMAL LOW (ref 150–400)
Platelets: 158 K/uL (ref 150–400)
RBC: 2.82 MIL/uL — ABNORMAL LOW (ref 4.22–5.81)
RBC: 3.27 MIL/uL — ABNORMAL LOW (ref 4.22–5.81)
RDW: 12.4 % (ref 11.5–15.5)
RDW: 12.4 % (ref 11.5–15.5)
WBC: 9.8 K/uL (ref 4.0–10.5)
WBC: 12.4 K/uL — ABNORMAL HIGH (ref 4.0–10.5)
nRBC: 0 % (ref 0.0–0.2)
nRBC: 0 % (ref 0.0–0.2)

## 2024-11-08 LAB — GLUCOSE, CAPILLARY
Glucose-Capillary: 120 mg/dL — ABNORMAL HIGH (ref 70–99)
Glucose-Capillary: 132 mg/dL — ABNORMAL HIGH (ref 70–99)
Glucose-Capillary: 133 mg/dL — ABNORMAL HIGH (ref 70–99)
Glucose-Capillary: 134 mg/dL — ABNORMAL HIGH (ref 70–99)
Glucose-Capillary: 139 mg/dL — ABNORMAL HIGH (ref 70–99)
Glucose-Capillary: 140 mg/dL — ABNORMAL HIGH (ref 70–99)
Glucose-Capillary: 142 mg/dL — ABNORMAL HIGH (ref 70–99)
Glucose-Capillary: 162 mg/dL — ABNORMAL HIGH (ref 70–99)
Glucose-Capillary: 166 mg/dL — ABNORMAL HIGH (ref 70–99)
Glucose-Capillary: 170 mg/dL — ABNORMAL HIGH (ref 70–99)
Glucose-Capillary: 184 mg/dL — ABNORMAL HIGH (ref 70–99)

## 2024-11-08 LAB — MAGNESIUM
Magnesium: 2.8 mg/dL — ABNORMAL HIGH (ref 1.7–2.4)
Magnesium: 2.5 mg/dL — ABNORMAL HIGH (ref 1.7–2.4)

## 2024-11-08 MED ORDER — INSULIN GLARGINE-YFGN 100 UNIT/ML ~~LOC~~ SOLN
20.0000 [IU] | Freq: Every day | SUBCUTANEOUS | Status: DC
  Administered 2024-11-08 – 2024-11-11 (×4): 20 [IU] via SUBCUTANEOUS
  Filled 2024-11-08 (×5): qty 0.2

## 2024-11-08 MED ORDER — FENTANYL CITRATE (PF) 50 MCG/ML IJ SOSY: 25.0000 ug | PREFILLED_SYRINGE | Freq: Once | INTRAMUSCULAR | Status: DC

## 2024-11-08 MED ORDER — POTASSIUM CHLORIDE CRYS ER 20 MEQ PO TBCR
20.0000 meq | EXTENDED_RELEASE_TABLET | ORAL | Status: AC
  Administered 2024-11-08: 20 meq via ORAL
  Filled 2024-11-08 (×2): qty 1

## 2024-11-08 MED ORDER — LEVOTHYROXINE SODIUM 100 MCG PO TABS
100.0000 ug | ORAL_TABLET | Freq: Every day | ORAL | Status: DC
  Administered 2024-11-08 – 2024-11-12 (×5): 100 ug via ORAL
  Filled 2024-11-08 (×5): qty 1

## 2024-11-08 MED ORDER — HALOPERIDOL LACTATE 5 MG/ML IJ SOLN: 1.0000 mg | Freq: Four times a day (QID) | INTRAMUSCULAR | Status: DC | PRN

## 2024-11-08 MED ORDER — MELATONIN 3 MG PO TABS
3.0000 mg | ORAL_TABLET | Freq: Every day | ORAL | Status: DC
  Administered 2024-11-08 – 2024-11-11 (×4): 3 mg via ORAL
  Filled 2024-11-08 (×4): qty 1

## 2024-11-08 MED ORDER — ENOXAPARIN SODIUM 40 MG/0.4ML IJ SOSY
40.0000 mg | PREFILLED_SYRINGE | Freq: Every day | INTRAMUSCULAR | Status: AC
  Administered 2024-11-08 – 2024-11-09 (×2): 40 mg via SUBCUTANEOUS
  Filled 2024-11-08 (×2): qty 0.4

## 2024-11-08 MED ORDER — FUROSEMIDE 10 MG/ML IJ SOLN
20.0000 mg | Freq: Once | INTRAMUSCULAR | Status: AC
  Administered 2024-11-08: 20 mg via INTRAVENOUS
  Filled 2024-11-08: qty 2

## 2024-11-08 MED ORDER — INSULIN ASPART 100 UNIT/ML IJ SOLN: 4.0000 [IU] | Freq: Three times a day (TID) | INTRAMUSCULAR | Status: DC

## 2024-11-08 MED ORDER — INSULIN ASPART 100 UNIT/ML IJ SOLN: 0.0000 [IU] | INTRAMUSCULAR | Status: DC

## 2024-11-08 MED ORDER — INSULIN ASPART 100 UNIT/ML IJ SOLN
0.0000 [IU] | INTRAMUSCULAR | Status: DC
  Administered 2024-11-08: 4 [IU] via SUBCUTANEOUS
  Administered 2024-11-08: 2 [IU] via SUBCUTANEOUS
  Administered 2024-11-08: 7 [IU] via SUBCUTANEOUS
  Filled 2024-11-08: qty 2
  Filled 2024-11-08 (×2): qty 4

## 2024-11-08 NOTE — Progress Notes (Signed)
 1 Day Post-Op Procedure(s) (LRB): CORONARY ARTERY BYPASS GRAFTING (CABG) TIMES ONE USING LEFT INTERNAL MAMMARY ARTERY (N/A) ECHOCARDIOGRAM, TRANSESOPHAGEAL, INTRAOPERATIVE (N/A) Subjective: Confusion and agitation over night- pulled out central line and  A line C/o incisional pain this AM, oriented to person, place  Objective: Vital signs in last 24 hours: Temp:  [96.6 F (35.9 C)-99.7 F (37.6 C)] 99 F (37.2 C) (11/09 0700) Pulse Rate:  [56-86] 69 (11/09 0700) Cardiac Rhythm: Normal sinus rhythm (11/08 2000) Resp:  [7-27] 15 (11/09 0700) BP: (77-119)/(56-80) 113/64 (11/09 0700) SpO2:  [85 %-100 %] 97 % (11/09 0700) Arterial Line BP: (0-277)/(-26-251) 129/51 (11/09 0300) FiO2 (%):  [40 %-50 %] 40 % (11/08 1534) Weight:  [77.4 kg] 77.4 kg (11/09 0500)  Hemodynamic parameters for last 24 hours: CVP:  [0 mmHg-9 mmHg] 5 mmHg CO:  [3.3 L/min-6 L/min] 4.7 L/min CI:  [1.7 L/min/m2-3.1 L/min/m2] 2.4 L/min/m2  Intake/Output from previous day: 11/08 0701 - 11/09 0700 In: 6572.8 [P.O.:150; I.V.:3007.3; Blood:460; IV Piggyback:2955.4] Out: 2796 [Urine:1485; Blood:945; Chest Tube:366] Intake/Output this shift: No intake/output data recorded.  General appearance: alert, cooperative, and no distress Neurologic: non focal Heart: regular rate and rhythm Lungs: clear to auscultation bilaterally Abdomen: normal findings: soft, non-tender  Lab Results: Recent Labs    11/07/24 1827 11/08/24 0336  WBC 14.6* 9.8  HGB 10.1* 9.3*  HCT 29.4* 27.2*  PLT 142* 134*   BMET:  Recent Labs    11/07/24 1827 11/08/24 0336  NA 140 141  K 3.9 3.8  CL 104 106  CO2 18* 19*  GLUCOSE 106* 137*  BUN 14 14  CREATININE 1.27* 1.36*  CALCIUM 7.7* 7.9*    PT/INR:  Recent Labs    11/06/24 2017  LABPROT 15.4*  INR 1.2   ABG    Component Value Date/Time   PHART 7.402 11/07/2024 1729   HCO3 18.5 (L) 11/07/2024 1729   TCO2 19 (L) 11/07/2024 1729   ACIDBASEDEF 5.0 (H) 11/07/2024 1729    O2SAT 99 11/07/2024 1729   CBG (last 3)  Recent Labs    11/08/24 0508 11/08/24 0605 11/08/24 0705  GLUCAP 166* 162* 142*    Assessment/Plan: S/P Procedure(s) (LRB): CORONARY ARTERY BYPASS GRAFTING (CABG) TIMES ONE USING LEFT INTERNAL MAMMARY ARTERY (N/A) ECHOCARDIOGRAM, TRANSESOPHAGEAL, INTRAOPERATIVE (N/A) POD # 1 NEURO- delirium over night, has had some preop as well  Minimize pain meds, monitor CV- in SR with good hemodynamics  A line already out  Aspirin, statin, beta blocker  Resume lisinopril  when BP allows  Dc pacing wires  Resume Eliquis  prior to DC RESP- IS RENAL- creatinine 1.36 below baseline, monitor  Lasix this AM, supplement K ENDO- CBG mildly elevated  Transition from drip to Sem Glee + SSI  Restart synthroid GI- advance diet SCD + enoxaparin Dc chest tubes Cardiac rehab  LOS: 2 days    Frederick Miles 11/08/2024

## 2024-11-08 NOTE — Hospital Course (Signed)
 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  Has been having mild chest discomfort recently usually with activity.  Recently began having symptoms at rest.   On 11/6 he had an episode watching the news. He was brought to ED by EMS. Troponin was mildly elevated at 41>76. Cardiac catheterization the evening of 11/07 revealed severe, 99 %, stenosis of proximal LAD at edge of stent. He is currently chest pain free.  Dr. Kerrin reviewed the patient's diagnostic studies and determined he would benefit from surgical intervention. He reviewed the patient's treatment options as well as the risks and benefits of surgery. Mr. Walth was agreeable to proceed with surgery.  Hospital Course:  Mr. Schnoor was admitted to [facility] and remained stable. He was urgently brought to the operating room on 11/08. He underwent attempted off pump CABG x 1 but this was transitioned to on pump CABG x 1 utilizing LIMA to LAD. He tolerated the procedure well and was transferred to the SICU in stable condition. He was given K Centra for Eliquis  reversal following surgery. He was extubated the afternoon of surgery without complication. He developed delirium and agitation overnight and pulled out his arterial and central line. Low dose Lopressor was started. Epicardial pacing wires and chest tubes were removed on POD1 without complication. On POD 2, he was restarted on Apixaban  (for history of PAF) and he was felt stable for transfer to the floor for further convalescence.

## 2024-11-08 NOTE — Progress Notes (Signed)
   [address], [place] [address]             [phone]   Confusion persists, severe after oxycodone  Ambulated well  BP 121/69   Pulse 66   Temp 98.8 F (37.1 C) (Oral)   Resp 15   Ht 5' 10 (1.778 m)   Wt 77.4 kg   SpO2 94%   BMI 24.48 kg/m    Intake/Output Summary (Last 24 hours) at 11/08/2024 1824 Last data filed at 11/08/2024 1600 Gross per 24 hour  Intake 1731.34 ml  Output 800 ml  Net 931.34 ml    Hgb 10.7 Creatinine 1.74, K 4.4  Overall doing well Minimize narcotics  Jalasia Eskridge C. Kerrin, MD [facility] [phone]

## 2024-11-08 NOTE — Discharge Instructions (Signed)

## 2024-11-08 NOTE — Plan of Care (Signed)
  Problem: Activity: Goal: Ability to tolerate increased activity will improve Outcome: Progressing   Problem: Cardiac: Goal: Ability to achieve and maintain adequate cardiovascular perfusion will improve Outcome: Progressing   Problem: Fluid Volume: Goal: Ability to maintain a balanced intake and output will improve Outcome: Progressing

## 2024-11-09 ENCOUNTER — Encounter (HOSPITAL_COMMUNITY): Payer: Self-pay | Admitting: Thoracic Surgery (Cardiothoracic Vascular Surgery)

## 2024-11-09 ENCOUNTER — Inpatient Hospital Stay (HOSPITAL_COMMUNITY)

## 2024-11-09 LAB — CBC
HCT: 28.2 % — ABNORMAL LOW (ref 39.0–52.0)
Hemoglobin: 9.5 g/dL — ABNORMAL LOW (ref 13.0–17.0)
MCH: 32.8 pg (ref 26.0–34.0)
MCHC: 33.7 g/dL (ref 30.0–36.0)
MCV: 97.2 fL (ref 80.0–100.0)
Platelets: 133 K/uL — ABNORMAL LOW (ref 150–400)
RBC: 2.9 MIL/uL — ABNORMAL LOW (ref 4.22–5.81)
RDW: 12.6 % (ref 11.5–15.5)
WBC: 9.1 K/uL (ref 4.0–10.5)
nRBC: 0 % (ref 0.0–0.2)

## 2024-11-09 LAB — GLUCOSE, CAPILLARY
Glucose-Capillary: 70 mg/dL (ref 70–99)
Glucose-Capillary: 151 mg/dL — ABNORMAL HIGH (ref 70–99)
Glucose-Capillary: 170 mg/dL — ABNORMAL HIGH (ref 70–99)
Glucose-Capillary: 162 mg/dL — ABNORMAL HIGH (ref 70–99)

## 2024-11-09 LAB — RAPID HIV SCREEN (HIV 1/2 AB+AG)
HIV 1/2 Antibodies: NONREACTIVE
HIV-1 P24 Antigen - HIV24: NONREACTIVE

## 2024-11-09 LAB — BASIC METABOLIC PANEL WITH GFR
Anion gap: 10 (ref 5–15)
BUN: 22 mg/dL (ref 8–23)
CO2: 23 mmol/L (ref 22–32)
Calcium: 8.3 mg/dL — ABNORMAL LOW (ref 8.9–10.3)
Chloride: 109 mmol/L (ref 98–111)
Creatinine, Ser: 1.62 mg/dL — ABNORMAL HIGH (ref 0.61–1.24)
GFR, Estimated: 43 mL/min — ABNORMAL LOW (ref >60)
Glucose, Bld: 79 mg/dL (ref 70–99)
Potassium: 3.9 mmol/L (ref 3.5–5.1)
Sodium: 142 mmol/L (ref 135–145)

## 2024-11-09 LAB — HEPATITIS B SURFACE ANTIGEN: Hepatitis B Surface Ag: NONREACTIVE

## 2024-11-09 MED ORDER — MAGNESIUM HYDROXIDE 400 MG/5ML PO SUSP: 30.0000 mL | Freq: Every day | ORAL | Status: DC | PRN

## 2024-11-09 MED ORDER — INSULIN ASPART 100 UNIT/ML IJ SOLN
6.0000 [IU] | Freq: Three times a day (TID) | INTRAMUSCULAR | Status: DC
  Administered 2024-11-09 – 2024-11-11 (×6): 6 [IU] via SUBCUTANEOUS
  Filled 2024-11-09 (×6): qty 6

## 2024-11-09 MED ORDER — METOCLOPRAMIDE HCL 5 MG PO TABS
10.0000 mg | ORAL_TABLET | Freq: Three times a day (TID) | ORAL | Status: AC
  Administered 2024-11-09 – 2024-11-10 (×4): 10 mg via ORAL
  Filled 2024-11-09 (×4): qty 2

## 2024-11-09 MED ORDER — INSULIN ASPART 100 UNIT/ML IJ SOLN
0.0000 [IU] | Freq: Three times a day (TID) | INTRAMUSCULAR | Status: DC
  Administered 2024-11-09 (×2): 4 [IU] via SUBCUTANEOUS
  Administered 2024-11-10: 2 [IU] via SUBCUTANEOUS
  Administered 2024-11-10: 4 [IU] via SUBCUTANEOUS
  Administered 2024-11-11 (×3): 2 [IU] via SUBCUTANEOUS
  Administered 2024-11-11: 4 [IU] via SUBCUTANEOUS
  Filled 2024-11-09: qty 4
  Filled 2024-11-09 (×2): qty 2
  Filled 2024-11-09: qty 4
  Filled 2024-11-09: qty 2
  Filled 2024-11-09: qty 4
  Filled 2024-11-09: qty 2
  Filled 2024-11-09: qty 4

## 2024-11-09 MED ORDER — INSULIN ASPART 100 UNIT/ML IJ SOLN: 0.0000 [IU] | Freq: Every day | INTRAMUSCULAR | Status: DC

## 2024-11-09 MED ORDER — SODIUM CHLORIDE 0.9 % IV SOLN: 250.0000 mL | INTRAVENOUS | Status: AC | PRN

## 2024-11-09 MED ORDER — APIXABAN 5 MG PO TABS
5.0000 mg | ORAL_TABLET | Freq: Two times a day (BID) | ORAL | Status: DC
  Administered 2024-11-10 – 2024-11-12 (×5): 5 mg via ORAL
  Filled 2024-11-09 (×2): qty 1
  Filled 2024-11-09: qty 2
  Filled 2024-11-09 (×2): qty 1

## 2024-11-09 MED ORDER — LACTULOSE 10 GM/15ML PO SOLN
20.0000 g | Freq: Once | ORAL | Status: AC
  Administered 2024-11-09: 20 g via ORAL
  Filled 2024-11-09: qty 30

## 2024-11-09 MED ORDER — EMPAGLIFLOZIN 10 MG PO TABS
10.0000 mg | ORAL_TABLET | Freq: Every day | ORAL | Status: DC
  Administered 2024-11-09 – 2024-11-12 (×4): 10 mg via ORAL
  Filled 2024-11-09 (×4): qty 1

## 2024-11-09 MED ORDER — ASPIRIN 81 MG PO TBEC
81.0000 mg | DELAYED_RELEASE_TABLET | Freq: Every day | ORAL | Status: DC
  Administered 2024-11-09 – 2024-11-12 (×4): 81 mg via ORAL
  Filled 2024-11-09 (×4): qty 1

## 2024-11-09 MED ORDER — FENTANYL CITRATE (PF) 50 MCG/ML IJ SOSY: 25.0000 ug | PREFILLED_SYRINGE | Freq: Once | INTRAMUSCULAR | Status: DC

## 2024-11-09 MED ORDER — INSULIN ASPART 100 UNIT/ML IJ SOLN: 0.0000 [IU] | Freq: Three times a day (TID) | INTRAMUSCULAR | Status: DC

## 2024-11-09 MED ORDER — SODIUM CHLORIDE 0.9% FLUSH
3.0000 mL | Freq: Two times a day (BID) | INTRAVENOUS | Status: DC
  Administered 2024-11-09 – 2024-11-12 (×6): 3 mL via INTRAVENOUS

## 2024-11-09 MED ORDER — ALUM & MAG HYDROXIDE-SIMETH 200-200-20 MG/5ML PO SUSP: 15.0000 mL | Freq: Four times a day (QID) | ORAL | Status: DC | PRN

## 2024-11-09 MED ORDER — ~~LOC~~ CARDIAC SURGERY, PATIENT & FAMILY EDUCATION: Freq: Once | Status: AC

## 2024-11-09 MED ORDER — ORAL CARE MOUTH RINSE: 15.0000 mL | OROMUCOSAL | Status: DC | PRN

## 2024-11-09 MED ORDER — SODIUM CHLORIDE 0.9% FLUSH: 3.0000 mL | INTRAVENOUS | Status: DC | PRN

## 2024-11-09 NOTE — Progress Notes (Signed)
 Mobility Specialist Progress Note;   11/09/24 1240  Mobility  Activity Pivoted/transferred from chair to bed  Level of Assistance Minimal assist, patient does 75% or more  Assistive Device Front wheel walker  Distance Ambulated (ft) 5 ft  RUE Weight Bearing Per Provider Order NWB  LUE Weight Bearing Per Provider Order NWB  Activity Response Tolerated well  Mobility Referral Yes  Mobility visit 1 Mobility  Mobility Specialist Start Time (ACUTE ONLY) 1240  Mobility Specialist Stop Time (ACUTE ONLY) 1249  Mobility Specialist Time Calculation (min) (ACUTE ONLY) 9 min   Pt requesting assistance back to bed. Required MinA +2 to safely stand and transfer back to bed. Pt needing frequent reminders to maintain sternal precautions when mobilizing. VSS throughout. Pt left in bed with all needs met, alarm on. RN and wife present.   Lauraine Erm Mobility Specialist Please contact via SecureChat or [employer] [phone]

## 2024-11-09 NOTE — Op Note (Signed)
 Frederick Miles, PULLIN MEDICAL RECORD NO: [id number] ACCOUNT NO: [id number] DATE OF BIRTH: May 20, 1946 FACILITY: [facility] LOCATION: [facility] PHYSICIAN: Elspeth BROCKS. Kerrin, MD  Operative Report   DATE OF PROCEDURE: 11/07/2024  PREOPERATIVE DIAGNOSIS:  Severe single-vessel coronary artery disease with progressive angina.  POSTOPERATIVE DIAGNOSIS: Severe single-vessel coronary artery disease with progressive angina.  PROCEDURE: Median sternotomy, extracorporeal circulation, Coronary artery bypass grafting x1  Left internal mammary artery to LAD.  SURGEON: Elspeth BROCKS. Kerrin, MD  ASSISTANT: Con Bend, PA.  Experienced assistance was necessary for this case due to surgical complexity. Con Bend assisted with exposure, retraction of delicate tissues, suctioning, and suture management during the anastomosis.  ANESTHESIA: General.  FINDINGS: LAD, intramyocardial necessitating on-pump bypass. Mammary and LAD, good quality vessels.  Preserved left ventricular function with no significant valvular pathology by echocardiogram.  CLINICAL NOTE: Frederick Miles is a 78 year old man with known coronary artery disease including a previous LAD stent. He presented with chest discomfort, which had progressed from exertional to rest symptoms. Troponin was mildly elevated and at catheterization was found to have a severe 99% stenosis of the proximal LAD at the edge of his previous stent. This stenosis was felt to be better managed surgically rather than percutaneously and he was referred for coronary artery bypass grafting. The indications, risks, benefits, and alternatives were discussed in detail with the patient. He understood the plan to attempt off-pump grafting but possible need to go on pump if technical considerations warranted it.  OPERATIVE NOTE: [name] was brought to the operating room on 11/09/2024. He had induction of general anesthesia and was intubated. Dr. Lonni Custard of the  anesthesiology service performed transesophageal echocardiography. Please see his  separately dictated note for full details of that procedure, but it did show preserved left ventricular function with no significant valvular pathology. Intravenous antibiotics were administered. A Foley catheter was placed. The chest, abdomen, and legs were prepped and draped in the usual sterile fashion.  A median sternotomy was performed. There was more bleeding than normal with the sternotomy and throughout the first portion of the procedure. The left internal mammary artery was harvested using standard technique under direct vision. The branches were  doubly clipped and divided. Smaller branches were clipped on the mammary side and cauterized on the chest wall. The patient was given the calculated dose of heparin for off-pump bypass grafting prior to dividing the distal end of the mammary artery. There was excellent flow through the cut end of the vessel.  Sternal retractor was placed and was gradually opened over time. The pericardium was opened. The ascending aorta was inspected. It was enlarged and measured about 4.3 cm. The mammary was brought through an incision in the pericardium and the distal end was beveled. The Maquet system was used to stabilize the heart. The apical suction was applied and the heart was retracted to the right side exposing the LAD. The stabilizing bar then was placed at the site selected for possible LAD grafting; however, as the dissection proceeded, the anterior vein was visible, but the LAD itself was not.  It turned out to be intramyocardial. The heart was allowed to rest back in its normal position in the pericardium. Additional heparin was administered with a plan to proceed with on-pump grafting due to the intramyocardial LAD. After confirming adequate anticoagulation with ACT measurement, the aorta was cannulated via concentric 2-0 Ethibond pledgeted pursestring sutures. A dual-stage  venous cannula was placed via a pursestring suture in the right atrial appendage. Cardiopulmonary  bypass was initiated. The patient was maintained at normothermia. An antegrade cardioplegia cannula was placed in the ascending aorta via a 4-0 Prolene pledgeted suture. The aorta was cross-clamped. The left ventricle was emptied via the aortic root vent. Cardiac arrest then was achieved with a combination of cold antegrade blood cardioplegia and topical ice saline. 1.5 L of cardioplegia was administered. There was a diastolic arrest and there was septal cooling to 14 degrees Celsius.  The left internal mammary artery then was anastomosed end-to-side to the LAD. There were both 1.5 mm good quality vessels at the site of the anastomosis, which was performed with a running 8-0 Prolene suture. At the completion of the anastomosis, the bulldog clamps were removed from the mammary artery and rapid septal rewarming was noted. The mammary pedicle was tacked to the epicardial surface of the heart with 6-0 Prolene sutures. The aortic cross-clamp was removed. The total cross-clamp time was 20 minutes.  Epicardial pacing wires were placed on the right ventricle. The patient was in sinus rhythm, so atrial pacing wires were not placed. The patient was weaned from cardiopulmonary bypass on the first attempt. Total bypass time was 37 minutes. The patient remained hemodynamically stable throughout the post-bypass period.  A test dose of Protamine was administered and was well tolerated. The atrial and aortic cannulae were removed. The remainder of the protamine was administered without incident. The chest was irrigated with warm saline. Hemostasis was achieved. Left pleural and mediastinal chest tubes were placed through the separate subcostal incisions. The pericardium was not reapproximated. The sternum was closed with a combination of single and double heavy gauge stainless steel wires. The pectoralis fascia, subcutaneous  tissue, and skin were closed in standard fashion. All sponge, needle, and instrument counts were correct at the end of the procedure.   PUS D: 11/09/2024 5:21:45 pm T: 11/09/2024 6:31:00 pm  JOB: [id number]/ [id number]

## 2024-11-09 NOTE — Plan of Care (Signed)
  Problem: Activity: Goal: Ability to tolerate increased activity will improve Outcome: Progressing   Problem: Cardiac: Goal: Ability to achieve and maintain adequate cardiovascular perfusion will improve Outcome: Progressing   Problem: Health Behavior/Discharge Planning: Goal: Ability to safely manage health-related needs after discharge will improve Outcome: Progressing   Problem: Coping: Goal: Ability to adjust to condition or change in health will improve Outcome: Progressing

## 2024-11-09 NOTE — Progress Notes (Signed)
 2 Days Post-Op Procedure(s) (LRB): CORONARY ARTERY BYPASS GRAFTING (CABG) TIMES ONE USING LEFT INTERNAL MAMMARY ARTERY (N/A) ECHOCARDIOGRAM, TRANSESOPHAGEAL, INTRAOPERATIVE (N/A) Subjective: Feels much better this Am, still some nausea  Objective: Vital signs in last 24 hours: Temp:  [98.8 F (37.1 C)-99.5 F (37.5 C)] 99.2 F (37.3 C) (11/09 2030) Pulse Rate:  [61-79] 63 (11/10 0700) Cardiac Rhythm: Normal sinus rhythm (11/09 2000) Resp:  [11-31] 12 (11/10 0700) BP: (86-131)/(56-83) 105/77 (11/10 0700) SpO2:  [92 %-100 %] 98 % (11/10 0700) Weight:  [73 kg] 73 kg (11/10 0500)  Hemodynamic parameters for last 24 hours:    Intake/Output from previous day: 11/09 0701 - 11/10 0700 In: 267.8 [I.V.:5.4; IV Piggyback:262.4] Out: 250 [Urine:210; Chest Tube:40] Intake/Output this shift: No intake/output data recorded.  General appearance: alert, cooperative, and no distress Neurologic: intact Heart: regular rate and rhythm Lungs: clear to auscultation bilaterally  Lab Results: Recent Labs    11/08/24 1623 11/09/24 0232  WBC 12.4* 9.1  HGB 10.7* 9.5*  HCT 31.9* 28.2*  PLT 158 133*   BMET:  Recent Labs    11/08/24 1623 11/09/24 0232  NA 141 142  K 4.4 3.9  CL 106 109  CO2 21* 23  GLUCOSE 203* 79  BUN 19 22  CREATININE 1.74* 1.62*  CALCIUM 8.4* 8.3*    PT/INR:  Recent Labs    11/06/24 2017  LABPROT 15.4*  INR 1.2   ABG    Component Value Date/Time   PHART 7.402 11/07/2024 1729   HCO3 18.5 (L) 11/07/2024 1729   TCO2 19 (L) 11/07/2024 1729   ACIDBASEDEF 5.0 (H) 11/07/2024 1729   O2SAT 99 11/07/2024 1729   CBG (last 3)  Recent Labs    11/08/24 2007 11/08/24 2331 11/09/24 0401  GLUCAP 170* 120* 70    Assessment/Plan: S/P Procedure(s) (LRB): CORONARY ARTERY BYPASS GRAFTING (CABG) TIMES ONE USING LEFT INTERNAL MAMMARY ARTERY (N/A) ECHOCARDIOGRAM, TRANSESOPHAGEAL, INTRAOPERATIVE (N/A) Plan for transfer to step-down: see transfer orders NEURO-  confusion improved, did sleep last night CV- in SR, BP well controlled  ASA, Statin, beta blocker  Maintaining SR- resume Eliquis  tomorrow RESP- continue IS RENAL- CKD, baseline 1.5, creatinine today 1.62 down from 1.74  At baseline weight ENDO- CBG elevated yesterday evening  Adjust DM meds Gi- still some nausea  PO Reglan, lactulose x 1 Anemia secondary to ABL Hgb 9.5 not clinically significant Cardiac rehab   LOS: 3 days    Elspeth JAYSON Millers 11/09/2024

## 2024-11-09 NOTE — Evaluation (Signed)
 Physical Therapy Evaluation Patient Details Name: Frederick Miles MRN: [id number] DOB: [birth year] Today's Date: 11/09/2024  History of Present Illness  78 y.o. male admitted 11/6 for NSTEMI. 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 PMH: essential hypertension, hypothyroidism, chronic kidney stage IIIa, atrial fibrillation on Eliquis , coronary artery disease status post stent, bladder cancer, emphysema.  Clinical Impression  Patient is s/p above surgery presenting with functional limitations due to the deficits listed below (see PT Problem List). Previously independent and active, likes to fish. Reviewed sternal precautions and provided handout - intermittent cues to maintain throughout session. Required a light min assist to rise to EOB, and ambulates at a supervision level with and without RW for support. DGI indicates low falls risk with 20/24. Does demonstrate some mild cognitive impairment (memory, awareness mostly.) Would benefit from OT consult. Wife can assist at d/c for the first week or so. Will update recs as appropriate but anticipate continued rapid improvement. Patient will benefit from acute skilled PT to increase their independence and safety with mobility to facilitate discharge.         If plan is discharge home, recommend the following: A little help with walking and/or transfers;A little help with bathing/dressing/bathroom;Assistance with cooking/housework;Direct supervision/assist for financial management;Direct supervision/assist for medications management;Assist for transportation;Supervision due to cognitive status;Help with stairs or ramp for entrance   Can travel by private vehicle        Equipment Recommendations None recommended by PT (Anticipated)  Recommendations for Other Services       Functional Status Assessment Patient has had a recent decline in their functional status and demonstrates the ability to make significant improvements in function in a  reasonable and predictable amount of time.     Precautions / Restrictions Precautions Precautions: Fall;Sternal Precaution Booklet Issued: Yes (comment) Recall of Precautions/Restrictions: Impaired Restrictions Weight Bearing Restrictions Per Provider Order: Yes RUE Weight Bearing Per Provider Order: Non weight bearing LUE Weight Bearing Per Provider Order: Non weight bearing Other Position/Activity Restrictions: Sternal precautions      Mobility  Bed Mobility Overal bed mobility: Needs Assistance Bed Mobility: Rolling, Sidelying to Sit, Sit to Supine Rolling: Contact guard assist Sidelying to sit: Min assist, HOB elevated   Sit to supine: Contact guard assist   General bed mobility comments: Educated on techniques to mobilize in and out of bed safely with sternal precautions. Modified log roll, holding pillow to chest. Able to move LEs well in/out of bed, min assist to raise trunk.    Transfers Overall transfer level: Needs assistance Equipment used: Rolling walker (2 wheels) Transfers: Sit to/from Stand Sit to Stand: Supervision           General transfer comment: stable upon rising, no assist, cues for hand placement with each transition from bed. RW to steady with light UE support.    Ambulation/Gait Ambulation/Gait assistance: Supervision Gait Distance (Feet): 250 Feet Assistive device: Rolling walker (2 wheels), None Gait Pattern/deviations: Step-through pattern, Decreased stride length, Drifts right/left Gait velocity: dec Gait velocity interpretation: >2.62 ft/sec, indicative of community ambulatory   General Gait Details: Completed first half of distance with RW, great pace, cues for proximity to device to avoid leaning over RW. Second half of distance without AD, very mild drift from straight path at times but no overt LOB and able to self correct. HR in 70s throughout, no dyspnea. Educated on safey and awareness, tolerated dynamic challenges. Completed DGI  as well, no physical assist.  Stairs  Wheelchair Mobility     Tilt Bed    Modified Rankin (Stroke Patients Only)       Balance Overall balance assessment: Modified Independent                               Standardized Balance Assessment Standardized Balance Assessment : Dynamic Gait Index   Dynamic Gait Index Level Surface: Normal Change in Gait Speed: Normal Gait with Horizontal Head Turns: Mild Impairment Gait with Vertical Head Turns: Normal Gait and Pivot Turn: Normal Step Over Obstacle: Mild Impairment Step Around Obstacles: Normal Steps: Moderate Impairment Total Score: 20       Pertinent Vitals/Pain Pain Assessment Pain Assessment: 0-10 Pain Score: 1  Pain Location: chest Pain Descriptors / Indicators: Operative site guarding Pain Intervention(s): Limited activity within patient's tolerance, Monitored during session, Repositioned    Home Living Family/patient expects to be discharged to:: Private residence Living Arrangements: Spouse/significant other Available Help at Discharge: Family;Available 24 hours/day (First week or so available) Type of Home: House Home Access: Level entry     Alternate Level Stairs-Number of Steps: 12 Home Layout: Two level;Bed/bath upstairs Home Equipment: None      Prior Function Prior Level of Function : Independent/Modified Independent;Driving             Mobility Comments: ind, likes to fishing  hobby, retired from [employer]. Denies falling ADLs Comments: Ind, likes to fish for bass     Extremity/Trunk Assessment   Upper Extremity Assessment Upper Extremity Assessment: Defer to OT evaluation    Lower Extremity Assessment Lower Extremity Assessment: Generalized weakness       Communication   Communication Communication: Impaired Factors Affecting Communication: Hearing impaired    Cognition Arousal: Alert Behavior During Therapy: WFL for tasks  assessed/performed   PT - Cognitive impairments: Awareness, Problem solving                         Following commands: Impaired Following commands impaired: Only follows one step commands consistently     Cueing Cueing Techniques: Verbal cues, Gestural cues     General Comments General comments (skin integrity, edema, etc.): Reviewed precautions, educated on safety, and progression with care plan during acute admission.    Exercises     Assessment/Plan    PT Assessment Patient needs continued PT services  PT Problem List Decreased strength;Decreased activity tolerance;Decreased range of motion;Decreased balance;Decreased mobility;Decreased cognition;Decreased knowledge of use of DME;Decreased safety awareness;Decreased knowledge of precautions;Cardiopulmonary status limiting activity;Pain       PT Treatment Interventions DME instruction;Gait training;Stair training;Functional mobility training;Therapeutic activities;Therapeutic exercise;Balance training;Neuromuscular re-education;Cognitive remediation;Patient/family education;Modalities    PT Goals (Current goals can be found in the Care Plan section)  Acute Rehab PT Goals Patient Stated Goal: Go home PT Goal Formulation: With patient Time For Goal Achievement: 11/23/24 Potential to Achieve Goals: Good    Frequency Min 2X/week     Co-evaluation               AM-PAC PT 6 Clicks Mobility  Outcome Measure Help needed turning from your back to your side while in a flat bed without using bedrails?: A Little Help needed moving from lying on your back to sitting on the side of a flat bed without using bedrails?: A Little Help needed moving to and from a bed to a chair (including a wheelchair)?: A Little Help needed standing up from  a chair using your arms (e.g., wheelchair or bedside chair)?: A Little Help needed to walk in hospital room?: A Little Help needed climbing 3-5 steps with a railing? : A Little 6  Click Score: 18    End of Session   Activity Tolerance: Patient tolerated treatment well Patient left: in bed;with call bell/phone within reach;with bed alarm set Nurse Communication: Mobility status PT Visit Diagnosis: Other abnormalities of gait and mobility (R26.89);Muscle weakness (generalized) (M62.81);Pain;Other symptoms and signs involving the nervous system (R29.898) Pain - part of body:  (lower sternum)    Time: 8464-8390 PT Time Calculation (min) (ACUTE ONLY): 34 min   Charges:   PT Evaluation $PT Eval Low Complexity: 1 Low PT Treatments $Gait Training: 8-22 mins PT General Charges $$ ACUTE PT VISIT: 1 Visit         Leontine Roads, PT, DPT [facility] Physical Therapist Office: [phone] Website: [email or web address]   Leontine GORMAN Roads 11/09/2024, 4:39 PM

## 2024-11-09 NOTE — Progress Notes (Signed)
 Patient arrived to 4E, oriented to unit, and CCMD notified. BP 114/68 (BP Location: Left Arm)   Pulse 64   Temp 98.9 F (37.2 C) (Oral)   Resp 19   Ht 5' 10 (1.778 m)   Wt 73 kg   SpO2 98%   BMI 23.09 kg/m

## 2024-11-09 NOTE — Discharge Summary (Signed)
 [address] [place] [address]             [phone]        Physician Discharge Summary  Patient ID: Frederick Miles MRN: [id number] DOB/AGE: [birth year] 77 y.o.  Admit date: 11/05/2024 Discharge date: 11/12/2024  Admission Diagnoses:  Patient Active Problem List   Diagnosis Date Noted   AKI (acute kidney injury) 11/06/2024   Malignant neoplasm of urinary bladder (HCC) 11/05/2024   Pulmonary emphysema (HCC) 11/05/2024   Stage 3a chronic kidney disease (HCC) 11/05/2024   NSTEMI (non-ST elevated myocardial infarction) (HCC) 11/05/2024   Peripheral vascular disease 06/27/2018   Diabetes mellitus (HCC) 06/27/2018   AAA (abdominal aortic aneurysm) without rupture 04/28/2017   Paroxysmal atrial fibrillation (HCC) 02/12/2016   Essential hypertension 12/21/2010   MURMUR 12/21/2010   Coronary artery disease involving native coronary artery of native heart with unstable angina pectoris (HCC) 12/21/2009   Hyperlipidemia LDL goal <70 04/07/2009   NECK PAIN 12/19/2007   Hypothyroidism 08/01/2007   GERD 08/01/2007     Discharge Diagnoses:  Patient Active Problem List   Diagnosis Date Noted   S/P CABG x 1 11/12/2024   AKI (acute kidney injury) 11/06/2024   Malignant neoplasm of urinary bladder (HCC) 11/05/2024   Pulmonary emphysema (HCC) 11/05/2024   Stage 3a chronic kidney disease (HCC) 11/05/2024   NSTEMI (non-ST elevated myocardial infarction) (HCC) 11/05/2024   Peripheral vascular disease 06/27/2018   Diabetes mellitus (HCC) 06/27/2018   AAA (abdominal aortic aneurysm) without rupture 04/28/2017   Paroxysmal atrial fibrillation (HCC) 02/12/2016   Essential hypertension 12/21/2010   MURMUR 12/21/2010   Coronary artery disease involving native coronary artery of native heart with unstable angina pectoris (HCC) 12/21/2009   Hyperlipidemia LDL goal <70 04/07/2009   NECK PAIN 12/19/2007   Hypothyroidism 08/01/2007   GERD 08/01/2007      Discharged Condition: stable  HPI:  Frederick Miles is a 78 yo man with history of CAD, MI, stent, 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  Has been having mild chest discomfort recently usually with activity.  Recently began having symptoms at rest.   On 11/6 he had an episode watching the news. He was brought to ED by EMS. Troponin was mildly elevated at 41>76. Cardiac catheterization the evening of 11/07 revealed severe, 99 %, stenosis of proximal LAD at edge of stent. He is currently chest pain free.  Dr. Kerrin reviewed the patient's diagnostic studies and determined he would benefit from surgical intervention. He reviewed the patient's treatment options as well as the risks and benefits of surgery. Frederick Miles was agreeable to proceed with surgery.  Hospital Course:  Frederick Miles was admitted to [facility] and remained stable. He was urgently brought to the operating room on 11/08. He underwent attempted off pump CABG x 1 but this was transitioned to on pump CABG x 1 utilizing LIMA to LAD. He tolerated the procedure well and was transferred to the SICU in stable condition. He was given K Centra for Eliquis  reversal following surgery. He was extubated the afternoon of surgery without complication. He developed delirium and agitation overnight and pulled out his arterial and central line. Low dose Lopressor was started. Epicardial pacing wires and chest tubes were removed on POD1 without complication. On POD 2, he was restarted on Apixaban  (for history  of PAF) and he was felt stable for transfer to the floor for further convalescence. He developed atrial fibrillation with RVR and was started on IV Amiodarone but quickly converted to NSR and was transitioned to PO Amiodarone due to hypotension and bradycardia. He remained  bradycardic, Lopressor was held and Amiodarone was decreased to 200mg  BID. As discussed with Dr. Kerrin he was not started on Plavix for NSTEMI since he was on Eliquis  and ASA. He was ambulating well on room air. His bowels began moving appropriately 11/12. His incisions were healing well without sign of infection. He was felt stable for discharge home.   Consults: None  Significant Diagnostic Studies:  LEFT HEART CATH AND CORONARY ANGIOGRAPHY     Ost LAD to Prox LAD lesion is 99% stenosed.   1.  High-grade edge ISR proximal LAD stent.  Given previous stent failure and proximity to left main, will obtain cardiothoracic surgical consultation. 2.  LVEDP of 28 mmHg.  ECHOCARDIOGRAM REPORT       Patient Name:   Frederick Miles Date of Exam: 11/06/2024  Medical Rec #:  [id number]        Height:       70.0 in  Accession #:    [id number]       Weight:       165.0 lb  Date of Birth:  [birth year]        BSA:          1.923 m  Patient Age:    78 years         BP:           123/71 mmHg  Patient Gender: M                HR:           52 bpm.  Exam Location:  Inpatient   Procedure: 2D Echo (Both Spectral and Color Flow Doppler were utilized  during            procedure).   Indications:    Elevated Troponin    History:        Patient has prior history of Echocardiogram examinations.                  Signs/Symptoms:Elevated Troponin.    Sonographer:    Norleen Amour  Referring Phys: WADDELL A PARCELLS   IMPRESSIONS     1. Left ventricular ejection fraction, by estimation, is 60 to 65%. The  left ventricle has normal function. The left ventricle has no regional  wall motion abnormalities. Left ventricular diastolic parameters were  normal.   2. Right ventricular systolic function is normal. The right ventricular  size is normal.   3. The mitral valve is normal in structure. No evidence of mitral valve  regurgitation. No evidence of mitral stenosis.   4. The aortic valve is tricuspid.  Aortic valve regurgitation is not  visualized. Aortic valve sclerosis is present, with no evidence of aortic  valve stenosis.   5. The inferior vena cava is normal in size with greater than 50%  respiratory variability, suggesting right atrial pressure of 3 mmHg.   FINDINGS   Left Ventricle: Left ventricular ejection fraction, by estimation, is 60  to 65%. The left ventricle has normal function. The left ventricle has no  regional wall motion abnormalities. The left ventricular internal cavity  size was normal in size. There is   no left ventricular hypertrophy. Left ventricular diastolic parameters  were  normal.   Right Ventricle: The right ventricular size is normal. No increase in  right ventricular wall thickness. Right ventricular systolic function is  normal.   Left Atrium: Left atrial size was normal in size.   Right Atrium: Right atrial size was normal in size.   Pericardium: There is no evidence of pericardial effusion.   Mitral Valve: The mitral valve is normal in structure. No evidence of  mitral valve regurgitation. No evidence of mitral valve stenosis. MV peak  gradient, 4.0 mmHg. The mean mitral valve gradient is 1.0 mmHg.   Tricuspid Valve: The tricuspid valve is normal in structure. Tricuspid  valve regurgitation is not demonstrated. No evidence of tricuspid  stenosis.   Aortic Valve: The aortic valve is tricuspid. Aortic valve regurgitation is  not visualized. Aortic valve sclerosis is present, with no evidence of  aortic valve stenosis. Aortic valve mean gradient measures 3.0 mmHg.  Aortic valve peak gradient measures 5.3   mmHg. Aortic valve area, by VTI measures 2.97 cm.   Pulmonic Valve: The pulmonic valve was normal in structure. Pulmonic valve  regurgitation is not visualized. No evidence of pulmonic stenosis.   Aorta: The aortic root and ascending aorta are structurally normal, with  no evidence of dilitation.   Venous: The inferior vena cava is  normal in size with greater than 50%  respiratory variability, suggesting right atrial pressure of 3 mmHg.   IAS/Shunts: The atrial septum is grossly normal.     LEFT VENTRICLE  PLAX 2D  LVIDd:         4.90 cm     Diastology  LVIDs:         2.60 cm     LV e' medial:    8.81 cm/s  LV PW:         0.80 cm     LV E/e' medial:  9.5  LV IVS:        1.00 cm     LV e' lateral:   12.60 cm/s  LVOT diam:     2.10 cm     LV E/e' lateral: 6.7  LV SV:         89  LV SV Index:   46  LVOT Area:     3.46 cm  LV IVRT:       102 msec    LV Volumes (MOD)  LV vol d, MOD A2C: 51.4 ml  LV vol d, MOD A4C: 58.7 ml  LV vol s, MOD A2C: 12.5 ml  LV vol s, MOD A4C: 11.5 ml  LV SV MOD A2C:     38.9 ml  LV SV MOD A4C:     58.7 ml  LV SV MOD BP:      42.9 ml   RIGHT VENTRICLE             IVC  RV Basal diam:  2.70 cm     IVC diam: 1.60 cm  RV S prime:     16.00 cm/s  TAPSE (M-mode): 1.9 cm      PULMONARY VEINS                              Diastolic Velocity: 29.20 cm/s                              S/D Velocity:       1.70  Systolic Velocity:  49.70 cm/s   LEFT ATRIUM           Index        RIGHT ATRIUM           Index  LA diam:      3.40 cm 1.77 cm/m   RA Area:     10.60 cm  LA Vol (A2C): 15.9 ml 8.27 ml/m   RA Volume:   21.20 ml  11.02 ml/m  LA Vol (A4C): 22.5 ml 11.70 ml/m   AORTIC VALVE                    PULMONIC VALVE  AV Area (Vmax):    3.16 cm     PV Vmax:       0.89 m/s  AV Area (Vmean):   2.78 cm     PV Peak grad:  3.2 mmHg  AV Area (VTI):     2.97 cm  AV Vmax:           115.00 cm/s  AV Vmean:          86.300 cm/s  AV VTI:            0.299 m  AV Peak Grad:      5.3 mmHg  AV Mean Grad:      3.0 mmHg  LVOT Vmax:         105.00 cm/s  LVOT Vmean:        69.300 cm/s  LVOT VTI:          0.256 m  LVOT/AV VTI ratio: 0.86    AORTA  Ao Root diam: 3.20 cm  Ao Asc diam:  3.60 cm   MITRAL VALVE  MV Area (PHT): 2.46 cm     SHUNTS  MV Area VTI:   2.63 cm      Systemic VTI:  0.26 m  MV Peak grad:  4.0 mmHg     Systemic Diam: 2.10 cm  MV Mean grad:  1.0 mmHg  MV Vmax:       1.00 m/s  MV Vmean:      47.5 cm/s  MV Decel Time: 309 msec  MV E velocity: 83.80 cm/s  MV A velocity: 104.00 cm/s  MV E/A ratio:  0.81   Sunit Tolia  Electronically signed by Madonna Large  Signature Date/Time: 11/06/2024/3:57:46 PM        Final     Treatments: surgery:  Operative Report    DATE OF PROCEDURE: 11/07/2024   PREOPERATIVE DIAGNOSIS:  Severe single-vessel coronary artery disease with progressive angina.   POSTOPERATIVE DIAGNOSIS: Severe single-vessel coronary artery disease with progressive angina.   PROCEDURE: Median sternotomy, extracorporeal circulation, coronary artery bypass grafting x1 (left internal mammary artery to LAD).   SURGEON: Elspeth BROCKS. Kerrin, MD   Discharge Exam: Blood pressure 118/74, pulse (!) 46, temperature (!) 97.1 F (36.2 C), temperature source Oral, resp. rate 18, height 5' 10 (1.778 m), weight 74.1 kg, SpO2 99%. General appearance: alert, cooperative, and no distress Neurologic: intact and A&Ox4 Heart: sinus bradycardia, no murmur Lungs: clear to auscultation bilaterally Abdomen: soft, non-tender; bowel sounds normal; no masses,  no organomegaly Extremities: extremities normal, atraumatic, no cyanosis or edema Wound: Clean and dry without sign of infection  Discharge Medications:  The patient has been discharged on:   1.Beta Blocker:  Yes [   ]  No   [  X ]                              If No, reason: Bradycardia  2.Ace Inhibitor/ARB: Yes [   ]                                     No  [  X  ]                                     If No, reason: Hypotension  3.Statin:   Yes [ X  ]                  No  [   ]                  If No, reason:  4.Ecasa:  Yes  [  X ]                  No   [   ]                  If No, reason:  Patient had ACS upon admission: Yes  Plavix/P2Y12  inhibitor: Yes [   ]                                      No  [  X ] Eliquis  restarted for PAF     Discharge Instructions     Amb Referral to Cardiac Rehabilitation   Complete by: As directed    Diagnosis: CABG   CABG X ___: 1   After initial evaluation and assessments completed: Virtual Based Care may be provided alone or in conjunction with Phase 2 Cardiac Rehab based on patient barriers.: Yes   Intensive Cardiac Rehabilitation (ICR) [facility] location only OR Traditional Cardiac Rehabilitation (TCR) *If criteria for ICR are not met will enroll in TCR ([facility] only): Yes      Allergies as of 11/12/2024       Reactions   Quinolones Other (See Comments)    Unknown        Medication List     STOP taking these medications    diltiazem  120 MG 24 hr capsule Commonly known as: CARDIZEM  CD   hydrochlorothiazide  12.5 MG capsule Commonly known as: MICROZIDE    lisinopril  20 MG tablet Commonly known as: ZESTRIL    nitroGLYCERIN  0.4 MG SL tablet Commonly known as: NITROSTAT        TAKE these medications    acetaminophen  325 MG tablet Commonly known as: Tylenol  Take 2 tablets (650 mg total) by mouth every 6 (six) hours as needed.   amiodarone 200 MG tablet Commonly known as: PACERONE Take 1 tablet twice per day for 7 days, then take 1 tablet daily thereafter   apixaban  5 MG Tabs tablet Commonly known as: Eliquis  Take 1 tablet (5 mg total) by mouth 2 (two) times daily. What changed: See the new instructions.   atorvastatin 40 MG tablet Commonly known as: LIPITOR Take 40 mg by mouth daily.   empagliflozin 10 MG Tabs tablet Commonly known as: JARDIANCE Take 10 mg by mouth daily.   FreeStyle [employer]  2 Sensor Misc CHANGE EVERY 14 DAYS TO MONITOR BLOOD GLUCOSE CONTINUOUSLY   insulin  lispro 100 UNIT/ML injection Commonly known as: HUMALOG Inject 6-14 Units into the skin 3 (three) times daily with meals.   Lantus SoloStar 100 UNIT/ML Solostar Pen Generic drug: insulin   glargine Inject 25 Units into the skin at bedtime.   levothyroxine 100 MCG tablet Commonly known as: SYNTHROID Take 100 mcg by mouth daily.   Magnesium  500 MG Caps Take 500 mg by mouth daily.   metFORMIN 500 MG tablet Commonly known as: GLUCOPHAGE Take 500 mg by mouth daily.   omeprazole 20 MG capsule Commonly known as: PRILOSEC Take 20 mg by mouth daily.   QC TUMERIC COMPLEX PO Take 1 capsule by mouth daily.   traMADol 50 MG tablet Commonly known as: ULTRAM Take 1 tablet (50 mg total) by mouth every 6 (six) hours as needed for severe pain (pain score 7-10).        Follow-up Information     Rutha Manuelita HERO, PA-C Follow up on 11/17/2024.   Specialties: Physician Assistant, Thoracic Surgery Why: Follow up appointment is at 3:30PM, please get a chest xray at 2:30PM on the 2nd floor of our building Contact information: [address], [place] [address] [phone]         Parthenia Olivia HERO, PA-C Follow up on 11/30/2024.   Specialty: Cardiology Why: Cardiology appointment is at 2:00PM Contact information: [address] [place] [address] [phone]                 Signed:  Con GORMAN Bend, PA-C  11/12/2024, 8:45 AM

## 2024-11-10 LAB — BASIC METABOLIC PANEL WITH GFR
Anion gap: 11 (ref 5–15)
BUN: 27 mg/dL — ABNORMAL HIGH (ref 8–23)
CO2: 23 mmol/L (ref 22–32)
Calcium: 8.2 mg/dL — ABNORMAL LOW (ref 8.9–10.3)
Chloride: 109 mmol/L (ref 98–111)
Creatinine, Ser: 1.53 mg/dL — ABNORMAL HIGH (ref 0.61–1.24)
GFR, Estimated: 47 mL/min — ABNORMAL LOW (ref >60)
Glucose, Bld: 61 mg/dL — ABNORMAL LOW (ref 70–99)
Potassium: 3.9 mmol/L (ref 3.5–5.1)
Sodium: 143 mmol/L (ref 135–145)

## 2024-11-10 LAB — CBC
HCT: 27.4 % — ABNORMAL LOW (ref 39.0–52.0)
Hemoglobin: 9.1 g/dL — ABNORMAL LOW (ref 13.0–17.0)
MCH: 32.4 pg (ref 26.0–34.0)
MCHC: 33.2 g/dL (ref 30.0–36.0)
MCV: 97.5 fL (ref 80.0–100.0)
Platelets: 157 K/uL (ref 150–400)
RBC: 2.81 MIL/uL — ABNORMAL LOW (ref 4.22–5.81)
RDW: 12.4 % (ref 11.5–15.5)
WBC: 7.5 K/uL (ref 4.0–10.5)
nRBC: 0 % (ref 0.0–0.2)

## 2024-11-10 LAB — GLUCOSE, CAPILLARY
Glucose-Capillary: 148 mg/dL — ABNORMAL HIGH (ref 70–99)
Glucose-Capillary: 127 mg/dL — ABNORMAL HIGH (ref 70–99)
Glucose-Capillary: 103 mg/dL — ABNORMAL HIGH (ref 70–99)
Glucose-Capillary: 183 mg/dL — ABNORMAL HIGH (ref 70–99)

## 2024-11-10 MED ORDER — AMIODARONE HCL IN DEXTROSE 360-4.14 MG/200ML-% IV SOLN: 30.0000 mg/h | INTRAVENOUS | Status: DC

## 2024-11-10 MED ORDER — AMIODARONE LOAD VIA INFUSION
150.0000 mg | Freq: Once | INTRAVENOUS | Status: AC
  Administered 2024-11-10: 150 mg via INTRAVENOUS
  Filled 2024-11-10: qty 83.34

## 2024-11-10 MED ORDER — AMIODARONE HCL IN DEXTROSE 360-4.14 MG/200ML-% IV SOLN
60.0000 mg/h | INTRAVENOUS | Status: DC
  Administered 2024-11-10: 60 mg/h via INTRAVENOUS
  Filled 2024-11-10: qty 200

## 2024-11-10 MED ORDER — AMIODARONE HCL 200 MG PO TABS
400.0000 mg | ORAL_TABLET | Freq: Two times a day (BID) | ORAL | Status: DC
  Administered 2024-11-10 – 2024-11-11 (×4): 400 mg via ORAL
  Filled 2024-11-10 (×4): qty 2

## 2024-11-10 MED ORDER — LACTULOSE 10 GM/15ML PO SOLN
20.0000 g | Freq: Once | ORAL | Status: AC
  Administered 2024-11-10: 20 g via ORAL
  Filled 2024-11-10: qty 30

## 2024-11-10 MED FILL — Heparin Sodium (Porcine) Inj 1000 Unit/ML: INTRAMUSCULAR | Qty: 10 | Status: AC

## 2024-11-10 MED FILL — Lidocaine HCl Local Soln Prefilled Syringe 100 MG/5ML (2%): INTRAMUSCULAR | Qty: 5 | Status: AC

## 2024-11-10 MED FILL — Sodium Chloride IV Soln 0.9%: INTRAVENOUS | Qty: 3000 | Status: AC

## 2024-11-10 MED FILL — Sodium Bicarbonate IV Soln 8.4%: INTRAVENOUS | Qty: 50 | Status: AC

## 2024-11-10 MED FILL — Electrolyte-R (PH 7.4) Solution: INTRAVENOUS | Qty: 4000 | Status: AC

## 2024-11-10 NOTE — Progress Notes (Addendum)
 [address] [place] [address]             [phone]      3 Days Post-Op Procedure(s) (LRB): CORONARY ARTERY BYPASS GRAFTING (CABG) TIMES ONE USING LEFT INTERNAL MAMMARY ARTERY (N/A) ECHOCARDIOGRAM, TRANSESOPHAGEAL, INTRAOPERATIVE (N/A) Subjective: Patient states he doesn't feel as good as he did yesterday, no specific complaints.   Objective: Vital signs in last 24 hours: Temp:  [97.7 F (36.5 C)-98.9 F (37.2 C)] 98.4 F (36.9 C) (11/11 0424) Pulse Rate:  [64-82] 78 (11/11 0424) Cardiac Rhythm: Atrial fibrillation (11/11 0601) Resp:  [13-20] 16 (11/10 2105) BP: (87-128)/(58-77) 96/66 (11/11 0424) SpO2:  [91 %-100 %] 98 % (11/11 0424) Weight:  [72.5 kg] 72.5 kg (11/11 0500)  Hemodynamic parameters for last 24 hours:    Intake/Output from previous day: 11/10 0701 - 11/11 0700 In: 240 [P.O.:240] Out: -  Intake/Output this shift: No intake/output data recorded.  General appearance: alert, cooperative, and no distress Neurologic: intact, A&Ox4 Heart: irregularly irregular rhythm Lungs: clear to auscultation bilaterally Abdomen: soft, non-tender; bowel sounds normal; no masses,  no organomegaly Extremities: extremities normal, atraumatic, no cyanosis or edema Wound: Clean and dry without sign of infection, looks like some superficial dehiscence of left chest tube site, no suture in place but no sign of infection  Lab Results: Recent Labs    11/09/24 0232 11/10/24 0304  WBC 9.1 7.5  HGB 9.5* 9.1*  HCT 28.2* 27.4*  PLT 133* 157   BMET:  Recent Labs    11/09/24 0232 11/10/24 0304  NA 142 143  K 3.9 3.9  CL 109 109  CO2 23 23  GLUCOSE 79 61*  BUN 22 27*  CREATININE 1.62* 1.53*  CALCIUM 8.3* 8.2*    PT/INR: No results for input(s): LABPROT, INR in the last 72 hours. ABG    Component Value Date/Time   PHART 7.402 11/07/2024 1729   HCO3 18.5 (L) 11/07/2024 1729   TCO2 19 (L) 11/07/2024 1729   ACIDBASEDEF 5.0 (H)  11/07/2024 1729   O2SAT 99 11/07/2024 1729   CBG (last 3)  Recent Labs    11/09/24 1217 11/09/24 1813 11/10/24 0648  GLUCAP 170* 162* 148*    Assessment/Plan: S/P Procedure(s) (LRB): CORONARY ARTERY BYPASS GRAFTING (CABG) TIMES ONE USING LEFT INTERNAL MAMMARY ARTERY (N/A) ECHOCARDIOGRAM, TRANSESOPHAGEAL, INTRAOPERATIVE (N/A)  Neuro: Some confusion in the ICU, resolved. A&Ox4 this AM.   CV: HX of PAF. Afib with RVR this AM, HR mostly 140s but does get up to the 170s. Will start IV Amiodarone. Will restart Eliquis . Soft BP this AM, 96/66. On Lopressor 12.5mg  BID. Was on Cardizem  but was discontinued by provider, patient is unsure why or when.   Pulm: Saturating well on RA. CXR yesterday with bibasilar atelectasis and possible trace left apical pneumothorax. Encourage IS and ambulation.   GI: Nausea resolved. -BM. Continue Reglan, will give another dose of Lactulose this AM.   Endo: Hypothyroid on home Synthroid. T2DM, preop A1C 6.4. CBGs controlled on Jardiance, Semglee 20U daily and SSI.   Renal: AKI on CKD stage 3a, baseline Cr likely around 1.2-1.3. Cr improved 1.53 this AM. No UO recorded. Under preop weight.   ID: Likely reactive leukocytosis resolved. Afebrile.   Expected postop ABLA: H/H 9.1/27.4, stable. Not clinically significant at this time.   DVT Prophylaxis: Lovenox d/c'd, on Eliquis .   Dispo: Work on heart rate and rhythm control    LOS: 4 days  Con GORMAN Bend, PA-C 11/10/2024  Patient seen and examined, agree with above In A fib with RVR- amiodarone, restart Eliquis  Doing well with cardiac rehab  Elspeth C. Kerrin, MD [facility] [phone]

## 2024-11-10 NOTE — Progress Notes (Signed)
   11/10/24 0829  Assess: MEWS Score  Temp 97.6 F (36.4 C)  BP (!) 73/51  MAP (mmHg) (!) 59  Pulse Rate (!) 126  Level of Consciousness Alert  SpO2 92 %  O2 Device Room Air  Assess: MEWS Score  MEWS Temp 0  MEWS Systolic 2  MEWS Pulse 2  MEWS RR 0  MEWS LOC 0  MEWS Score 4  MEWS Score Color Red  Assess: if the MEWS score is Yellow or Red  Were vital signs accurate and taken at a resting state? Yes  Does the patient meet 2 or more of the SIRS criteria? No  MEWS guidelines implemented  Yes, red  Treat  MEWS Interventions Considered administering scheduled or prn medications/treatments as ordered  Take Vital Signs  Increase Vital Sign Frequency  Red: Q1hr x2, continue Q4hrs until patient remains green for 12hrs  Escalate  MEWS: Escalate Red: Discuss with charge nurse and notify provider. Consider notifying RRT. If remains red for 2 hours consider need for higher level of care  Notify: Charge Nurse/RN  Name of Charge Nurse/RN Notified Research [employer]  Provider Notification  Provider Name/Title Wayne gold  Date Provider Notified 11/10/24  Time Provider Notified [phone]  Method of Notification Page  Notification Reason Change in status  Provider response Evaluate remotely;See new orders  Date of Provider Response 11/10/24  Time of Provider Response [id number]  Assess: SIRS CRITERIA  SIRS Temperature  0  SIRS Respirations  0  SIRS Pulse 1  SIRS WBC 0  SIRS Score Sum  1

## 2024-11-10 NOTE — Evaluation (Signed)
 Occupational Therapy Evaluation Patient Details Name: Frederick Miles MRN: [id number] DOB: [birth year] Today's Date: 11/10/2024   History of Present Illness   78 y.o. male admitted 11/6 for NSTEMI. 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 PMH: essential hypertension, hypothyroidism, chronic kidney stage IIIa, atrial fibrillation on Eliquis , coronary artery disease status post stent, bladder cancer, emphysema.     Clinical Impressions Pt presented in room with son present throughout the session. Pt initially reported incorrect DOB and when given IS could not recall on how to use. At this time evaluation was limited due to soft BP and reported once feeling fluttery in the head. He needed mod-max cues on sternal precautions and then reported  I remember you in my home yesterday telling me this. Pt completed bed mobility with CGA-min assist and sit to stand transfers with CGA-min assist to chair. Reviewed with son on modification on dressing and bathing at this time. Recommendation for [facility] as long as wife can assist at home 24/7 as son reported she is taking time off to assist pt.   BP  Supine: 92/62 (71) Sitting: 81/56 (65) Sitting repeat: 93/59 (71) Post transferring to chair: 79/53 (62) Sitting: 95/61 (73)     If plan is discharge home, recommend the following:   A little help with walking and/or transfers;A little help with bathing/dressing/bathroom;Assistance with cooking/housework;Direct supervision/assist for medications management;Direct supervision/assist for financial management;Assist for transportation;Help with stairs or ramp for entrance;Supervision due to cognitive status     Functional Status Assessment   Patient has had a recent decline in their functional status and demonstrates the ability to make significant improvements in function in a reasonable and predictable amount of time.     Equipment Recommendations   BSC/3in1;Tub/shower seat (RW, BSC might  not fit in bathroom follow up with family)     Recommendations for Other Services         Precautions/Restrictions   Precautions Precautions: Fall;Sternal Precaution Booklet Issued: Yes (comment) Recall of Precautions/Restrictions: Impaired Restrictions Weight Bearing Restrictions Per Provider Order: Yes RUE Weight Bearing Per Provider Order: Non weight bearing LUE Weight Bearing Per Provider Order: Non weight bearing Other Position/Activity Restrictions: Sternal precautions     Mobility Bed Mobility Overal bed mobility: Needs Assistance Bed Mobility: Supine to Sit Rolling: Contact guard assist, Min assist Sidelying to sit: Contact guard assist, Min assist       General bed mobility comments: educated on positioning    Transfers Overall transfer level: Needs assistance Equipment used: Rolling walker (2 wheels) Transfers: Sit to/from Stand Sit to Stand: Contact guard assist           General transfer comment: cues on how to complete with precautions      Balance Overall balance assessment: Needs assistance Sitting-balance support: Feet supported Sitting balance-Leahy Scale: Good     Standing balance support: Bilateral upper extremity supported Standing balance-Leahy Scale: Fair                             ADL either performed or assessed with clinical judgement   ADL Overall ADL's : Needs assistance/impaired Eating/Feeding: Independent;Sitting   Grooming: Wash/dry hands;Wash/dry face;Set up;Supervision/safety;Sitting   Upper Body Bathing: Contact guard assist;Minimal assistance;Sitting   Lower Body Bathing: Minimal assistance;Sit to/from stand   Upper Body Dressing : Contact guard assist;Sitting   Lower Body Dressing: Minimal assistance;Sit to/from stand   Toilet Transfer: Contact guard assist;Cueing for safety;Cueing for sequencing;Minimal assistance;Rolling walker (2  wheels)           Functional mobility during ADLs: Contact  guard assist;Minimal assistance;Rolling walker (2 wheels);Cueing for sequencing;Cueing for safety       Vision         Perception         Praxis         Pertinent Vitals/Pain Pain Assessment Pain Assessment: No/denies pain Pain Score: 0-No pain Facial Expression: Relaxed, neutral Body Movements: Absence of movements Muscle Tension: Relaxed Compliance with ventilator (intubated pts.): N/A Vocalization (extubated pts.): N/A CPOT Total: 0     Extremity/Trunk Assessment Upper Extremity Assessment Upper Extremity Assessment: Overall WFL for tasks assessed (within sternal precautions)   Lower Extremity Assessment Lower Extremity Assessment: Defer to PT evaluation   Cervical / Trunk Assessment Cervical / Trunk Assessment: Kyphotic   Communication Communication Communication: Impaired Factors Affecting Communication: Hearing impaired   Cognition Arousal: Alert Behavior During Therapy: WFL for tasks assessed/performed Cognition: Cognition impaired   Orientation impairments: Situation Awareness: Online awareness impaired Memory impairment (select all impairments): Short-term memory Attention impairment (select first level of impairment): Alternating attention Executive functioning impairment (select all impairments): Reasoning, Problem solving OT - Cognition Comments: Pt reported that they met this therapist before in their home yesterday. Did not intially report the correct DOB.                 Following commands: Impaired Following commands impaired: Follows one step commands inconsistently     Cueing  General Comments   Cueing Techniques: Verbal cues;Gestural cues      Exercises     Shoulder Instructions      Home Living Family/patient expects to be discharged to:: Private residence Living Arrangements: Spouse/significant other Available Help at Discharge: Family;Available 24 hours/day Type of Home: House Home Access: Level entry     Home  Layout: Two level;Bed/bath upstairs;Able to live on main level with bedroom/bathroom Alternate Level Stairs-Number of Steps: 12 Alternate Level Stairs-Rails: Right Bathroom Shower/Tub:  (all showers on seconds level)   Bathroom Toilet: Standard Bathroom Accessibility: Yes   Home Equipment: None          Prior Functioning/Environment Prior Level of Function : Independent/Modified Independent;Driving             Mobility Comments: ind, likes to fishing  hobby, retired from [employer]. Denies falling ADLs Comments: Ind, likes to fish for bass    OT Problem List: Decreased strength;Decreased activity tolerance;Impaired balance (sitting and/or standing);Decreased safety awareness;Decreased knowledge of use of DME or AE   OT Treatment/Interventions: Self-care/ADL training;Therapeutic exercise;DME and/or AE instruction;Therapeutic activities;Patient/family education;Balance training      OT Goals(Current goals can be found in the care plan section)   Acute Rehab OT Goals Patient Stated Goal: to get better OT Goal Formulation: With patient Time For Goal Achievement: 11/24/24 Potential to Achieve Goals: Fair   OT Frequency:  Min 2X/week    Co-evaluation              AM-PAC OT 6 Clicks Daily Activity     Outcome Measure Help from another person eating meals?: A Little Help from another person taking care of personal grooming?: A Little Help from another person toileting, which includes using toliet, bedpan, or urinal?: A Little Help from another person bathing (including washing, rinsing, drying)?: A Little Help from another person to put on and taking off regular upper body clothing?: A Little Help from another person to put on and taking off regular lower  body clothing?: A Little 6 Click Score: 18   End of Session Equipment Utilized During Treatment: Gait belt;Rolling walker (2 wheels) Nurse Communication: Mobility status  Activity Tolerance:  Patient tolerated treatment well Patient left: in chair;with call bell/phone within reach;with chair alarm set  OT Visit Diagnosis: Unsteadiness on feet (R26.81);Other abnormalities of gait and mobility (R26.89);Muscle weakness (generalized) (M62.81)                Time: 9078-9040 OT Time Calculation (min): 38 min Charges:  OT General Charges $OT Visit: 1 Visit OT Evaluation $OT Eval Low Complexity: 1 Low OT Treatments $Self Care/Home Management : 8-22 mins  Warrick POUR OTR/L  Acute Rehab Services  [phone] office number   Warrick Berber 11/10/2024, 10:13 AM

## 2024-11-11 LAB — BASIC METABOLIC PANEL WITH GFR
Anion gap: 8 (ref 5–15)
BUN: 32 mg/dL — ABNORMAL HIGH (ref 8–23)
CO2: 23 mmol/L (ref 22–32)
Calcium: 8 mg/dL — ABNORMAL LOW (ref 8.9–10.3)
Chloride: 111 mmol/L (ref 98–111)
Creatinine, Ser: 1.84 mg/dL — ABNORMAL HIGH (ref 0.61–1.24)
GFR, Estimated: 37 mL/min — ABNORMAL LOW (ref >60)
Glucose, Bld: 213 mg/dL — ABNORMAL HIGH (ref 70–99)
Potassium: 4 mmol/L (ref 3.5–5.1)
Sodium: 142 mmol/L (ref 135–145)

## 2024-11-11 LAB — HCV INTERPRETATION

## 2024-11-11 LAB — GLUCOSE, CAPILLARY
Glucose-Capillary: 123 mg/dL — ABNORMAL HIGH (ref 70–99)
Glucose-Capillary: 131 mg/dL — ABNORMAL HIGH (ref 70–99)
Glucose-Capillary: 156 mg/dL — ABNORMAL HIGH (ref 70–99)
Glucose-Capillary: 169 mg/dL — ABNORMAL HIGH (ref 70–99)
Glucose-Capillary: 54 mg/dL — ABNORMAL LOW (ref 70–99)
Glucose-Capillary: 68 mg/dL — ABNORMAL LOW (ref 70–99)

## 2024-11-11 LAB — HCV AB W REFLEX TO QUANT PCR: HCV Ab: NONREACTIVE

## 2024-11-11 MED ORDER — LACTULOSE 10 GM/15ML PO SOLN: 20.0000 g | Freq: Two times a day (BID) | ORAL | Status: DC | PRN

## 2024-11-11 MED ORDER — METFORMIN HCL 500 MG PO TABS
500.0000 mg | ORAL_TABLET | Freq: Every day | ORAL | Status: DC
  Administered 2024-11-11 – 2024-11-12 (×2): 500 mg via ORAL
  Filled 2024-11-11 (×2): qty 1

## 2024-11-11 MED ORDER — BISACODYL 10 MG RE SUPP
10.0000 mg | Freq: Once | RECTAL | Status: AC
  Administered 2024-11-11: 10 mg via RECTAL
  Filled 2024-11-11: qty 1

## 2024-11-11 MED FILL — Heparin Sodium (Porcine) Inj 1000 Unit/ML: Qty: 1000 | Status: AC

## 2024-11-11 MED FILL — Potassium Chloride Inj 2 mEq/ML: INTRAVENOUS | Qty: 40 | Status: AC

## 2024-11-11 MED FILL — Lidocaine HCl Local Preservative Free (PF) Inj 2%: INTRAMUSCULAR | Qty: 14 | Status: AC

## 2024-11-11 NOTE — Progress Notes (Signed)
 Morning labs drawn however have not been resulted. Notified phlebotomy and they confirmed that the labs were drawn but tube station is not working and stuck in limbo. Maintenance working on getting them cleared.

## 2024-11-11 NOTE — Plan of Care (Signed)
   Problem: Health Behavior/Discharge Planning: Goal: Ability to manage health-related needs will improve Outcome: Progressing

## 2024-11-11 NOTE — Progress Notes (Signed)
 Physical Therapy Treatment and Discharge Note Patient Details Name: Frederick Miles MRN: [id number] DOB: 1946-07-07 Today's Date: 11/11/2024   History of Present Illness 78 y.o. male admitted 11/6 for NSTEMI. 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 PMH: essential hypertension, hypothyroidism, chronic kidney stage IIIa, atrial fibrillation on Eliquis , coronary artery disease status post stent, bladder cancer, emphysema.    PT Comments  Pt is currently presenting at supervision level due to difficulty maintaining sternal precautions for bed mobility, sit to stand, gait and stairs per home set up without an AD. Pt needs heavy assistance to maintain sternal precautions. Family is aware of sternal precautions and will be able to assist pt at home. Recommending to follow physician recommendation for physical therapy once discharged from acute care hospital setting. Pt will be discharged from acute care skilled physical therapy services at this time. Please re-consult if further needs arise.      If plan is discharge home, recommend the following: A little help with walking and/or transfers;A little help with bathing/dressing/bathroom;Assistance with cooking/housework;Direct supervision/assist for financial management;Direct supervision/assist for medications management;Assist for transportation;Supervision due to cognitive status;Help with stairs or ramp for entrance     Equipment Recommendations  None recommended by PT       Precautions / Restrictions Precautions Precautions: Fall;Sternal Precaution Booklet Issued: No Recall of Precautions/Restrictions: Impaired Restrictions Weight Bearing Restrictions Per Provider Order: Yes RUE Weight Bearing Per Provider Order: Non weight bearing LUE Weight Bearing Per Provider Order: Non weight bearing Other Position/Activity Restrictions: Sternal precautions     Mobility  Bed Mobility Overal bed mobility: Needs Assistance Bed Mobility:  Rolling, Sidelying to Sit Rolling: Supervision Sidelying to sit: Supervision       General bed mobility comments: heavy verbal cues to prevent pulling on bed rail in order to maintain sternal precautions    Transfers Overall transfer level: Needs assistance Equipment used: None Transfers: Sit to/from Stand Sit to Stand: Supervision           General transfer comment: heavy cues for safe hand placement in order to prevent abduction and pushing up from EOB. Multi modal cues to get pt to push up from knees or cross arms for 3x sit to stand.    Ambulation/Gait Ambulation/Gait assistance: Supervision Gait Distance (Feet): 450 Feet Assistive device: None Gait Pattern/deviations: Step-through pattern, Decreased stride length Gait velocity: decreased Gait velocity interpretation: >2.62 ft/sec, indicative of community ambulatory   General Gait Details: Step through gait pattern, no loss of balance. Pt requires cues throughout to not reach outside heart tube:   Stairs Stairs: Yes Stairs assistance: Supervision Stair Management: One rail Right, Alternating pattern, Forwards, Step to pattern Number of Stairs: 3 General stair comments: alternating ascending, step to descending. Supervision for safety and to maintain sternal precautions     Balance Overall balance assessment: Needs assistance Sitting-balance support: Feet supported Sitting balance-Leahy Scale: Good     Standing balance support: Bilateral upper extremity supported Standing balance-Leahy Scale: Fair Standing balance comment: supervision for safety        Communication Communication Communication: No apparent difficulties  Cognition Arousal: Alert Behavior During Therapy: WFL for tasks assessed/performed   PT - Cognitive impairments: Awareness, Problem solving, Safety/Judgement   PT - Cognition Comments: pt has significant difficulty remembering precautions and does not have good re-call with  reminders Following commands: Impaired Following commands impaired: Follows one step commands inconsistently    Cueing Cueing Techniques: Verbal cues, Gestural cues     General Comments General comments (  skin integrity, edema, etc.): Continued to review precautions throughout session with poor re-call. BIL was present and able to state precautions.      Pertinent Vitals/Pain Pain Assessment Pain Assessment: No/denies pain     PT Goals (current goals can now be found in the care plan section) Acute Rehab PT Goals Patient Stated Goal: Go home PT Goal Formulation: With patient Time For Goal Achievement: 11/23/24 Potential to Achieve Goals: Good Progress towards PT goals: Goals met and updated - see care plan    Frequency    Min 2X/week      PT Plan  Discharge from acute physical therapy services   AM-PAC PT 6 Clicks Mobility   Outcome Measure  Help needed turning from your back to your side while in a flat bed without using bedrails?: A Little Help needed moving from lying on your back to sitting on the side of a flat bed without using bedrails?: A Little Help needed moving to and from a bed to a chair (including a wheelchair)?: A Little Help needed standing up from a chair using your arms (e.g., wheelchair or bedside chair)?: A Little Help needed to walk in hospital room?: A Little Help needed climbing 3-5 steps with a railing? : A Little 6 Click Score: 18    End of Session Equipment Utilized During Treatment: Gait belt Activity Tolerance: Patient tolerated treatment well Patient left: in chair;with call bell/phone within reach;with chair alarm set;with family/visitor present Nurse Communication: Mobility status PT Visit Diagnosis: Other abnormalities of gait and mobility (R26.89);Muscle weakness (generalized) (M62.81);Pain;Other symptoms and signs involving the nervous system (R29.898) Pain - part of body:  (sternum)     Time: 8870-8846 PT Time Calculation  (min) (ACUTE ONLY): 24 min  Charges:    $Therapeutic Activity: 23-37 mins PT General Charges $$ ACUTE PT VISIT: 1 Visit                     Dorothyann Maier, DPT, CLT  Acute Rehabilitation Services Office: [phone] (Secure chat preferred)    Dorothyann VEAR Maier 11/11/2024, 1:14 PM

## 2024-11-11 NOTE — Progress Notes (Addendum)
 Pt declined ambulation at this time, sts he is awaiting visitors, including his lawyer. Also declined education but we did discuss CRPII. He practiced IS, which he does need verbal reinforcement on correct use. Discussed need for ambulation x3 a day in hospital and at home. Left education materials with him ([facility] booklet, exercise guidelines, diet, and brochure for CRPII). I plan to call his wife to discuss education considering pt's forgetfulness. RN sts pt is strong but impulsive and required many cues on how to use RW this am.   Will refer to [facility] CRPII.  [phone] Aliene Aris BS, ACSM-CEP 11/11/2024 11:35 AM  Called wife and discussed sternal precautions, exercise, and CRPII. Wife voiced reception and appreciative.  [phone] Aliene Aris BS, ACSM-CEP 11/11/2024 11:53 AM

## 2024-11-11 NOTE — Progress Notes (Addendum)
 [address] [place] [address]             [phone]      4 Days Post-Op Procedure(s) (LRB): CORONARY ARTERY BYPASS GRAFTING (CABG) TIMES ONE USING LEFT INTERNAL MAMMARY ARTERY (N/A) ECHOCARDIOGRAM, TRANSESOPHAGEAL, INTRAOPERATIVE (N/A) Subjective: Patient reports he feels the best he has in years and is breathing better than he ever has  Objective: Vital signs in last 24 hours: Temp:  [97.6 F (36.4 C)-98.3 F (36.8 C)] 98.3 F (36.8 C) (11/12 0305) Pulse Rate:  [51-126] 51 (11/12 0305) Cardiac Rhythm: Sinus bradycardia (11/11 2000) Resp:  [12-17] 15 (11/12 0305) BP: (73-107)/(51-74) 103/73 (11/12 0305) SpO2:  [89 %-99 %] 98 % (11/12 0305) Weight:  [72.7 kg] 72.7 kg (11/12 0620)  Hemodynamic parameters for last 24 hours:    Intake/Output from previous day: 11/11 0701 - 11/12 0700 In: 91.1 [I.V.:91.1] Out: 250 [Urine:250] Intake/Output this shift: No intake/output data recorded.  General appearance: alert, cooperative, and no distress Neurologic: intact and A&Ox4 Heart: sinus bradycardia, no murmur Lungs: clear to auscultation bilaterally Abdomen: soft, non-tender; bowel sounds normal; no masses,  no organomegaly Extremities: extremities normal, atraumatic, no cyanosis or edema Wound: Clean and dry without sign of infection  Lab Results: Recent Labs    11/09/24 0232 11/10/24 0304  WBC 9.1 7.5  HGB 9.5* 9.1*  HCT 28.2* 27.4*  PLT 133* 157   BMET:  Recent Labs    11/09/24 0232 11/10/24 0304  NA 142 143  K 3.9 3.9  CL 109 109  CO2 23 23  GLUCOSE 79 61*  BUN 22 27*  CREATININE 1.62* 1.53*  CALCIUM 8.3* 8.2*    PT/INR: No results for input(s): LABPROT, INR in the last 72 hours. ABG    Component Value Date/Time   PHART 7.402 11/07/2024 1729   HCO3 18.5 (L) 11/07/2024 1729   TCO2 19 (L) 11/07/2024 1729   ACIDBASEDEF 5.0 (H) 11/07/2024 1729   O2SAT 99 11/07/2024 1729   CBG (last 3)  Recent Labs     11/10/24 1613 11/10/24 2137 11/11/24 0616  GLUCAP 103* 183* 123*    Assessment/Plan: S/P Procedure(s) (LRB): CORONARY ARTERY BYPASS GRAFTING (CABG) TIMES ONE USING LEFT INTERNAL MAMMARY ARTERY (N/A) ECHOCARDIOGRAM, TRANSESOPHAGEAL, INTRAOPERATIVE (N/A)  Neuro: Some confusion in the ICU, looks like some confusion during OT evaluation yesterday as well. A&Ox4 for me this AM.   CV: HX of PAF. Developed afib with RVR yesterday AM and converted to sinus bradycardia with IV Amiodarone bolus. Also became hypotensive so IV Amiodarone was transitioned to PO Amiodarone 400mg  BID. SB, HR 50s this AM, drops to the high 40s at times, may need to d/c Lopressor if worsens. On Eliquis . Soft BP improving, BP 103/73 this AM. On Lopressor 12.5mg  BID. Was on Cardizem  but was discontinued by provider, patient is unsure why or when.    Pulm: Saturating well on RA. CXR yesterday with bibasilar atelectasis and possible trace left apical pneumothorax. Encourage IS and ambulation.    GI: Nausea resolved. -BM. Will give suppository this AM   Endo: Hypothyroid on home Synthroid. T2DM, preop A1C 6.4. CBGs controlled on Jardiance, Semglee 20U daily and SSI.    Renal: AKI on CKD stage 3a, baseline Cr likely around 1.2-1.3. Cr improved 1.53 this AM. No UO recorded. Under preop weight. No Lasix. BMET pending.    DVT Prophylaxis: Lovenox d/c'd, on Eliquis .    Dispo: May be able to d/c  home tomorrow if heart rate and rhythm remain stable and he can start moving his bowels.      LOS: 5 days    Con GORMAN Bend, PA-C 11/11/2024 Looks great AF with RVR- converted to SB with amiodarone with low BP Continue PO amiodarone, already on Eliquis  Hold beta blocker today Hopefully home tomorrow  Elspeth C. Kerrin, MD [facility] [phone]

## 2024-11-12 ENCOUNTER — Inpatient Hospital Stay (HOSPITAL_COMMUNITY)

## 2024-11-12 DIAGNOSIS — Z951 Presence of aortocoronary bypass graft: Secondary | ICD-10-CM

## 2024-11-12 LAB — CBC
HCT: 25.9 % — ABNORMAL LOW (ref 39.0–52.0)
Hemoglobin: 8.8 g/dL — ABNORMAL LOW (ref 13.0–17.0)
MCH: 32.5 pg (ref 26.0–34.0)
MCHC: 34 g/dL (ref 30.0–36.0)
MCV: 95.6 fL (ref 80.0–100.0)
Platelets: 206 K/uL (ref 150–400)
RBC: 2.71 MIL/uL — ABNORMAL LOW (ref 4.22–5.81)
RDW: 12.5 % (ref 11.5–15.5)
WBC: 5.6 K/uL (ref 4.0–10.5)
nRBC: 0 % (ref 0.0–0.2)

## 2024-11-12 LAB — BASIC METABOLIC PANEL WITH GFR
Anion gap: 9 (ref 5–15)
BUN: 32 mg/dL — ABNORMAL HIGH (ref 8–23)
CO2: 22 mmol/L (ref 22–32)
Calcium: 7.7 mg/dL — ABNORMAL LOW (ref 8.9–10.3)
Chloride: 109 mmol/L (ref 98–111)
Creatinine, Ser: 1.52 mg/dL — ABNORMAL HIGH (ref 0.61–1.24)
GFR, Estimated: 47 mL/min — ABNORMAL LOW (ref >60)
Glucose, Bld: 73 mg/dL (ref 70–99)
Potassium: 4 mmol/L (ref 3.5–5.1)
Sodium: 140 mmol/L (ref 135–145)

## 2024-11-12 LAB — GLUCOSE, CAPILLARY
Glucose-Capillary: 73 mg/dL (ref 70–99)
Glucose-Capillary: 64 mg/dL — ABNORMAL LOW (ref 70–99)
Glucose-Capillary: 121 mg/dL — ABNORMAL HIGH (ref 70–99)

## 2024-11-12 MED ORDER — ACETAMINOPHEN 325 MG PO TABS: 650.0000 mg | ORAL_TABLET | Freq: Four times a day (QID) | ORAL | Status: DC | PRN

## 2024-11-12 MED ORDER — APIXABAN 5 MG PO TABS: 5.0000 mg | ORAL_TABLET | Freq: Two times a day (BID) | ORAL | 1 refills | Status: AC

## 2024-11-12 MED ORDER — AMIODARONE HCL 200 MG PO TABS
200.0000 mg | ORAL_TABLET | Freq: Two times a day (BID) | ORAL | Status: DC
  Administered 2024-11-12: 200 mg via ORAL
  Filled 2024-11-12: qty 1

## 2024-11-12 MED ORDER — AMIODARONE HCL 200 MG PO TABS: ORAL_TABLET | ORAL | 1 refills | Status: DC

## 2024-11-12 MED ORDER — TRAMADOL HCL 50 MG PO TABS: 50.0000 mg | ORAL_TABLET | Freq: Four times a day (QID) | ORAL | 0 refills | Status: AC | PRN

## 2024-11-12 NOTE — TOC Transition Note (Signed)
 Transition of Care [facility]) - Discharge Note   Patient Details  Name: Frederick Miles MRN: [id number] Date of Birth: [birth year]  Transition of [facility]) CM/SW Contact:  Tom-Johnson, Harvest Muskrat, RN Phone Number: 11/12/2024, 9:42 AM   Clinical Narrative:     Patient is scheduled for discharge today.  Readmission Risk Assessment done. Outpatient referral, hospital f/u and discharge instructions on AVS. Patient declined DME recommendation, states he does not need them.  Wife, Rock to transport at discharge.  No further ICM needs noted.     Final next level of care: Home/Self Care Barriers to Discharge: Barriers Resolved   Patient Goals and CMS Choice Patient states their goals for this hospitalization and ongoing recovery are:: To return home CMS Medicare.gov Compare Post Acute Care list provided to:: Patient Choice offered to / list presented to : Patient      Discharge Placement                Patient to be transferred to facility by: Wife Name of family member notified: [facility] Additional resources added to the After Visit Summary for                  DME Arranged:  (Declined)         HH Arranged: NA HH Agency: NA        Social Drivers of Health (SDOH) Interventions SDOH Screenings   Food Insecurity: No Food Insecurity (11/05/2024)  Housing: Low Risk  (11/05/2024)  Transportation Needs: No Transportation Needs (11/05/2024)  Utilities: Not At Risk (11/05/2024)  Physical Activity: Unknown (08/11/2018)  Social Connections: Moderately Integrated (11/05/2024)  Tobacco Use: Medium Risk (11/07/2024)     Readmission Risk Interventions    11/12/2024    9:41 AM  Readmission Risk Prevention Plan  Transportation Screening Complete  Medication Review (RN Care Manager) Referral to Pharmacy  PCP or Specialist appointment within 3-5 days of discharge Complete  HRI or Home Care Consult Complete  SW Recovery Care/Counseling  Consult Complete  Palliative Care Screening Not Applicable  Skilled Nursing Facility Not Applicable

## 2024-11-12 NOTE — Progress Notes (Signed)
 Hypoglycemic Event  CBG: 54  Treatment: 8 oz juice/soda  Symptoms: None  Follow-up CBG: Time:0110 CBG Result:73   Possible Reasons for Event: Inadequate meal intake and Medication regimen:    Comments/MD notified:    Frederick Miles

## 2024-11-12 NOTE — Progress Notes (Signed)
 Patient's nighttime CBG was 169. Covered with 4 units. Patient's device a difference in numbers. Double the results and compared to what the patient's reading was.  Patient given orange juice. Rechecked CBG, still low. Patient offered another snack but patient declined. States that he usually eats 3 reeses peanut butter cups at home when this happens.   Patient again offered ice cream, applesauce, ensure? Patient declined.   Will continue to monitor

## 2024-11-12 NOTE — Progress Notes (Signed)
      [address] [place] [employer] [place] [address]             [phone]      5 Days Post-Op Procedure(s) (LRB): CORONARY ARTERY BYPASS GRAFTING (CABG) TIMES ONE USING LEFT INTERNAL MAMMARY ARTERY (N/A) ECHOCARDIOGRAM, TRANSESOPHAGEAL, INTRAOPERATIVE (N/A) Subjective: Patient reports he feels great and would like to go home  Objective: Vital signs in last 24 hours: Temp:  [97.8 F (36.6 C)-98.8 F (37.1 C)] 97.8 F (36.6 C) (11/13 0419) Pulse Rate:  [50-81] 50 (11/13 0419) Cardiac Rhythm: Sinus bradycardia (11/12 2100) Resp:  [15-16] 16 (11/12 1625) BP: (102-127)/(63-72) 103/64 (11/13 0419) SpO2:  [95 %-100 %] 95 % (11/12 1625) Weight:  [74.1 kg] 74.1 kg (11/13 0419)  Hemodynamic parameters for last 24 hours:    Intake/Output from previous day: 11/12 0701 - 11/13 0700 In: 0  Out: 350 [Urine:350] Intake/Output this shift: No intake/output data recorded.  General appearance: alert, cooperative, and no distress Neurologic: intact and A&Ox4 Heart: sinus bradycardia, no murmur Lungs: clear to auscultation bilaterally Abdomen: soft, non-tender; bowel sounds normal; no masses,  no organomegaly Extremities: extremities normal, atraumatic, no cyanosis or edema Wound: Clean and dry without sign of infection  Lab Results: Recent Labs    11/10/24 0304 11/12/24 0322  WBC 7.5 5.6  HGB 9.1* 8.8*  HCT 27.4* 25.9*  PLT 157 206   BMET:  Recent Labs    11/11/24 1418 11/12/24 0322  NA 142 140  K 4.0 4.0  CL 111 109  CO2 23 22  GLUCOSE 213* 73  BUN 32* 32*  CREATININE 1.84* 1.52*  CALCIUM 8.0* 7.7*    PT/INR: No results for input(s): LABPROT, INR in the last 72 hours. ABG    Component Value Date/Time   PHART 7.402 11/07/2024 1729   HCO3 18.5 (L) 11/07/2024 1729   TCO2 19 (L) 11/07/2024 1729   ACIDBASEDEF 5.0 (H) 11/07/2024 1729   O2SAT 99 11/07/2024 1729   CBG (last 3)  Recent Labs    11/12/24 0109 11/12/24 0422 11/12/24 0606  GLUCAP 73  64* 121*    Assessment/Plan: S/P Procedure(s) (LRB): CORONARY ARTERY BYPASS GRAFTING (CABG) TIMES ONE USING LEFT INTERNAL MAMMARY ARTERY (N/A) ECHOCARDIOGRAM, TRANSESOPHAGEAL, INTRAOPERATIVE (N/A)  Neuro: Some confusion in the ICU, looks like some confusion during OT evaluation and forgetfulness at times. A&Ox4 for me this AM.   CV: HX of PAF. Developed afib with RVR converted to sinus bradycardia with IV Amiodarone bolus. On PO Amiodarone 400mg  BID. SB, HR 48-50, Lopressor has been d/c'd. Reports his HR usually runs low. On Eliquis . NSTEMI, no Plavix with Eliquis . Soft BP improving, BP 103/64 this AM.    Pulm: Saturating well on RA. Last CXR with bibasilar atelectasis, no pneumothorax. Encourage IS and ambulation.    GI: Nausea resolved. +BM x 2 yesterday.   Endo: Hypothyroid on home Synthroid. T2DM, preop A1C 6.4. CBGs controlled on home Metformin, Jardiance, Semglee 20U daily and SSI. He did have some hypoglycemia early this AM, resolved. Will restart home meds at discharge.    Renal: AKI on CKD stage 3a, baseline Cr likely around 1.2-1.3. Cr improved 1.52 this AM. No UO recorded. Under preop weight. No Lasix.   DVT Prophylaxis: Lovenox d/c'd, on Eliquis .    Dispo: Plan to d/c home today     LOS: 6 days    Con GORMAN Bend, PA-C 11/12/2024

## 2024-11-12 NOTE — Progress Notes (Signed)
 Reviewed AVS, patient expressed understanding of medications, MD follow up reviewed.   Removed IV, Site clean, dry and intact.  See LDA for information on wounds at discharge. Patient states all belongings brought to the hospital at time of admission are accounted for and packed to take home.  Patient informed and expressed understanding where to pick up discharge medications.  [facility] contacted to transport patient to entrance A, where family member was waiting in vehicle to transport home.

## 2024-11-12 NOTE — Progress Notes (Signed)
 11/12/2024 9:15 AM ----------------------------------------------Patient assessed by the [facility]------------------------------------   Chart reviewed:Yes   Documentation gaps:none  Labs, test, and orders reviewed: yes  30-day Readmission: No  Discharge order: Yes  Current discharge plan: Home with family support.   Barrier to discharge before 11am: Hopefully none, Pt. Is not DC Lounge appropriate. Wife is en route and DC RN to review AVS with patient and family.    Intervention provided by [facility] team: EPIC secure chat with primary RN   Barrier resolved: Yes   Shulamit Donofrio, RN [employer]

## 2024-11-13 ENCOUNTER — Telehealth (HOSPITAL_COMMUNITY): Payer: Self-pay

## 2024-11-13 NOTE — Telephone Encounter (Signed)
 Called patient to see if he was interested in Cardiac Rehab, LVMTCB

## 2024-11-16 ENCOUNTER — Other Ambulatory Visit: Payer: Self-pay | Admitting: Thoracic Surgery (Cardiothoracic Vascular Surgery)

## 2024-11-16 DIAGNOSIS — Z951 Presence of aortocoronary bypass graft: Secondary | ICD-10-CM

## 2024-11-17 ENCOUNTER — Ambulatory Visit (INDEPENDENT_AMBULATORY_CARE_PROVIDER_SITE_OTHER)

## 2024-11-17 ENCOUNTER — Ambulatory Visit
Admission: RE | Admit: 2024-11-17 | Discharge: 2024-11-17 | Disposition: A | Discharge status: Home / Self Care | Source: Ambulatory Visit | Attending: Cardiology | Admitting: Cardiology

## 2024-11-17 VITALS — BP 99/63 | HR 50 | Resp 20 | Wt 163.5 lb

## 2024-11-17 DIAGNOSIS — Z951 Presence of aortocoronary bypass graft: Secondary | ICD-10-CM | POA: Insufficient documentation

## 2024-11-17 NOTE — Progress Notes (Unsigned)
 [address] [place] [address]             [phone]       HPI:  Patient returns for routine postoperative follow-up having undergone coronary artery bypass grafting x1 (left internal mammary artery to LAD) on 11/07/2024 with Dr. Kerrin.  The patient's early postoperative recovery while in the hospital was notable for restarting eliquis  for paroxysmal atrial fibrillation. He did develop atrial fibrillation with RVR and was started on IV amiodarone. He converted back to NSR and was transitioned to oral amiodarone. He was stable for discharge home on 11/12/2024.   Since hospital discharge the patient reports that he as been doing very well.  He has been very active and has been walking multiple times a day around 10 minutes each time.  He continues to use his incentive spirometer.  His pain is well-controlled without the use of any medications.  He has been using Tylenol  as needed.  He does report having some episodes of feeling off/wobbly after standing.  His blood pressure is decreased at today's visit. He denies chest pain, shortness of breath and lower leg edema.   Allergies as of 11/17/2024       Reactions   Quinolones Other (See Comments)    Unknown        Medication List        Accurate as of November 17, 2024  4:02 PM. If you have any questions, ask your nurse or doctor.          acetaminophen  325 MG tablet Commonly known as: Tylenol  Take 2 tablets (650 mg total) by mouth every 6 (six) hours as needed.   amiodarone 200 MG tablet Commonly known as: PACERONE Take 1 tablet twice per day for 7 days, then take 1 tablet daily thereafter   apixaban  5 MG Tabs tablet Commonly known as: Eliquis  Take 1 tablet (5 mg total) by mouth 2 (two) times daily.   atorvastatin 40 MG tablet Commonly known as: LIPITOR Take 40 mg by mouth daily.   empagliflozin 10 MG Tabs tablet Commonly known as: JARDIANCE Take 10 mg by mouth daily.    FreeStyle Libre 2 Sensor Misc CHANGE EVERY 14 DAYS TO MONITOR BLOOD GLUCOSE CONTINUOUSLY   insulin  lispro 100 UNIT/ML injection Commonly known as: HUMALOG Inject 6-14 Units into the skin 3 (three) times daily with meals.   Lantus SoloStar 100 UNIT/ML Solostar Pen Generic drug: insulin  glargine Inject 25 Units into the skin at bedtime.   levothyroxine 100 MCG tablet Commonly known as: SYNTHROID Take 100 mcg by mouth daily.   Magnesium  500 MG Caps Take 500 mg by mouth daily.   metFORMIN 500 MG tablet Commonly known as: GLUCOPHAGE Take 500 mg by mouth daily.   omeprazole 20 MG capsule Commonly known as: PRILOSEC Take 20 mg by mouth daily.   QC TUMERIC COMPLEX PO Take 1 capsule by mouth daily.   traMADol 50 MG tablet Commonly known as: ULTRAM Take 1 tablet (50 mg total) by mouth every 6 (six) hours as needed for severe pain (pain score 7-10).         ROS  Review of Systems  Constitutional: Negative.  Negative for fever and malaise/fatigue.  Respiratory:  Negative for cough, shortness of breath and wheezing.   Cardiovascular:  Negative for chest pain and leg swelling.  Neurological:  Negative for dizziness.     BP 99/63 (BP Location: Right Arm, Patient Position:  Sitting, Cuff Size: Normal)   Pulse (!) 50   Resp 20   Wt 163 lb 8 oz (74.2 kg)   SpO2 96% Comment: RA  BMI 23.46 kg/m   Physical Exam Constitutional:      Appearance: Normal appearance.  HENT:     Head: Normocephalic and atraumatic.  Cardiovascular:     Rate and Rhythm: Normal rate and regular rhythm.     Heart sounds: Normal heart sounds, S1 normal and S2 normal.  Pulmonary:     Effort: Pulmonary effort is normal.     Breath sounds: Normal breath sounds.  Skin:    General: Skin is warm and dry.      Neurological:     General: No focal deficit present.     Mental Status: He is alert and oriented to person, place, and time.       Imaging: CLINICAL DATA:  Status post CABG.    EXAM: CHEST - 2 VIEW   COMPARISON:  Chest CT dated 11/12/2024.   FINDINGS: Small bilateral pleural effusions and bibasilar atelectasis. No pneumothorax. Stable cardiac silhouette. Median sternotomy wires and CABG vascular clips. No acute osseous pathology.   IMPRESSION: Small bilateral pleural effusions and bibasilar atelectasis.     Electronically Signed   By: Vanetta Chou M.D.   On: 11/17/2024 15:40     Assessment/Plan:  S/P CABG x 1 -We reviewed today's chest x ray. Small bilateral pleural effusions.  He is currently asymptomatic at this time.  He should continue to use his incentive spirometer -Discussed continuation of sternal precautions until a full 6 weeks from surgery.  Continue to avoid lifting over 10 pounds -He should continue to increase his activity/exercise as tolerated -Keep incision sites clean and dry -Discussed that he may be experiencing episodes of orthostatic hypotension after standing.  He should start checking his blood pressure at home and record readings.  If he becomes symptomatic of low readings with lightheadedness, dizziness, syncopal episodes then he needs to reach out to our clinic or cardiology for possible medication management.  Continue to stay hydrated and try to drink plenty of fluids -He has follow-up with cardiology on 11/30/2024 -Follow-up with [facility] in 3 weeks with chest x-ray where we will discuss driving and clearance for cardiac rehab   Manuelita CHRISTELLA Rough, PA-C 4:02 PM 11/17/24

## 2024-11-18 NOTE — Patient Instructions (Signed)
-  follow up in 3 weeks with chest xray

## 2024-11-18 NOTE — Progress Notes (Signed)
 Cardiology Office Note:  .   Date:  11/30/2024  ID:  Frederick Miles, DOB 23-Oct-1946, MRN [id number] PCP: Onita Rush, MD  [facility] Providers Cardiologist:  Ozell Fell, MD    History of Present Illness: Frederick Miles is a 78 y.o. 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    She saw Dr. Fell last year and echo ordred for murmur-aortic sclerosis, incidental finding of thoracic aneurysm so CTA ordred and 4.3 cm needs yearly f/u.  I saw the patient 10/06/24, labs checked and K high. I repeated labs and K normal kidney function up some. I asked him to reduce hydrochlorothiazide  12.5 mg daily and keep track of BP's. Glucose also high.  Patient admitted with NSTEMI 11/06/24 Cardiac catheterization the evening of 11/07 revealed severe, 99 %, stenosis of proximal LAD at edge of stent. He underwent  coronary artery bypass grafting x1 (left internal mammary artery to LAD) on 11/07/2024 with Dr. Kerrin. He was not started on Plavix for NSTEMI since he was on Eliquis  and ASA.  Patient comes in with his wife for f/u. He feels great. No chest pain, dyspnea, edema, On midodrine  for low BP's.  BP's at home 100/60's.He has been weak especially weak legs.     ROS:    Studies Reviewed: SABRA    EKG Interpretation Date/Time:  Monday November 30 2024 13:47:12 EST Ventricular Rate:  52 PR Interval:  176 QRS Duration:  80 QT Interval:  508 QTC Calculation: 472 R Axis:   49  Text Interpretation: Sinus bradycardia Low voltage QRS Nonspecific ST and T wave abnormality Prolonged QT When compared with ECG of 08-Nov-2024 07:26, Nonspecific T wave abnormality now evident in Inferior leads Inverted T waves have replaced nonspecific T wave abnormality in Anterior leads Confirmed by Parthenia Klinefelter [phone]) on 11/30/2024 2:18:46 PM    Prior CV  Studies:   Cath 11/06/24  Ost LAD to Prox LAD lesion is 99% stenosed.   1.  High-grade edge ISR proximal LAD stent.  Given previous stent failure and proximity to left main, will obtain cardiothoracic surgical consultation. 2.  LVEDP of 28 mmHg.   Recommendation: Restart heparin  infusion 2 hours after TR band has been weaned.  Results reviewed with patient, patient's wife, Cardiology Team A staff, and hospital medicine.  I reviewed the findings with Dr. Kerrin who will see the patient for consideration of surgical revascularization.   Echo 11/06/24  IMPRESSIONS     1. Left ventricular ejection fraction, by estimation, is 60 to 65%. The  left ventricle has normal function. The left ventricle has no regional  wall motion abnormalities. Left ventricular diastolic parameters were  normal.   2. Right ventricular systolic function is normal. The right ventricular  size is normal.   3. The mitral valve is normal in structure. No evidence of mitral valve  regurgitation. No evidence of mitral stenosis.   4. The aortic valve is tricuspid. Aortic valve regurgitation is not  visualized. Aortic valve sclerosis is present, with no evidence of aortic  valve stenosis.   5. The inferior vena cava is normal in size with greater than 50%  respiratory variability, suggesting right atrial pressure of 3 mmHg.  Risk Assessment/Calculations:    CHA2DS2-VASc Score = 4   This indicates a 4.8% annual risk of stroke. The patient's score is based upon: CHF History: 0  HTN History: 1 Diabetes History: 0 Stroke History: 0 Vascular Disease History: 1 Age Score: 2 Gender Score: 0            Physical Exam:   VS:  BP 104/66 (BP Location: Left Arm, Patient Position: Sitting, Cuff Size: Normal)   Pulse (!) 52   Ht 5' 10 (1.778 m)   Wt 157 lb (71.2 kg)   BMI 22.53 kg/m    Orhtostatics: No data found. Wt Readings from Last 3 Encounters:  11/30/24 157 lb (71.2 kg)  11/17/24 163 lb 8 oz (74.2 kg)   11/12/24 163 lb 5.8 oz (74.1 kg)    GEN: Thin, in no acute distress NECK: No JVD; No carotid bruits CARDIAC:  RRR, no murmurs, rubs, gallops RESPIRATORY:  Clear to auscultation without rales, wheezing or rhonchi  ABDOMEN: Soft, non-tender, non-distended EXTREMITIES:  trace edema; No deformity   ASSESSMENT AND PLAN: .    CAD  NSTEMI  with stent restenosis and underwent coronary artery bypass grafting x1 (left internal mammary artery to LAD) on 11/07/2024 with Dr. Kerrin. On ASA and Eliquis . No plavix  Infrarenal abdominal aortic aneurysm (AAA) without rupture followed by Dr. [name]   Thoracic aortic aneurysm 4.4 cm 10/2024   Essential hypertension -BP running low and on midodrine . Off metoprolol  for now. Check bmet and CBC today. Hgb 8.8 at discharge   Paroxysmal atrial fibrillation on Eliquis -no bleeding issues. Also on amiodarone  since CABG. Consider stopping in 2 months. HR 52 today.    DM2 A1C 6.4      Cardiac Rehabilitation Eligibility Assessment  The patient is ready to start cardiac rehabilitation from a cardiac standpoint.       Dispo: f/u Dr. Wonda 2 months.  Signed, Olivia Pavy, PA-C

## 2024-11-19 ENCOUNTER — Other Ambulatory Visit: Payer: Self-pay

## 2024-11-19 MED ORDER — MIDODRINE HCL 2.5 MG PO TABS: 2.5000 mg | ORAL_TABLET | Freq: Three times a day (TID) | ORAL | 0 refills | Status: AC

## 2024-11-19 NOTE — Progress Notes (Signed)
-  Patient called office for hypotension and feeling dizzy/lightheaded -Blood pressure readings consistently 80s/50s at home -Sent in midodrine 2.5 mg TID with meals at this time for hypotension -Continue to stay hydrated and drink plenty of fluids -If hypotension persists and symptoms worsen then he would need to be evaluated at the emergency department

## 2024-11-30 ENCOUNTER — Ambulatory Visit: Attending: Physician Assistant | Admitting: Physician Assistant

## 2024-11-30 ENCOUNTER — Encounter: Payer: Self-pay | Admitting: Physician Assistant

## 2024-11-30 VITALS — BP 104/66 | HR 52 | Ht 70.0 in | Wt 157.0 lb

## 2024-11-30 DIAGNOSIS — I2510 Atherosclerotic heart disease of native coronary artery without angina pectoris: Secondary | ICD-10-CM

## 2024-11-30 DIAGNOSIS — I10 Essential (primary) hypertension: Secondary | ICD-10-CM

## 2024-11-30 DIAGNOSIS — I712 Thoracic aortic aneurysm, without rupture, unspecified: Secondary | ICD-10-CM | POA: Diagnosis not present

## 2024-11-30 DIAGNOSIS — E1159 Type 2 diabetes mellitus with other circulatory complications: Secondary | ICD-10-CM

## 2024-11-30 DIAGNOSIS — I7143 Infrarenal abdominal aortic aneurysm, without rupture: Secondary | ICD-10-CM | POA: Diagnosis not present

## 2024-11-30 DIAGNOSIS — I480 Paroxysmal atrial fibrillation: Secondary | ICD-10-CM

## 2024-11-30 DIAGNOSIS — Z794 Long term (current) use of insulin: Secondary | ICD-10-CM

## 2024-11-30 NOTE — Patient Instructions (Addendum)
 Medication Instructions:  NO CHANGES  Lab Work: NONE BE TO DONE TODAY.  Testing/Procedures: NONE  Follow-Up: At [facility], you and your health needs are our priority.  As part of our continuing mission to provide you with exceptional heart care, our providers are all part of one team.  This team includes your primary Cardiologist (physician) and Advanced Practice Providers or APPs (Physician Assistants and Nurse Practitioners) who all work together to provide you with the care you need, when you need it.  Your next appointment:   2 MONTHS  Provider:   Ozell Fell, MD OR Olivia Pavy, PA

## 2024-12-01 ENCOUNTER — Ambulatory Visit: Payer: Self-pay | Admitting: Physician Assistant

## 2024-12-01 LAB — BASIC METABOLIC PANEL WITH GFR
BUN/Creatinine Ratio: 16 (ref 10–24)
BUN: 24 mg/dL (ref 8–27)
CO2: 21 mmol/L (ref 20–29)
Calcium: 9 mg/dL (ref 8.6–10.2)
Chloride: 104 mmol/L (ref 96–106)
Creatinine, Ser: 1.53 mg/dL — ABNORMAL HIGH (ref 0.76–1.27)
Glucose: 119 mg/dL — ABNORMAL HIGH (ref 70–99)
Potassium: 4 mmol/L (ref 3.5–5.2)
Sodium: 141 mmol/L (ref 134–144)
eGFR: 47 mL/min/1.73 — ABNORMAL LOW (ref >59)

## 2024-12-01 LAB — CBC
Hematocrit: 39.9 % (ref 37.5–51.0)
Hemoglobin: 12.5 g/dL — ABNORMAL LOW (ref 13.0–17.7)
MCH: 30.1 pg (ref 26.6–33.0)
MCHC: 31.3 g/dL — ABNORMAL LOW (ref 31.5–35.7)
MCV: 96 fL (ref 79–97)
Platelets: 307 x10E3/uL (ref 150–450)
RBC: 4.15 x10E6/uL (ref 4.14–5.80)
RDW: 12.1 % (ref 11.6–15.4)
WBC: 6.8 x10E3/uL (ref 3.4–10.8)

## 2024-12-02 NOTE — Telephone Encounter (Signed)
 Patient is returning call from nurse to discuss results

## 2024-12-02 NOTE — Telephone Encounter (Signed)
 Pt returning call

## 2024-12-04 ENCOUNTER — Other Ambulatory Visit: Payer: Self-pay | Admitting: Thoracic Surgery (Cardiothoracic Vascular Surgery)

## 2024-12-04 DIAGNOSIS — Z951 Presence of aortocoronary bypass graft: Secondary | ICD-10-CM

## 2024-12-07 ENCOUNTER — Ambulatory Visit: Payer: Self-pay

## 2024-12-07 ENCOUNTER — Ambulatory Visit (HOSPITAL_COMMUNITY): Admission: RE | Admit: 2024-12-07 | Discharge: 2024-12-07 | Attending: Cardiovascular Disease

## 2024-12-07 VITALS — BP 112/76 | HR 54 | Resp 20 | Ht 70.0 in | Wt 158.0 lb

## 2024-12-07 DIAGNOSIS — Z951 Presence of aortocoronary bypass graft: Secondary | ICD-10-CM

## 2024-12-07 NOTE — Patient Instructions (Signed)

## 2024-12-07 NOTE — Progress Notes (Signed)
 [address] [place] [address]             [phone]       HPI:  Patient returns for routine postoperative follow-up having undergone coronary artery bypass grafting x1 (left internal mammary artery to LAD) on 11/07/2024 with Dr. Kerrin.  The patient's early postoperative recovery while in the hospital was notable for restarting eliquis  for paroxysmal atrial fibrillation. He did develop atrial fibrillation with RVR and was started on IV amiodarone . He converted back to NSR and was transitioned to oral amiodarone . He was stable for discharge home on 11/12/2024.   He presents to the clinic today and reports that he has been doing well.  He was started on midodrine  2.5 mg TID on 11/19/2024 for hypotension.  This has increased his blood pressure but he still at times has the feeling of weakness/legs giving out when attempt to stand quickly.  His incisions have been healing appropriately. He has been active with walking. His pain is well controlled and he has been using tylenol  as needed.  He denies chest pain, shortness of breath, lower leg swelling and dizziness.   Allergies as of 12/07/2024       Reactions   Quinolones Other (See Comments)    Unknown        Medication List        Accurate as of December 07, 2024  1:43 PM. If you have any questions, ask your nurse or doctor.          acetaminophen  325 MG tablet Commonly known as: Tylenol  Take 2 tablets (650 mg total) by mouth every 6 (six) hours as needed.   amiodarone  200 MG tablet Commonly known as: PACERONE  Take 1 tablet twice per day for 7 days, then take 1 tablet daily thereafter   apixaban  5 MG Tabs tablet Commonly known as: Eliquis  Take 1 tablet (5 mg total) by mouth 2 (two) times daily.   atorvastatin  40 MG tablet Commonly known as: LIPITOR Take 40 mg by mouth daily.   empagliflozin  10 MG Tabs tablet Commonly known as: JARDIANCE  Take 10 mg by mouth daily.   FreeStyle  Libre 2 Sensor Misc CHANGE EVERY 14 DAYS TO MONITOR BLOOD GLUCOSE CONTINUOUSLY   insulin  lispro 100 UNIT/ML injection Commonly known as: HUMALOG Inject 6-14 Units into the skin 3 (three) times daily with meals.   Lantus  SoloStar 100 UNIT/ML Solostar Pen Generic drug: insulin  glargine Inject 25 Units into the skin at bedtime.   levothyroxine  100 MCG tablet Commonly known as: SYNTHROID  Take 100 mcg by mouth daily.   Magnesium  500 MG Caps Take 500 mg by mouth daily.   metFORMIN  500 MG tablet Commonly known as: GLUCOPHAGE  Take 500 mg by mouth daily.   midodrine  2.5 MG tablet Commonly known as: PROAMATINE  Take 1 tablet (2.5 mg total) by mouth 3 (three) times daily with meals.   omeprazole 20 MG capsule Commonly known as: PRILOSEC Take 20 mg by mouth daily.   QC TUMERIC COMPLEX PO Take 1 capsule by mouth daily.   traMADol  50 MG tablet Commonly known as: ULTRAM  Take 1 tablet (50 mg total) by mouth every 6 (six) hours as needed for severe pain (pain score 7-10).         ROS  Review of Systems  Constitutional: Negative.  Negative for fever and malaise/fatigue.  Respiratory:  Negative for cough, shortness of breath and wheezing.   Cardiovascular:  Negative for  chest pain and leg swelling.  Neurological:  Negative for dizziness.     BP 112/76   Pulse (!) 54   Resp 20   Ht 5' 10 (1.778 m)   Wt 158 lb (71.7 kg)   SpO2 97% Comment: RA  BMI 22.67 kg/m   Physical Exam Constitutional:      Appearance: Normal appearance.  HENT:     Head: Normocephalic and atraumatic.  Cardiovascular:     Rate and Rhythm: Normal rate and regular rhythm.     Heart sounds: Normal heart sounds, S1 normal and S2 normal.  Pulmonary:     Effort: Pulmonary effort is normal.     Breath sounds: Normal breath sounds.  Skin:    General: Skin is warm and dry.      Neurological:     General: No focal deficit present.     Mental Status: He is alert and oriented to person, place, and time.        Imaging: CLINICAL DATA:  Status post CABG.   EXAM: CHEST - 2 VIEW   COMPARISON:  11/17/2024   FINDINGS: Lungs are hyperexpanded. Left greater than right basilar collapse/consolidation with small bilateral pleural effusions, similar to prior. The cardiopericardial silhouette is within normal limits for size. No acute bony abnormality.   IMPRESSION: Left greater than right basilar collapse/consolidation with small bilateral pleural effusions. No substantial change.     Electronically Signed   By: Camellia Candle M.D.   On: 12/07/2024 12:21     Assessment/Plan:  S/P CABG x 1 -We reviewed today's chest x ray. Small bilateral pleural effusions.  He is currently asymptomatic at this time. He should continue to use his incentive spirometer -Discussed continuation of sternal precautions until a full 6 weeks from surgery (12/24/2024).  Continue to avoid lifting over 10 pounds until a full 3 months from surgery -He is cleared to drive at this time. First time driving should be a short distance in the daytime and he can increase from there -He is cleared from a surgical standpoint to start cardiac rehab -He should continue to increase his activity/exercise as tolerated -Orthostatic hypotension improved with midodrine  2.5 mg TID.  He is to continue to check BP at home. For feeling of weak legs after standing he should pump his legs first before standing.  Then he should stand and wait at least 30 seconds before walking away from his chair. -Continue to stay hydrated and try to drink plenty of fluids -He has been seen by cardiology and he is to continue aspirin . He is to continue eliquis  and amiodarone .  Possibility of discontinuing amiodarone  in 2 months.  He has follow up scheduled for 02/05/2025 with cardiology  -Follow up with [facility] as needed    Manuelita CHRISTELLA Rough, PA-C 1:43 PM 12/07/24

## 2024-12-16 ENCOUNTER — Other Ambulatory Visit (HOSPITAL_COMMUNITY): Payer: Self-pay | Admitting: Anesthesiology

## 2024-12-16 DIAGNOSIS — Z789 Other specified health status: Secondary | ICD-10-CM

## 2024-12-17 LAB — ECHO INTRAOPERATIVE TEE
AR max vel: 3.26 cm2
AV Area VTI: 2.57 cm2
AV Area mean vel: 3.31 cm2
AV Mean grad: 3 mmHg
AV Peak grad: 6.5 mmHg
AV Vena cont: 0.17 cm
Ao pk vel: 1.27 m/s
Area-P 1/2: 3 cm2
Height: 70 in
MV VTI: 2.92 cm2
Weight: 2528 [oz_av]

## 2024-12-28 ENCOUNTER — Other Ambulatory Visit: Payer: Self-pay

## 2024-12-29 ENCOUNTER — Telehealth: Payer: Self-pay | Admitting: Cardiovascular Disease

## 2024-12-29 MED ORDER — MIDODRINE HCL 2.5 MG PO TABS: 2.5000 mg | ORAL_TABLET | Freq: Three times a day (TID) | ORAL | 0 refills | Status: DC

## 2024-12-29 NOTE — Telephone Encounter (Signed)
 Patient identification verified by 2 forms.   Called and spoke to patient's wife  Patient's wife states:  -Pt's wife states they have not been taking his blood pressure daily but he is uneasy when he stands up.  -Spoke with prescribing provider, message below: He is to continue it if his blood pressure is still low. We started it for readings that were 80s/50s so if his blood pressure has increased he can try to stop taking it but if his home readings are still low then he would need to continue   Patient's wife denies:  -Other symptoms   Interventions/Plan: -She will resume monitoring his blood pressure daily.  -Sent 30 pills into preferred pharmacy.   Reviewed ED warning signs/precautions  Patient agrees with plan, no questions at this time

## 2024-12-29 NOTE — Telephone Encounter (Signed)
 Pt c/o medication issue:  1. Name of Medication:  midodrine  (PROAMATINE ) 2.5 MG tablet  2. How are you currently taking this medication (dosage and times per day)? Take 1 tablet (2.5 mg total) by mouth 3 (three) times daily with meals.   3. Are you having a reaction (difficulty breathing--STAT)? No  4. What is your medication issue? PT was put on this medication by Cardiothoracic Doctor and they will not refill. Pt spouse wants to know if PT should be taking this medication.    If it does need to be refilled PT spouse is requesting the prescription be sent to [id number] - [place], [place] - [address] AT [address] [facility] [address]

## 2024-12-30 ENCOUNTER — Telehealth (HOSPITAL_COMMUNITY): Payer: Self-pay

## 2024-12-30 NOTE — Telephone Encounter (Signed)
 Patient's wife called requesting status of referral, said she'd spoken to [place] a couple weeks ago. Informed her that her husband has been cleared for scheduling and after we verify insurance starting Friday we will call to get him scheduled.

## 2025-01-08 ENCOUNTER — Telehealth: Payer: Self-pay | Admitting: Cardiovascular Disease

## 2025-01-08 ENCOUNTER — Telehealth (HOSPITAL_COMMUNITY): Payer: Self-pay

## 2025-01-08 NOTE — Telephone Encounter (Signed)
 Spoke with wife, states she just got off the phone with someone regarding this and she would just wait for rehab to reach out to them.

## 2025-01-08 NOTE — Telephone Encounter (Signed)
 Pt's wife would like a c/b regarding Cardiac Rehab referral. Wife states that she has called several times to try and get pt scheduled being that she has not heard from their office. She has not had any success and would like to know if nurse if able to call. Please advise

## 2025-01-08 NOTE — Telephone Encounter (Signed)
 Called patient to see if he was interested in participating in the Cardiac Rehab Program. Patient will come in for orientation on 1/20 and will attend the 8:15 exercise class.  Pensions consultant.

## 2025-01-08 NOTE — Telephone Encounter (Signed)
 Called [facility]. Pt insurance is active and benefits verified through [facility] Medicare Co-pay 0, DED 0/0 met, out of pocket 4200.00/0 met, co-insurance 0. All severices covered at 100%. No pre-authorization required. Rep Name Marland HERO 01/25/2025@10 :66, REF# [id number]   TCR/ICR? ICR Visit(date of service)limitation? No Can multiple codes be used on the same date of service/visit?(IF ITS A LIMIT)          Is this a lifetime maximum or an annual maximum? 0 Has the member used any of these services to date? 0 Is there a time limit (weeks/months) on start of program and/or program completion? No

## 2025-01-14 ENCOUNTER — Other Ambulatory Visit: Payer: Self-pay | Admitting: Physician Assistant

## 2025-01-15 ENCOUNTER — Telehealth (HOSPITAL_COMMUNITY): Payer: Self-pay

## 2025-01-15 NOTE — Telephone Encounter (Signed)
 Attempted to confirm cardiac rehab orientation date of 01/19/25 @ 0800.  Location, what to bring, footwear, and approximate length of orientation given in message.

## 2025-01-18 ENCOUNTER — Encounter (HOSPITAL_COMMUNITY): Payer: Self-pay

## 2025-01-18 ENCOUNTER — Telehealth (HOSPITAL_COMMUNITY): Payer: Self-pay

## 2025-01-18 ENCOUNTER — Telehealth: Payer: Self-pay | Admitting: Cardiovascular Disease

## 2025-01-18 NOTE — Telephone Encounter (Signed)
 Returned phone call regarding patient medications. Patient inquiring about coming off his Midodrine  and Amiodarone . States he was put on Midodrine  in the hospital and has been having normal blood pressures. Pt did not have any BP readings at this time. Discussed at LOV possibly coming off Amiodarone  in a few months. Will send to Olivia Pavy, PA-C and Dr Wonda for further clarification or recommendations. Pt verbalizes understanding of plan.

## 2025-01-18 NOTE — Telephone Encounter (Signed)
 Confirmed cardiac orientation appointment time of 01/19/25 at 0800.  Cardiac health history completed.

## 2025-01-18 NOTE — Telephone Encounter (Signed)
 Pt c/o medication issue:  1. Name of Medication:   amiodarone  (PACERONE ) 200 MG tablet   midodrine  (PROAMATINE ) 2.5 MG tablet   2. How are you currently taking this medication (dosage and times per day)? As written   3. Are you having a reaction (difficulty breathing--STAT)? No  4. What is your medication issue? Patient wife would like to know if these medications are still necessary for patient to take. Please advise.

## 2025-01-19 ENCOUNTER — Telehealth (HOSPITAL_COMMUNITY): Payer: Self-pay | Admitting: *Deleted

## 2025-01-19 ENCOUNTER — Encounter (HOSPITAL_COMMUNITY)
Admission: RE | Admit: 2025-01-19 | Discharge: 2025-01-19 | Disposition: A | Discharge status: Home / Self Care | Source: Ambulatory Visit | Attending: Cardiovascular Disease | Admitting: Cardiovascular Disease

## 2025-01-19 VITALS — BP 148/82 | HR 53 | Ht 69.25 in | Wt 163.4 lb

## 2025-01-19 DIAGNOSIS — Z48812 Encounter for surgical aftercare following surgery on the circulatory system: Secondary | ICD-10-CM | POA: Diagnosis not present

## 2025-01-19 DIAGNOSIS — Z951 Presence of aortocoronary bypass graft: Secondary | ICD-10-CM | POA: Insufficient documentation

## 2025-01-19 DIAGNOSIS — I214 Non-ST elevation (NSTEMI) myocardial infarction: Secondary | ICD-10-CM

## 2025-01-19 NOTE — Progress Notes (Signed)
 Cardiac Rehab Medication Review   Does the patient  feel that his/her medications are working for him/her?  yes  Has the patient been experiencing any side effects to the medications prescribed?  no  Does the patient measure his/her own blood pressure or blood glucose at home?  Yes, he has a CGM to monitor his BS continuously. Checks BP once per week.  Does the patient have any problems obtaining medications due to transportation or finances?   no  Understanding of regimen: fair Understanding of indications: fair, no per patient, he doesn't know what each medication he's taking is for. Potential of compliance: excellent    Comments: Patient medication list from home is different from what is in his EMR. Patient will bring his medication bottles to his first exercise session for review with the nurse case manager for his class.    Frederick Miles 01/19/2025 9:52 AM

## 2025-01-19 NOTE — Progress Notes (Signed)
 Cardiac Individual Treatment Plan  Patient Details  Name: Frederick Miles MRN: [id number] Date of Birth: May 15, 1946 Referring Provider:   Conrad Ports INTENSIVE CARDIAC REHAB ORIENT from 01/19/2025 in [facility]  Referring Provider Wonda Sharper, MD    Initial Encounter Date:  Flowsheet Row INTENSIVE CARDIAC REHAB ORIENT from 01/19/2025 in 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  11/05/24 NSTEMI  Patient's Home Medications on Admission: Current Medications[1]  Past Medical History: Past Medical History:  Diagnosis Date   AAA (abdominal aortic aneurysm) without rupture 04/28/2017   Aorta US  1/18: 4.2 cm AAA   Bladder cancer [facility]) 2006   Cervicalgia    Coronary atherosclerosis of native coronary artery    stent x1   Diabetes mellitus without complication (HCC)    typ2   Esophageal reflux    Esophageal stricture    Heart murmur    slight   History of cardiovascular stress test    Lexiscan  Myoview  7/16: EF 66%, diaphragmatic attenuation, no ischemia, low risk   History of kidney stones    Hx of adenomatous colonic polyps    Hyperlipidemia    Hypertension    Myocardial infarct [facility])    2007   Unspecified hypothyroidism     Tobacco Use: Tobacco Use History[2]  Labs: Review Flowsheet  More data may exist      Latest Ref Rng & Units 12/10/2007 03/03/2008 01/02/2019 11/06/2024 11/07/2024  Labs for ITP Cardiac and Pulmonary Rehab  Cholestrol 0 - 200 mg/dL 91  876  - 92  -  LDL (calc) 0 - 99 mg/dL 51  65  - 42  -  HDL-C >40 mg/dL 78.5  72.3  - 35  -  Trlycerides <150 mg/dL 93  849  - 75  -  Hemoglobin A1c 4.8 - 5.6 % - - 6.4  6.4  -  PH, Arterial 7.35 - 7.45 - - - 7.44  7.402  7.380  7.301  7.283  7.379  7.341   PCO2 arterial 32 - 48 mmHg - - - 40  30.0  31.9  39.7  45.8  35.0  39.8   Bicarbonate 20.0 - 28.0 mmol/L - - - 26.5   18.5  18.8  19.6  21.7  22.5  20.6  21.5   TCO2 22 - 32 mmol/L - - - - 19  20  21  23  23  23  24  22  24  21  23    Acid-base deficit 0.0 - 2.0 mmol/L - - - - 5.0  6.0  6.0  5.0  3.0  4.0  4.0   O2 Saturation % - - - 100  99  100  100  100  69  100  100     Details       Multiple values from one day are sorted in reverse-chronological order         Capillary Blood Glucose: Lab Results  Component Value Date   GLUCAP 121 (H) 11/12/2024   GLUCAP 64 (L) 11/12/2024   GLUCAP 73 11/12/2024   GLUCAP 54 (L) 11/11/2024   GLUCAP 68 (L) 11/11/2024     Exercise Target Goals: Exercise Program Goal: Individual exercise prescription set using results from initial 6 min walk test and THRR while considering  patients activity barriers and safety.   Exercise Prescription Goal: Initial exercise  prescription builds to 30-45 minutes a day of aerobic activity, 2-3 days per week.  Home exercise guidelines will be given to patient during program as part of exercise prescription that the participant will acknowledge.  Activity Barriers & Risk Stratification:  Activity Barriers & Cardiac Risk Stratification - 01/19/25 0851       Activity Barriers & Cardiac Risk Stratification   Activity Barriers Other (comment)    Comments Occasional right hip pain with walking.    Cardiac Risk Stratification High          6 Minute Walk:  6 Minute Walk     Row Name 01/19/25 1032         6 Minute Walk   Phase Initial     Distance 1703 feet     Walk Time 6 minutes     # of Rest Breaks 0     MPH 3.22     METS 3.07     RPE 9     Perceived Dyspnea  0     VO2 Peak 10.75     Symptoms No     Resting HR 53 bpm     Resting BP 148/82     Resting Oxygen Saturation  100 %     Exercise Oxygen Saturation  during 6 min walk 100 %     Max Ex. HR 68 bpm     Max Ex. BP 112/50     2 Minute Post BP 128/66        Oxygen Initial Assessment:   Oxygen Re-Evaluation:   Oxygen Discharge (Final Oxygen  Re-Evaluation):   Initial Exercise Prescription:  Initial Exercise Prescription - 01/19/25 1200       Date of Initial Exercise RX and Referring Provider   Date 01/19/25    Referring Provider Wonda Sharper, MD    Expected Discharge Date 04/14/25      Recumbant Elliptical   Level 1    RPM 25    Watts 25    Minutes 15    METs 3      Track   Laps 15    Minutes 15    METs 2.91      Prescription Details   Frequency (times per week) 3    Duration Progress to 30 minutes of continuous aerobic without signs/symptoms of physical distress      Intensity   THRR 40-80% of Max Heartrate 57-114    Ratings of Perceived Exertion 11-13    Perceived Dyspnea 0-4      Progression   Progression Continue to progress workloads to maintain intensity without signs/symptoms of physical distress.      Resistance Training   Training Prescription Yes    Weight 2 lbs    Reps 10-15          Perform Capillary Blood Glucose checks as needed.  Exercise Prescription Changes:   Exercise Comments:   Exercise Goals and Review:   Exercise Goals     Row Name 01/19/25 0916             Exercise Goals   Increase Physical Activity Yes       Intervention Provide advice, education, support and counseling about physical activity/exercise needs.;Develop an individualized exercise prescription for aerobic and resistive training based on initial evaluation findings, risk stratification, comorbidities and participant's personal goals.       Expected Outcomes Short Term: Attend rehab on a regular basis to increase amount of physical activity.;Long Term: Add in home exercise  to make exercise part of routine and to increase amount of physical activity.;Long Term: Exercising regularly at least 3-5 days a week.       Increase Strength and Stamina Yes       Intervention Provide advice, education, support and counseling about physical activity/exercise needs.;Develop an individualized exercise prescription  for aerobic and resistive training based on initial evaluation findings, risk stratification, comorbidities and participant's personal goals.       Expected Outcomes Short Term: Increase workloads from initial exercise prescription for resistance, speed, and METs.;Short Term: Perform resistance training exercises routinely during rehab and add in resistance training at home;Long Term: Improve cardiorespiratory fitness, muscular endurance and strength as measured by increased METs and functional capacity ( )       Able to understand and use rate of perceived exertion (RPE) scale Yes       Intervention Provide education and explanation on how to use RPE scale       Expected Outcomes Short Term: Able to use RPE daily in rehab to express subjective intensity level;Long Term:  Able to use RPE to guide intensity level when exercising independently       Knowledge and understanding of Target Heart Rate Range (THRR) Yes       Intervention Provide education and explanation of THRR including how the numbers were predicted and where they are located for reference       Expected Outcomes Short Term: Able to state/look up THRR;Long Term: Able to use THRR to govern intensity when exercising independently;Short Term: Able to use daily as guideline for intensity in rehab       Able to check pulse independently Yes       Intervention Provide education and demonstration on how to check pulse in carotid and radial arteries.;Review the importance of being able to check your own pulse for safety during independent exercise       Expected Outcomes Short Term: Able to explain why pulse checking is important during independent exercise;Long Term: Able to check pulse independently and accurately       Understanding of Exercise Prescription Yes       Intervention Provide education, explanation, and written materials on patient's individual exercise prescription       Expected Outcomes Short Term: Able to explain program  exercise prescription;Long Term: Able to explain home exercise prescription to exercise independently          Exercise Goals Re-Evaluation :   Discharge Exercise Prescription (Final Exercise Prescription Changes):   Nutrition:  Target Goals: Understanding of nutrition guidelines, daily intake of sodium 1500mg , cholesterol 200mg , calories 30% from fat and 7% or less from saturated fats, daily to have 5 or more servings of fruits and vegetables.  Biometrics:  Pre Biometrics - 01/19/25 0835       Pre Biometrics   Waist Circumference 36 inches    Hip Circumference 39 inches    Waist to Hip Ratio 0.92 %    Triceps Skinfold 6 mm    % Body Fat 20.9 %    Grip Strength 21 kg    Flexibility 13.75 in    Single Leg Stand 3 seconds           Nutrition Therapy Plan and Nutrition Goals:   Nutrition Assessments:  MEDIFICTS Score Key: >=70 Need to make dietary changes  40-70 Heart Healthy Diet <= 40 Therapeutic Level Cholesterol Diet    Picture Your Plate Scores: <59 Unhealthy dietary pattern with much room for improvement. 41-50  Dietary pattern unlikely to meet recommendations for good health and room for improvement. 51-60 More healthful dietary pattern, with some room for improvement.  >60 Healthy dietary pattern, although there may be some specific behaviors that could be improved.    Nutrition Goals Re-Evaluation:   Nutrition Goals Re-Evaluation:   Nutrition Goals Discharge (Final Nutrition Goals Re-Evaluation):   Psychosocial: Target Goals: Acknowledge presence or absence of significant depression and/or stress, maximize coping skills, provide positive support system. Participant is able to verbalize types and ability to use techniques and skills needed for reducing stress and depression.  Initial Review & Psychosocial Screening:  Initial Psych Review & Screening - 01/19/25 0946       Initial Review   Current issues with None Identified      Family  Dynamics   Good Support System? Yes    Comments Great support of his wife.      Barriers   Psychosocial barriers to participate in program There are no identifiable barriers or psychosocial needs.      Screening Interventions   Interventions Encouraged to exercise    Expected Outcomes Short Term goal: Identification and review with participant of any Quality of Life or Depression concerns found by scoring the questionnaire.;Long Term goal: The participant improves quality of Life and PHQ9 Scores as seen by post scores and/or verbalization of changes          Quality of Life Scores:  Quality of Life - 01/19/25 1408       Quality of Life   Select Quality of Life      Quality of Life Scores   Health/Function Pre 30 %    Socioeconomic Pre 29.14 %    Psych/Spiritual Pre 30 %    Family Pre 30 %    GLOBAL Pre 29.82 %         Scores of 19 and below usually indicate a poorer quality of life in these areas.  A difference of  2-3 points is a clinically meaningful difference.  A difference of 2-3 points in the total score of the Quality of Life Index has been associated with significant improvement in overall quality of life, self-image, physical symptoms, and general health in studies assessing change in quality of life.  PHQ-9: Review Flowsheet       01/19/2025  Depression screen PHQ 2/9  Decreased Interest 0  Down, Depressed, Hopeless 0  PHQ - 2 Score 0  Altered sleeping 0  Tired, decreased energy 0  Change in appetite 0  Feeling bad or failure about yourself  0  Trouble concentrating 0  Moving slowly or fidgety/restless 0  Suicidal thoughts 0  PHQ-9 Score 0   Interpretation of Total Score  Total Score Depression Severity:  1-4 = Minimal depression, 5-9 = Mild depression, 10-14 = Moderate depression, 15-19 = Moderately severe depression, 20-27 = Severe depression   Psychosocial Evaluation and Intervention:   Psychosocial Re-Evaluation:   Psychosocial Discharge  (Final Psychosocial Re-Evaluation):   Vocational Rehabilitation: Provide vocational rehab assistance to qualifying candidates.   Vocational Rehab Evaluation & Intervention:  Vocational Rehab - 01/19/25 0943       Initial Vocational Rehab Evaluation & Intervention   Assessment shows need for Vocational Rehabilitation Yes    Vocational Rehab Packet given to patient 01/19/25      Vocational Rehab Re-Evaulation   Comments Blanca is interested in VR. Packet given. He will review and return if he would like to pursue.  Education: Education Goals: Education classes will be provided on a weekly basis, covering required topics. Participant will state understanding/return demonstration of topics presented.     Core Videos: Exercise    Move It!  Clinical staff conducted group or individual video education with verbal and written material and guidebook.  Patient learns the recommended Pritikin exercise program. Exercise with the goal of living a long, healthy life. Some of the health benefits of exercise include controlled diabetes, healthier blood pressure levels, improved cholesterol levels, improved heart and lung capacity, improved sleep, and better body composition. Everyone should speak with their doctor before starting or changing an exercise routine.  Biomechanical Limitations Clinical staff conducted group or individual video education with verbal and written material and guidebook.  Patient learns how biomechanical limitations can impact exercise and how we can mitigate and possibly overcome limitations to have an impactful and balanced exercise routine.  Body Composition Clinical staff conducted group or individual video education with verbal and written material and guidebook.  Patient learns that body composition (ratio of muscle mass to fat mass) is a key component to assessing overall fitness, rather than body weight alone. Increased fat mass, especially visceral  belly fat, can put us  at increased risk for metabolic syndrome, type 2 diabetes, heart disease, and even death. It is recommended to combine diet and exercise (cardiovascular and resistance training) to improve your body composition. Seek guidance from your physician and exercise physiologist before implementing an exercise routine.  Exercise Action Plan Clinical staff conducted group or individual video education with verbal and written material and guidebook.  Patient learns the recommended strategies to achieve and enjoy long-term exercise adherence, including variety, self-motivation, self-efficacy, and positive decision making. Benefits of exercise include fitness, good health, weight management, more energy, better sleep, less stress, and overall well-being.  Medical   Heart Disease Risk Reduction Clinical staff conducted group or individual video education with verbal and written material and guidebook.  Patient learns our heart is our most vital organ as it circulates oxygen, nutrients, white blood cells, and hormones throughout the entire body, and carries waste away. Data supports a plant-based eating plan like the Pritikin Program for its effectiveness in slowing progression of and reversing heart disease. The video provides a number of recommendations to address heart disease.   Metabolic Syndrome and Belly Fat  Clinical staff conducted group or individual video education with verbal and written material and guidebook.  Patient learns what metabolic syndrome is, how it leads to heart disease, and how one can reverse it and keep it from coming back. You have metabolic syndrome if you have 3 of the following 5 criteria: abdominal obesity, high blood pressure, high triglycerides, low HDL cholesterol, and high blood sugar.  Hypertension and Heart Disease Clinical staff conducted group or individual video education with verbal and written material and guidebook.  Patient learns that high  blood pressure, or hypertension, is very common in the [place] . Hypertension is largely due to excessive salt intake, but other important risk factors include being overweight, physical inactivity, drinking too much alcohol, smoking, and not eating enough potassium from fruits and vegetables. High blood pressure is a leading risk factor for heart attack, stroke, congestive heart failure, dementia, kidney failure, and premature death. Long-term effects of excessive salt intake include stiffening of the arteries and thickening of heart muscle and organ damage. Recommendations include ways to reduce hypertension and the risk of heart disease.  Diseases of Our Time - Focusing on Diabetes Clinical  staff conducted group or individual video education with verbal and written material and guidebook.  Patient learns why the best way to stop diseases of our time is prevention, through food and other lifestyle changes. Medicine (such as prescription pills and surgeries) is often only a Band-Aid on the problem, not a long-term solution. Most common diseases of our time include obesity, type 2 diabetes, hypertension, heart disease, and cancer. The Pritikin Program is recommended and has been proven to help reduce, reverse, and/or prevent the damaging effects of metabolic syndrome.  Nutrition   Overview of the Pritikin Eating Plan  Clinical staff conducted group or individual video education with verbal and written material and guidebook.  Patient learns about the Pritikin Eating Plan for disease risk reduction. The Pritikin Eating Plan emphasizes a wide variety of unrefined, minimally-processed carbohydrates, like fruits, vegetables, whole grains, and legumes. Go, Caution, and Stop food choices are explained. Plant-based and lean animal proteins are emphasized. Rationale provided for low sodium intake for blood pressure control, low added sugars for blood sugar stabilization, and low added fats and oils for  coronary artery disease risk reduction and weight management.  Calorie Density  Clinical staff conducted group or individual video education with verbal and written material and guidebook.  Patient learns about calorie density and how it impacts the Pritikin Eating Plan. Knowing the characteristics of the food you choose will help you decide whether those foods will lead to weight gain or weight loss, and whether you want to consume more or less of them. Weight loss is usually a side effect of the Pritikin Eating Plan because of its focus on low calorie-dense foods.  Label Reading  Clinical staff conducted group or individual video education with verbal and written material and guidebook.  Patient learns about the Pritikin recommended label reading guidelines and corresponding recommendations regarding calorie density, added sugars, sodium content, and whole grains.  Dining Out - Part 1  Clinical staff conducted group or individual video education with verbal and written material and guidebook.  Patient learns that restaurant meals can be sabotaging because they can be so high in calories, fat, sodium, and/or sugar. Patient learns recommended strategies on how to positively address this and avoid unhealthy pitfalls.  Facts on Fats  Clinical staff conducted group or individual video education with verbal and written material and guidebook.  Patient learns that lifestyle modifications can be just as effective, if not more so, as many medications for lowering your risk of heart disease. A Pritikin lifestyle can help to reduce your risk of inflammation and atherosclerosis (cholesterol build-up, or plaque, in the artery walls). Lifestyle interventions such as dietary choices and physical activity address the cause of atherosclerosis. A review of the types of fats and their impact on blood cholesterol levels, along with dietary recommendations to reduce fat intake is also included.  Nutrition Action Plan   Clinical staff conducted group or individual video education with verbal and written material and guidebook.  Patient learns how to incorporate Pritikin recommendations into their lifestyle. Recommendations include planning and keeping personal health goals in mind as an important part of their success.  Healthy Mind-Set    Healthy Minds, Bodies, Hearts  Clinical staff conducted group or individual video education with verbal and written material and guidebook.  Patient learns how to identify when they are stressed. Video will discuss the impact of that stress, as well as the many benefits of stress management. Patient will also be introduced to stress management techniques. The way  we think, act, and feel has an impact on our hearts.  How Our Thoughts Can Heal Our Hearts  Clinical staff conducted group or individual video education with verbal and written material and guidebook.  Patient learns that negative thoughts can cause depression and anxiety. This can result in negative lifestyle behavior and serious health problems. Cognitive behavioral therapy is an effective method to help control our thoughts in order to change and improve our emotional outlook.  Additional Videos:  Exercise    Improving Performance  Clinical staff conducted group or individual video education with verbal and written material and guidebook.  Patient learns to use a non-linear approach by alternating intensity levels and lengths of time spent exercising to help burn more calories and lose more body fat. Cardiovascular exercise helps improve heart health, metabolism, hormonal balance, blood sugar control, and recovery from fatigue. Resistance training improves strength, endurance, balance, coordination, reaction time, metabolism, and muscle mass. Flexibility exercise improves circulation, posture, and balance. Seek guidance from your physician and exercise physiologist before implementing an exercise routine and learn  your capabilities and proper form for all exercise.  Introduction to Yoga  Clinical staff conducted group or individual video education with verbal and written material and guidebook.  Patient learns about yoga, a discipline of the coming together of mind, breath, and body. The benefits of yoga include improved flexibility, improved range of motion, better posture and core strength, increased lung function, weight loss, and positive self-image. Yogas heart health benefits include lowered blood pressure, healthier heart rate, decreased cholesterol and triglyceride levels, improved immune function, and reduced stress. Seek guidance from your physician and exercise physiologist before implementing an exercise routine and learn your capabilities and proper form for all exercise.  Medical   Aging: Enhancing Your Quality of Life  Clinical staff conducted group or individual video education with verbal and written material and guidebook.  Patient learns key strategies and recommendations to stay in good physical health and enhance quality of life, such as prevention strategies, having an advocate, securing a Health Care Proxy and Power of Attorney, and keeping a list of medications and system for tracking them. It also discusses how to avoid risk for bone loss.  Biology of Weight Control  Clinical staff conducted group or individual video education with verbal and written material and guidebook.  Patient learns that weight gain occurs because we consume more calories than we burn (eating more, moving less). Even if your body weight is normal, you may have higher ratios of fat compared to muscle mass. Too much body fat puts you at increased risk for cardiovascular disease, heart attack, stroke, type 2 diabetes, and obesity-related cancers. In addition to exercise, following the Pritikin Eating Plan can help reduce your risk.  Decoding Lab Results  Clinical staff conducted group or individual video education  with verbal and written material and guidebook.  Patient learns that lab test reflects one measurement whose values change over time and are influenced by many factors, including medication, stress, sleep, exercise, food, hydration, pre-existing medical conditions, and more. It is recommended to use the knowledge from this video to become more involved with your lab results and evaluate your numbers to speak with your doctor.   Diseases of Our Time - Overview  Clinical staff conducted group or individual video education with verbal and written material and guidebook.  Patient learns that according to the CDC, 50% to 70% of chronic diseases (such as obesity, type 2 diabetes, elevated lipids, hypertension, and heart  disease) are avoidable through lifestyle improvements including healthier food choices, listening to satiety cues, and increased physical activity.  Sleep Disorders Clinical staff conducted group or individual video education with verbal and written material and guidebook.  Patient learns how good quality and duration of sleep are important to overall health and well-being. Patient also learns about sleep disorders and how they impact health along with recommendations to address them, including discussing with a physician.  Nutrition  Dining Out - Part 2 Clinical staff conducted group or individual video education with verbal and written material and guidebook.  Patient learns how to plan ahead and communicate in order to maximize their dining experience in a healthy and nutritious manner. Included are recommended food choices based on the type of restaurant the patient is visiting.   Fueling a Banker conducted group or individual video education with verbal and written material and guidebook.  There is a strong connection between our food choices and our health. Diseases like obesity and type 2 diabetes are very prevalent and are in large-part due to lifestyle  choices. The Pritikin Eating Plan provides plenty of food and hunger-curbing satisfaction. It is easy to follow, affordable, and helps reduce health risks.  Menu Workshop  Clinical staff conducted group or individual video education with verbal and written material and guidebook.  Patient learns that restaurant meals can sabotage health goals because they are often packed with calories, fat, sodium, and sugar. Recommendations include strategies to plan ahead and to communicate with the manager, chef, or server to help order a healthier meal.  Planning Your Eating Strategy  Clinical staff conducted group or individual video education with verbal and written material and guidebook.  Patient learns about the Pritikin Eating Plan and its benefit of reducing the risk of disease. The Pritikin Eating Plan does not focus on calories. Instead, it emphasizes high-quality, nutrient-rich foods. By knowing the characteristics of the foods, we choose, we can determine their calorie density and make informed decisions.  Targeting Your Nutrition Priorities  Clinical staff conducted group or individual video education with verbal and written material and guidebook.  Patient learns that lifestyle habits have a tremendous impact on disease risk and progression. This video provides eating and physical activity recommendations based on your personal health goals, such as reducing LDL cholesterol, losing weight, preventing or controlling type 2 diabetes, and reducing high blood pressure.  Vitamins and Minerals  Clinical staff conducted group or individual video education with verbal and written material and guidebook.  Patient learns different ways to obtain key vitamins and minerals, including through a recommended healthy diet. It is important to discuss all supplements you take with your doctor.   Healthy Mind-Set    Smoking Cessation  Clinical staff conducted group or individual video education with verbal and  written material and guidebook.  Patient learns that cigarette smoking and tobacco addiction pose a serious health risk which affects millions of people. Stopping smoking will significantly reduce the risk of heart disease, lung disease, and many forms of cancer. Recommended strategies for quitting are covered, including working with your doctor to develop a successful plan.  Culinary   Becoming a Set Designer conducted group or individual video education with verbal and written material and guidebook.  Patient learns that cooking at home can be healthy, cost-effective, quick, and puts them in control. Keys to cooking healthy recipes will include looking at your recipe, assessing your equipment needs, planning ahead, making it simple,  choosing cost-effective seasonal ingredients, and limiting the use of added fats, salts, and sugars.  Cooking - Breakfast and Snacks  Clinical staff conducted group or individual video education with verbal and written material and guidebook.  Patient learns how important breakfast is to satiety and nutrition through the entire day. Recommendations include key foods to eat during breakfast to help stabilize blood sugar levels and to prevent overeating at meals later in the day. Planning ahead is also a key component.  Cooking - Educational Psychologist conducted group or individual video education with verbal and written material and guidebook.  Patient learns eating strategies to improve overall health, including an approach to cook more at home. Recommendations include thinking of animal protein as a side on your plate rather than center stage and focusing instead on lower calorie dense options like vegetables, fruits, whole grains, and plant-based proteins, such as beans. Making sauces in large quantities to freeze for later and leaving the skin on your vegetables are also recommended to maximize your experience.  Cooking - Healthy Salads and  Dressing Clinical staff conducted group or individual video education with verbal and written material and guidebook.  Patient learns that vegetables, fruits, whole grains, and legumes are the foundations of the Pritikin Eating Plan. Recommendations include how to incorporate each of these in flavorful and healthy salads, and how to create homemade salad dressings. Proper handling of ingredients is also covered. Cooking - Soups and [employer] - Soups and Desserts Clinical staff conducted group or individual video education with verbal and written material and guidebook.  Patient learns that Pritikin soups and desserts make for easy, nutritious, and delicious snacks and meal components that are low in sodium, fat, sugar, and calorie density, while high in vitamins, minerals, and filling fiber. Recommendations include simple and healthy ideas for soups and desserts.   Overview     The Pritikin Solution Program Overview Clinical staff conducted group or individual video education with verbal and written material and guidebook.  Patient learns that the results of the Pritikin Program have been documented in more than 100 articles published in peer-reviewed journals, and the benefits include reducing risk factors for (and, in some cases, even reversing) high cholesterol, high blood pressure, type 2 diabetes, obesity, and more! An overview of the three key pillars of the Pritikin Program will be covered: eating well, doing regular exercise, and having a healthy mind-set.  WORKSHOPS  Exercise: Exercise Basics: Building Your Action Plan Clinical staff led group instruction and group discussion with PowerPoint presentation and patient guidebook. To enhance the learning environment the use of posters, models and videos may be added. At the conclusion of this workshop, patients will comprehend the difference between physical activity and exercise, as well as the benefits of incorporating both, into  their routine. Patients will understand the FITT (Frequency, Intensity, Time, and Type) principle and how to use it to build an exercise action plan. In addition, safety concerns and other considerations for exercise and cardiac rehab will be addressed by the presenter. The purpose of this lesson is to promote a comprehensive and effective weekly exercise routine in order to improve patients overall level of fitness.   Managing Heart Disease: Your Path to a Healthier Heart Clinical staff led group instruction and group discussion with PowerPoint presentation and patient guidebook. To enhance the learning environment the use of posters, models and videos may be added.At the conclusion of this workshop, patients will understand the anatomy and physiology  of the heart. Additionally, they will understand how Pritikins three pillars impact the risk factors, the progression, and the management of heart disease.  The purpose of this lesson is to provide a high-level overview of the heart, heart disease, and how the Pritikin lifestyle positively impacts risk factors.  Exercise Biomechanics Clinical staff led group instruction and group discussion with PowerPoint presentation and patient guidebook. To enhance the learning environment the use of posters, models and videos may be added. Patients will learn how the structural parts of their bodies function and how these functions impact their daily activities, movement, and exercise. Patients will learn how to promote a neutral spine, learn how to manage pain, and identify ways to improve their physical movement in order to promote healthy living. The purpose of this lesson is to expose patients to common physical limitations that impact physical activity. Participants will learn practical ways to adapt and manage aches and pains, and to minimize their effect on regular exercise. Patients will learn how to maintain good posture while sitting, walking, and  lifting.  Balance Training and Fall Prevention  Clinical staff led group instruction and group discussion with PowerPoint presentation and patient guidebook. To enhance the learning environment the use of posters, models and videos may be added. At the conclusion of this workshop, patients will understand the importance of their sensorimotor skills (vision, proprioception, and the vestibular system) in maintaining their ability to balance as they age. Patients will apply a variety of balancing exercises that are appropriate for their current level of function. Patients will understand the common causes for poor balance, possible solutions to these problems, and ways to modify their physical environment in order to minimize their fall risk. The purpose of this lesson is to teach patients about the importance of maintaining balance as they age and ways to minimize their risk of falling.  WORKSHOPS   Nutrition:  Fueling a Ship Broker led group instruction and group discussion with PowerPoint presentation and patient guidebook. To enhance the learning environment the use of posters, models and videos may be added. Patients will review the foundational principles of the Pritikin Eating Plan and understand what constitutes a serving size in each of the food groups. Patients will also learn Pritikin-friendly foods that are better choices when away from home and review make-ahead meal and snack options. Calorie density will be reviewed and applied to three nutrition priorities: weight maintenance, weight loss, and weight gain. The purpose of this lesson is to reinforce (in a group setting) the key concepts around what patients are recommended to eat and how to apply these guidelines when away from home by planning and selecting Pritikin-friendly options. Patients will understand how calorie density may be adjusted for different weight management goals.  Mindful Eating  Clinical staff led  group instruction and group discussion with PowerPoint presentation and patient guidebook. To enhance the learning environment the use of posters, models and videos may be added. Patients will briefly review the concepts of the Pritikin Eating Plan and the importance of low-calorie dense foods. The concept of mindful eating will be introduced as well as the importance of paying attention to internal hunger signals. Triggers for non-hunger eating and techniques for dealing with triggers will be explored. The purpose of this lesson is to provide patients with the opportunity to review the basic principles of the Pritikin Eating Plan, discuss the value of eating mindfully and how to measure internal cues of hunger and fullness using the Hunger Scale.  Patients will also discuss reasons for non-hunger eating and learn strategies to use for controlling emotional eating.  Targeting Your Nutrition Priorities Clinical staff led group instruction and group discussion with PowerPoint presentation and patient guidebook. To enhance the learning environment the use of posters, models and videos may be added. Patients will learn how to determine their genetic susceptibility to disease by reviewing their family history. Patients will gain insight into the importance of diet as part of an overall healthy lifestyle in mitigating the impact of genetics and other environmental insults. The purpose of this lesson is to provide patients with the opportunity to assess their personal nutrition priorities by looking at their family history, their own health history and current risk factors. Patients will also be able to discuss ways of prioritizing and modifying the Pritikin Eating Plan for their highest risk areas  Menu  Clinical staff led group instruction and group discussion with PowerPoint presentation and patient guidebook. To enhance the learning environment the use of posters, models and videos may be added. Using menus  brought in from [employer], or printed from [employer], patients will apply the Pritikin dining out guidelines that were presented in the [employer] video. Patients will also be able to practice these guidelines in a variety of provided scenarios. The purpose of this lesson is to provide patients with the opportunity to practice hands-on learning of the Pritikin Dining Out guidelines with actual menus and practice scenarios.  Label Reading Clinical staff led group instruction and group discussion with PowerPoint presentation and patient guidebook. To enhance the learning environment the use of posters, models and videos may be added. Patients will review and discuss the Pritikin label reading guidelines presented in Pritikins Label Reading Educational series video. Using fool labels brought in from local grocery stores and markets, patients will apply the label reading guidelines and determine if the packaged food meet the Pritikin guidelines. The purpose of this lesson is to provide patients with the opportunity to review, discuss, and practice hands-on learning of the Pritikin Label Reading guidelines with actual packaged food labels. Cooking School  Pritikins [employer] are designed to teach patients ways to prepare quick, simple, and affordable recipes at home. The importance of nutritions role in chronic disease risk reduction is reflected in its emphasis in the overall Pritikin program. By learning how to prepare essential core Pritikin Eating Plan recipes, patients will increase control over what they eat; be able to customize the flavor of foods without the use of added salt, sugar, or fat; and improve the quality of the food they consume. By learning a set of core recipes which are easily assembled, quickly prepared, and affordable, patients are more likely to prepare more healthy foods at home. These workshops focus on convenient breakfasts, simple  entres, side dishes, and desserts which can be prepared with minimal effort and are consistent with nutrition recommendations for cardiovascular risk reduction. Cooking [employer] are taught by a armed forces logistics/support/administrative officer (RD) who has been trained by the [employer]. The chef or RD has a clear understanding of the importance of minimizing - if not completely eliminating - added fat, sugar, and sodium in recipes. Throughout the series of Cooking School Workshop sessions, patients will learn about healthy ingredients and efficient methods of cooking to build confidence in their capability to prepare    Cooking School weekly topics:  Adding Flavor- Sodium-Free  Fast and Healthy Breakfasts  Powerhouse Plant-Based Proteins  Satisfying  Salads and Dressings  Simple Sides and Sauces  International Cuisine-Spotlight on the [employer] Zones  Delicious Desserts  Savory Soups  [employer] - Meals in a Snap  Tasty Appetizers and Snacks  Comforting Weekend Breakfasts  One-Pot Wonders   Fast Evening Meals  Landscape Architect Your Pritikin Plate  WORKSHOPS   Healthy Mindset (Psychosocial):  Focused Goals, Sustainable Changes Clinical staff led group instruction and group discussion with PowerPoint presentation and patient guidebook. To enhance the learning environment the use of posters, models and videos may be added. Patients will be able to apply effective goal setting strategies to establish at least one personal goal, and then take consistent, meaningful action toward that goal. They will learn to identify common barriers to achieving personal goals and develop strategies to overcome them. Patients will also gain an understanding of how our mind-set can impact our ability to achieve goals and the importance of cultivating a positive and growth-oriented mind-set. The purpose of this lesson is to provide patients with a deeper understanding of how to set and  achieve personal goals, as well as the tools and strategies needed to overcome common obstacles which may arise along the way.  From Head to Heart: The Power of a Healthy Outlook  Clinical staff led group instruction and group discussion with PowerPoint presentation and patient guidebook. To enhance the learning environment the use of posters, models and videos may be added. Patients will be able to recognize and describe the impact of emotions and mood on physical health. They will discover the importance of self-care and explore self-care practices which may work for them. Patients will also learn how to utilize the 4 Cs to cultivate a healthier outlook and better manage stress and challenges. The purpose of this lesson is to demonstrate to patients how a healthy outlook is an essential part of maintaining good health, especially as they continue their cardiac rehab journey.  Healthy Sleep for a Healthy Heart Clinical staff led group instruction and group discussion with PowerPoint presentation and patient guidebook. To enhance the learning environment the use of posters, models and videos may be added. At the conclusion of this workshop, patients will be able to demonstrate knowledge of the importance of sleep to overall health, well-being, and quality of life. They will understand the symptoms of, and treatments for, common sleep disorders. Patients will also be able to identify daytime and nighttime behaviors which impact sleep, and they will be able to apply these tools to help manage sleep-related challenges. The purpose of this lesson is to provide patients with a general overview of sleep and outline the importance of quality sleep. Patients will learn about a few of the most common sleep disorders. Patients will also be introduced to the concept of sleep hygiene, and discover ways to self-manage certain sleeping problems through simple daily behavior changes. Finally, the workshop will motivate  patients by clarifying the links between quality sleep and their goals of heart-healthy living.   Recognizing and Reducing Stress Clinical staff led group instruction and group discussion with PowerPoint presentation and patient guidebook. To enhance the learning environment the use of posters, models and videos may be added. At the conclusion of this workshop, patients will be able to understand the types of stress reactions, differentiate between acute and chronic stress, and recognize the impact that chronic stress has on their health. They will also be able to apply different coping mechanisms, such as reframing negative self-talk. Patients will have the opportunity to  practice a variety of stress management techniques, such as deep abdominal breathing, progressive muscle relaxation, and/or guided imagery.  The purpose of this lesson is to educate patients on the role of stress in their lives and to provide healthy techniques for coping with it.  Learning Barriers/Preferences:  Learning Barriers/Preferences - 01/19/25 0945       Learning Barriers/Preferences   Learning Barriers Hearing   Has hearing aids but not wearing them.   Learning Preferences Audio;Computer/Internet;Group Instruction;Individual Instruction;Pictoral;Skilled Demonstration;Verbal Instruction;Video;Written Material          Education Topics:  Knowledge Questionnaire Score:  Knowledge Questionnaire Score - 01/19/25 1408       Knowledge Questionnaire Score   Pre Score 20/24          Core Components/Risk Factors/Patient Goals at Admission:  Personal Goals and Risk Factors at Admission - 01/19/25 0924       Core Components/Risk Factors/Patient Goals on Admission   Diabetes Yes    Intervention Provide education about signs/symptoms and action to take for hypo/hyperglycemia.;Provide education about proper nutrition, including hydration, and aerobic/resistive exercise prescription along with prescribed medications  to achieve blood glucose in normal ranges: Fasting glucose 65-99 mg/dL    Expected Outcomes Short Term: Participant verbalizes understanding of the signs/symptoms and immediate care of hyper/hypoglycemia, proper foot care and importance of medication, aerobic/resistive exercise and nutrition plan for blood glucose control.;Long Term: Attainment of HbA1C < 7%.    Hypertension Yes    Intervention Provide education on lifestyle modifcations including regular physical activity/exercise, weight management, moderate sodium restriction and increased consumption of fresh fruit, vegetables, and low fat dairy, alcohol moderation, and smoking cessation.;Monitor prescription use compliance.    Expected Outcomes Short Term: Continued assessment and intervention until BP is < 140/77mm HG in hypertensive participants. < 130/11mm HG in hypertensive participants with diabetes, heart failure or chronic kidney disease.;Long Term: Maintenance of blood pressure at goal levels.    Lipids Yes    Intervention Provide education and support for participant on nutrition & aerobic/resistive exercise along with prescribed medications to achieve LDL 70mg , HDL >40mg .    Expected Outcomes Short Term: Participant states understanding of desired cholesterol values and is compliant with medications prescribed. Participant is following exercise prescription and nutrition guidelines.;Long Term: Cholesterol controlled with medications as prescribed, with individualized exercise RX and with personalized nutrition plan. Value goals: LDL < 70mg , HDL > 40 mg.          Core Components/Risk Factors/Patient Goals Review:    Core Components/Risk Factors/Patient Goals at Discharge (Final Review):    ITP Comments:  ITP Comments     Row Name 01/19/25 0835           ITP Comments Medical Director- Dr. Wilbert Bihari, MD. Introduction to the Pritikin Education/ Intensive Cardiac Rehab Program. Reviewed initial orientation folder with Elna.           Comments: Frederick Miles attended orientation for the cardiac rehabilitation program on  01/19/2025  to perform initial intake and exercise walk test. He was introduced to the [employer] education and orientation packet was reviewed. Completed 6-minute walk test, measurements, initial ITP, and exercise prescription. Vital signs: blood pressure mildly elevated at rest. Telemetry-sinus bradycardia, asymptomatic.   Service time was from 835 to 1047. Arnoldo CHRISTELLA Gal, MS, ACSM CEP 01/19/2025 1414        [1]  Current Outpatient Medications:    acetaminophen  (TYLENOL ) 325 MG tablet, Take 2 tablets (650 mg total) by mouth every 6 (six) hours as  needed., Disp: , Rfl:    apixaban  (ELIQUIS ) 5 MG TABS tablet, Take 1 tablet (5 mg total) by mouth 2 (two) times daily., Disp: 60 tablet, Rfl: 1   atorvastatin  (LIPITOR) 40 MG tablet, Take 40 mg by mouth daily., Disp: , Rfl:    cyanocobalamin 1000 MCG tablet, Take 1,000 mcg by mouth daily., Disp: , Rfl:    diltiazem  (CARDIZEM  CD) 120 MG 24 hr capsule, Take 120 mg by mouth daily., Disp: , Rfl:    empagliflozin  (JARDIANCE ) 10 MG TABS tablet, Take 10 mg by mouth daily. , Disp: , Rfl:    hydrochlorothiazide  (MICROZIDE ) 12.5 MG capsule, Take 12.5 mg by mouth daily., Disp: , Rfl:    insulin  glargine (LANTUS  SOLOSTAR) 100 UNIT/ML Solostar Pen, Inject 25 Units into the skin at bedtime. (Patient taking differently: Inject 24 Units into the skin at bedtime.), Disp: , Rfl:    insulin  lispro (HUMALOG) 100 UNIT/ML injection, Inject 6-14 Units into the skin 3 (three) times daily with meals. (Patient taking differently: Inject 12 Units into the skin 3 (three) times daily with meals.), Disp: , Rfl:    levothyroxine  (SYNTHROID ) 100 MCG tablet, Take 100 mcg by mouth daily., Disp: , Rfl:    lisinopril  (ZESTRIL ) 20 MG tablet, Take 20 mg by mouth daily., Disp: , Rfl:    Magnesium  500 MG CAPS, Take 500 mg by mouth daily., Disp: , Rfl:    metFORMIN  (GLUCOPHAGE ) 500 MG  tablet, Take 500 mg by mouth daily., Disp: , Rfl:    omeprazole (PRILOSEC) 20 MG capsule, Take 20 mg by mouth daily., Disp: , Rfl:    amiodarone  (PACERONE ) 200 MG tablet, Take 1 tablet twice per day for 7 days, then take 1 tablet daily thereafter, Disp: 60 tablet, Rfl: 1   Continuous Blood Gluc Sensor (FREESTYLE LIBRE 2 SENSOR) MISC, CHANGE EVERY 14 DAYS TO MONITOR BLOOD GLUCOSE CONTINUOUSLY, Disp: , Rfl:    midodrine  (PROAMATINE ) 2.5 MG tablet, Take 1 tablet (2.5 mg total) by mouth 3 (three) times daily with meals., Disp: 30 tablet, Rfl: 0   traMADol  (ULTRAM ) 50 MG tablet, Take 1 tablet (50 mg total) by mouth every 6 (six) hours as needed for severe pain (pain score 7-10)., Disp: 28 tablet, Rfl: 0   Turmeric (QC TUMERIC COMPLEX PO), Take 1 capsule by mouth daily., Disp: , Rfl:  [2]  Social History Tobacco Use  Smoking Status Former   Current packs/day: 0.00   Types: Cigarettes   Quit date: 08/05/1979   Years since quitting: 45.4  Smokeless Tobacco Never

## 2025-01-19 NOTE — Telephone Encounter (Signed)
 Called patient regarding missed orientation appointment this morning. Left message on patient's voicemail for call back. Will forward to support staff for follow-up and cancel upcoming appointments pending reschedule.

## 2025-01-25 ENCOUNTER — Encounter (HOSPITAL_COMMUNITY)

## 2025-01-26 ENCOUNTER — Telehealth (HOSPITAL_COMMUNITY): Payer: Self-pay

## 2025-01-26 ENCOUNTER — Other Ambulatory Visit: Payer: Self-pay | Admitting: Physician Assistant

## 2025-01-26 DIAGNOSIS — I480 Paroxysmal atrial fibrillation: Secondary | ICD-10-CM

## 2025-01-26 NOTE — Telephone Encounter (Signed)
 Wife called and stated that they needed to cancel CR appointment for 01/29/2025 due to her having surgery.  Cancelled appointment.

## 2025-01-27 ENCOUNTER — Encounter (HOSPITAL_COMMUNITY): Admission: RE | Admit: 2025-01-27 | Source: Ambulatory Visit

## 2025-01-27 NOTE — Telephone Encounter (Signed)
 LVM per DPR and let pt know Dr. Margurite response regarding being able to stop amiodarone . Pt told to call our office with any questions. Pt has future appt with out office on 02/05/25 with Lum Louis, NP.

## 2025-01-27 NOTE — Progress Notes (Signed)
 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  11/05/24 NSTEMI  Patient's Home Medications on Admission: Current Medications[1]  Past Medical History: Past Medical History:  Diagnosis Date   AAA (abdominal aortic aneurysm) without rupture 04/28/2017   Aorta US  1/18: 4.2 cm AAA   Bladder cancer [facility]) 2006   Cervicalgia    Coronary atherosclerosis of native coronary artery    stent x1   Diabetes mellitus without complication (HCC)    typ2   Esophageal reflux    Esophageal stricture    Heart murmur    slight   History of cardiovascular stress test    Lexiscan  Myoview  7/16: EF 66%, diaphragmatic attenuation, no ischemia, low risk   History of kidney stones    Hx of adenomatous colonic polyps    Hyperlipidemia    Hypertension    Myocardial infarct [facility])    2007   Unspecified hypothyroidism     Tobacco Use: Tobacco Use History[2]  Labs: Review Flowsheet  More data may exist      Latest Ref Rng & Units 12/10/2007 03/03/2008 01/02/2019 11/06/2024 11/07/2024  Labs for ITP Cardiac and Pulmonary Rehab  Cholestrol 0 - 200 mg/dL 91  876  - 92  -  LDL (calc) 0 - 99 mg/dL 51  65  - 42  -  HDL-C >40 mg/dL 78.5  72.3  - 35  -  Trlycerides <150 mg/dL 93  849  - 75  -  Hemoglobin A1c 4.8 - 5.6 % - - 6.4  6.4  -  PH, Arterial 7.35 - 7.45 - - - 7.44  7.402  7.380  7.301  7.283  7.379  7.341   PCO2 arterial 32 - 48 mmHg - - - 40  30.0  31.9  39.7  45.8  35.0  39.8   Bicarbonate 20.0 - 28.0 mmol/L - - - 26.5   18.5  18.8  19.6  21.7  22.5  20.6  21.5   TCO2 22 - 32 mmol/L - - - - 19  20  21  23  23  23  24  22  24  21  23    Acid-base deficit 0.0 - 2.0 mmol/L - - - - 5.0  6.0  6.0  5.0  3.0  4.0  4.0   O2 Saturation % - - - 100  99  100  100  100  69  100  100     Details       Multiple values from one day are sorted in reverse-chronological order         Capillary Blood Glucose: Lab Results  Component Value Date   GLUCAP 121 (H) 11/12/2024   GLUCAP 64 (L) 11/12/2024   GLUCAP 73 11/12/2024   GLUCAP 54 (L) 11/11/2024   GLUCAP 68 (L) 11/11/2024     Exercise Target Goals: Exercise Program Goal: Individual exercise prescription set using results from initial 6 min walk test and THRR while considering  patients activity barriers and safety.   Exercise Prescription Goal: Initial exercise  prescription builds to 30-45 minutes a day of aerobic activity, 2-3 days per week.  Home exercise guidelines will be given to patient during program as part of exercise prescription that the participant will acknowledge.  Activity Barriers & Risk Stratification:  Activity Barriers & Cardiac Risk Stratification - 01/19/25 0851       Activity Barriers & Cardiac Risk Stratification   Activity Barriers Other (comment)    Comments Occasional right hip pain with walking.    Cardiac Risk Stratification High          6 Minute Walk:  6 Minute Walk     Row Name 01/19/25 1032         6 Minute Walk   Phase Initial     Distance 1703 feet     Walk Time 6 minutes     # of Rest Breaks 0     MPH 3.22     METS 3.07     RPE 9     Perceived Dyspnea  0     VO2 Peak 10.75     Symptoms No     Resting HR 53 bpm     Resting BP 148/82     Resting Oxygen Saturation  100 %     Exercise Oxygen Saturation  during 6 min walk 100 %     Max Ex. HR 68 bpm     Max Ex. BP 112/50     2 Minute Post BP 128/66        Oxygen Initial Assessment:   Oxygen Re-Evaluation:   Oxygen Discharge (Final Oxygen  Re-Evaluation):   Initial Exercise Prescription:  Initial Exercise Prescription - 01/19/25 1200       Date of Initial Exercise RX and Referring Provider   Date 01/19/25    Referring Provider Wonda Sharper, MD    Expected Discharge Date 04/14/25      Recumbant Elliptical   Level 1    RPM 25    Watts 25    Minutes 15    METs 3      Track   Laps 15    Minutes 15    METs 2.91      Prescription Details   Frequency (times per week) 3    Duration Progress to 30 minutes of continuous aerobic without signs/symptoms of physical distress      Intensity   THRR 40-80% of Max Heartrate 57-114    Ratings of Perceived Exertion 11-13    Perceived Dyspnea 0-4      Progression   Progression Continue to progress workloads to maintain intensity without signs/symptoms of physical distress.      Resistance Training   Training Prescription Yes    Weight 2 lbs    Reps 10-15          Perform Capillary Blood Glucose checks as needed.  Exercise Prescription Changes:   Exercise Comments:   Exercise Goals and Review:   Exercise Goals     Row Name 01/19/25 0916             Exercise Goals   Increase Physical Activity Yes       Intervention Provide advice, education, support and counseling about physical activity/exercise needs.;Develop an individualized exercise prescription for aerobic and resistive training based on initial evaluation findings, risk stratification, comorbidities and participant's personal goals.       Expected Outcomes Short Term: Attend rehab on a regular basis to increase amount of physical activity.;Long Term: Add in home exercise  to make exercise part of routine and to increase amount of physical activity.;Long Term: Exercising regularly at least 3-5 days a week.       Increase Strength and Stamina Yes       Intervention Provide advice, education, support and counseling about physical activity/exercise needs.;Develop an individualized exercise prescription  for aerobic and resistive training based on initial evaluation findings, risk stratification, comorbidities and participant's personal goals.       Expected Outcomes Short Term: Increase workloads from initial exercise prescription for resistance, speed, and METs.;Short Term: Perform resistance training exercises routinely during rehab and add in resistance training at home;Long Term: Improve cardiorespiratory fitness, muscular endurance and strength as measured by increased METs and functional capacity ( )       Able to understand and use rate of perceived exertion (RPE) scale Yes       Intervention Provide education and explanation on how to use RPE scale       Expected Outcomes Short Term: Able to use RPE daily in rehab to express subjective intensity level;Long Term:  Able to use RPE to guide intensity level when exercising independently       Knowledge and understanding of Target Heart Rate Range (THRR) Yes       Intervention Provide education and explanation of THRR including how the numbers were predicted and where they are located for reference       Expected Outcomes Short Term: Able to state/look up THRR;Long Term: Able to use THRR to govern intensity when exercising independently;Short Term: Able to use daily as guideline for intensity in rehab       Able to check pulse independently Yes       Intervention Provide education and demonstration on how to check pulse in carotid and radial arteries.;Review the importance of being able to check your own pulse for safety during independent exercise       Expected Outcomes Short Term: Able to explain why pulse checking is important during independent exercise;Long Term: Able to check pulse independently and accurately       Understanding of Exercise Prescription Yes       Intervention Provide education, explanation, and written materials on patient's individual exercise prescription       Expected Outcomes Short Term: Able to explain program  exercise prescription;Long Term: Able to explain home exercise prescription to exercise independently          Exercise Goals Re-Evaluation :   Discharge Exercise Prescription (Final Exercise Prescription Changes):   Nutrition:  Target Goals: Understanding of nutrition guidelines, daily intake of sodium 1500mg , cholesterol 200mg , calories 30% from fat and 7% or less from saturated fats, daily to have 5 or more servings of fruits and vegetables.  Biometrics:  Pre Biometrics - 01/19/25 0835       Pre Biometrics   Waist Circumference 36 inches    Hip Circumference 39 inches    Waist to Hip Ratio 0.92 %    Triceps Skinfold 6 mm    % Body Fat 20.9 %    Grip Strength 21 kg    Flexibility 13.75 in    Single Leg Stand 3 seconds           Nutrition Therapy Plan and Nutrition Goals:   Nutrition Assessments:  MEDIFICTS Score Key: >=70 Need to make dietary changes  40-70 Heart Healthy Diet <= 40 Therapeutic Level Cholesterol Diet    Picture Your Plate Scores: <59 Unhealthy dietary pattern with much room for improvement. 41-50  Dietary pattern unlikely to meet recommendations for good health and room for improvement. 51-60 More healthful dietary pattern, with some room for improvement.  >60 Healthy dietary pattern, although there may be some specific behaviors that could be improved.    Nutrition Goals Re-Evaluation:   Nutrition Goals Re-Evaluation:   Nutrition Goals Discharge (Final Nutrition Goals Re-Evaluation):   Psychosocial: Target Goals: Acknowledge presence or absence of significant depression and/or stress, maximize coping skills, provide positive support system. Participant is able to verbalize types and ability to use techniques and skills needed for reducing stress and depression.  Initial Review & Psychosocial Screening:  Initial Psych Review & Screening - 01/19/25 0946       Initial Review   Current issues with None Identified      Family  Dynamics   Good Support System? Yes    Comments Great support of his wife.      Barriers   Psychosocial barriers to participate in program There are no identifiable barriers or psychosocial needs.      Screening Interventions   Interventions Encouraged to exercise    Expected Outcomes Short Term goal: Identification and review with participant of any Quality of Life or Depression concerns found by scoring the questionnaire.;Long Term goal: The participant improves quality of Life and PHQ9 Scores as seen by post scores and/or verbalization of changes          Quality of Life Scores:  Quality of Life - 01/19/25 1408       Quality of Life   Select Quality of Life      Quality of Life Scores   Health/Function Pre 30 %    Socioeconomic Pre 29.14 %    Psych/Spiritual Pre 30 %    Family Pre 30 %    GLOBAL Pre 29.82 %         Scores of 19 and below usually indicate a poorer quality of life in these areas.  A difference of  2-3 points is a clinically meaningful difference.  A difference of 2-3 points in the total score of the Quality of Life Index has been associated with significant improvement in overall quality of life, self-image, physical symptoms, and general health in studies assessing change in quality of life.  PHQ-9: Review Flowsheet       01/19/2025  Depression screen PHQ 2/9  Decreased Interest 0  Down, Depressed, Hopeless 0  PHQ - 2 Score 0  Altered sleeping 0  Tired, decreased energy 0  Change in appetite 0  Feeling bad or failure about yourself  0  Trouble concentrating 0  Moving slowly or fidgety/restless 0  Suicidal thoughts 0  PHQ-9 Score 0   Interpretation of Total Score  Total Score Depression Severity:  1-4 = Minimal depression, 5-9 = Mild depression, 10-14 = Moderate depression, 15-19 = Moderately severe depression, 20-27 = Severe depression   Psychosocial Evaluation and Intervention:   Psychosocial Re-Evaluation:   Psychosocial Discharge  (Final Psychosocial Re-Evaluation):   Vocational Rehabilitation: Provide vocational rehab assistance to qualifying candidates.   Vocational Rehab Evaluation & Intervention:  Vocational Rehab - 01/19/25 0943       Initial Vocational Rehab Evaluation & Intervention   Assessment shows need for Vocational Rehabilitation Yes    Vocational Rehab Packet given to patient 01/19/25      Vocational Rehab Re-Evaulation   Comments Sunny is interested in VR. Packet given. He will review and return if he would like to pursue.  Education: Education Goals: Education classes will be provided on a weekly basis, covering required topics. Participant will state understanding/return demonstration of topics presented.     Core Videos: Exercise    Move It!  Clinical staff conducted group or individual video education with verbal and written material and guidebook.  Patient learns the recommended Pritikin exercise program. Exercise with the goal of living a long, healthy life. Some of the health benefits of exercise include controlled diabetes, healthier blood pressure levels, improved cholesterol levels, improved heart and lung capacity, improved sleep, and better body composition. Everyone should speak with their doctor before starting or changing an exercise routine.  Biomechanical Limitations Clinical staff conducted group or individual video education with verbal and written material and guidebook.  Patient learns how biomechanical limitations can impact exercise and how we can mitigate and possibly overcome limitations to have an impactful and balanced exercise routine.  Body Composition Clinical staff conducted group or individual video education with verbal and written material and guidebook.  Patient learns that body composition (ratio of muscle mass to fat mass) is a key component to assessing overall fitness, rather than body weight alone. Increased fat mass, especially visceral  belly fat, can put us  at increased risk for metabolic syndrome, type 2 diabetes, heart disease, and even death. It is recommended to combine diet and exercise (cardiovascular and resistance training) to improve your body composition. Seek guidance from your physician and exercise physiologist before implementing an exercise routine.  Exercise Action Plan Clinical staff conducted group or individual video education with verbal and written material and guidebook.  Patient learns the recommended strategies to achieve and enjoy long-term exercise adherence, including variety, self-motivation, self-efficacy, and positive decision making. Benefits of exercise include fitness, good health, weight management, more energy, better sleep, less stress, and overall well-being.  Medical   Heart Disease Risk Reduction Clinical staff conducted group or individual video education with verbal and written material and guidebook.  Patient learns our heart is our most vital organ as it circulates oxygen, nutrients, white blood cells, and hormones throughout the entire body, and carries waste away. Data supports a plant-based eating plan like the Pritikin Program for its effectiveness in slowing progression of and reversing heart disease. The video provides a number of recommendations to address heart disease.   Metabolic Syndrome and Belly Fat  Clinical staff conducted group or individual video education with verbal and written material and guidebook.  Patient learns what metabolic syndrome is, how it leads to heart disease, and how one can reverse it and keep it from coming back. You have metabolic syndrome if you have 3 of the following 5 criteria: abdominal obesity, high blood pressure, high triglycerides, low HDL cholesterol, and high blood sugar.  Hypertension and Heart Disease Clinical staff conducted group or individual video education with verbal and written material and guidebook.  Patient learns that high  blood pressure, or hypertension, is very common in the [place] . Hypertension is largely due to excessive salt intake, but other important risk factors include being overweight, physical inactivity, drinking too much alcohol, smoking, and not eating enough potassium from fruits and vegetables. High blood pressure is a leading risk factor for heart attack, stroke, congestive heart failure, dementia, kidney failure, and premature death. Long-term effects of excessive salt intake include stiffening of the arteries and thickening of heart muscle and organ damage. Recommendations include ways to reduce hypertension and the risk of heart disease.  Diseases of Our Time - Focusing on Diabetes Clinical  staff conducted group or individual video education with verbal and written material and guidebook.  Patient learns why the best way to stop diseases of our time is prevention, through food and other lifestyle changes. Medicine (such as prescription pills and surgeries) is often only a Band-Aid on the problem, not a long-term solution. Most common diseases of our time include obesity, type 2 diabetes, hypertension, heart disease, and cancer. The Pritikin Program is recommended and has been proven to help reduce, reverse, and/or prevent the damaging effects of metabolic syndrome.  Nutrition   Overview of the Pritikin Eating Plan  Clinical staff conducted group or individual video education with verbal and written material and guidebook.  Patient learns about the Pritikin Eating Plan for disease risk reduction. The Pritikin Eating Plan emphasizes a wide variety of unrefined, minimally-processed carbohydrates, like fruits, vegetables, whole grains, and legumes. Go, Caution, and Stop food choices are explained. Plant-based and lean animal proteins are emphasized. Rationale provided for low sodium intake for blood pressure control, low added sugars for blood sugar stabilization, and low added fats and oils for  coronary artery disease risk reduction and weight management.  Calorie Density  Clinical staff conducted group or individual video education with verbal and written material and guidebook.  Patient learns about calorie density and how it impacts the Pritikin Eating Plan. Knowing the characteristics of the food you choose will help you decide whether those foods will lead to weight gain or weight loss, and whether you want to consume more or less of them. Weight loss is usually a side effect of the Pritikin Eating Plan because of its focus on low calorie-dense foods.  Label Reading  Clinical staff conducted group or individual video education with verbal and written material and guidebook.  Patient learns about the Pritikin recommended label reading guidelines and corresponding recommendations regarding calorie density, added sugars, sodium content, and whole grains.  Dining Out - Part 1  Clinical staff conducted group or individual video education with verbal and written material and guidebook.  Patient learns that restaurant meals can be sabotaging because they can be so high in calories, fat, sodium, and/or sugar. Patient learns recommended strategies on how to positively address this and avoid unhealthy pitfalls.  Facts on Fats  Clinical staff conducted group or individual video education with verbal and written material and guidebook.  Patient learns that lifestyle modifications can be just as effective, if not more so, as many medications for lowering your risk of heart disease. A Pritikin lifestyle can help to reduce your risk of inflammation and atherosclerosis (cholesterol build-up, or plaque, in the artery walls). Lifestyle interventions such as dietary choices and physical activity address the cause of atherosclerosis. A review of the types of fats and their impact on blood cholesterol levels, along with dietary recommendations to reduce fat intake is also included.  Nutrition Action Plan   Clinical staff conducted group or individual video education with verbal and written material and guidebook.  Patient learns how to incorporate Pritikin recommendations into their lifestyle. Recommendations include planning and keeping personal health goals in mind as an important part of their success.  Healthy Mind-Set    Healthy Minds, Bodies, Hearts  Clinical staff conducted group or individual video education with verbal and written material and guidebook.  Patient learns how to identify when they are stressed. Video will discuss the impact of that stress, as well as the many benefits of stress management. Patient will also be introduced to stress management techniques. The way  we think, act, and feel has an impact on our hearts.  How Our Thoughts Can Heal Our Hearts  Clinical staff conducted group or individual video education with verbal and written material and guidebook.  Patient learns that negative thoughts can cause depression and anxiety. This can result in negative lifestyle behavior and serious health problems. Cognitive behavioral therapy is an effective method to help control our thoughts in order to change and improve our emotional outlook.  Additional Videos:  Exercise    Improving Performance  Clinical staff conducted group or individual video education with verbal and written material and guidebook.  Patient learns to use a non-linear approach by alternating intensity levels and lengths of time spent exercising to help burn more calories and lose more body fat. Cardiovascular exercise helps improve heart health, metabolism, hormonal balance, blood sugar control, and recovery from fatigue. Resistance training improves strength, endurance, balance, coordination, reaction time, metabolism, and muscle mass. Flexibility exercise improves circulation, posture, and balance. Seek guidance from your physician and exercise physiologist before implementing an exercise routine and learn  your capabilities and proper form for all exercise.  Introduction to Yoga  Clinical staff conducted group or individual video education with verbal and written material and guidebook.  Patient learns about yoga, a discipline of the coming together of mind, breath, and body. The benefits of yoga include improved flexibility, improved range of motion, better posture and core strength, increased lung function, weight loss, and positive self-image. Yogas heart health benefits include lowered blood pressure, healthier heart rate, decreased cholesterol and triglyceride levels, improved immune function, and reduced stress. Seek guidance from your physician and exercise physiologist before implementing an exercise routine and learn your capabilities and proper form for all exercise.  Medical   Aging: Enhancing Your Quality of Life  Clinical staff conducted group or individual video education with verbal and written material and guidebook.  Patient learns key strategies and recommendations to stay in good physical health and enhance quality of life, such as prevention strategies, having an advocate, securing a Health Care Proxy and Power of Attorney, and keeping a list of medications and system for tracking them. It also discusses how to avoid risk for bone loss.  Biology of Weight Control  Clinical staff conducted group or individual video education with verbal and written material and guidebook.  Patient learns that weight gain occurs because we consume more calories than we burn (eating more, moving less). Even if your body weight is normal, you may have higher ratios of fat compared to muscle mass. Too much body fat puts you at increased risk for cardiovascular disease, heart attack, stroke, type 2 diabetes, and obesity-related cancers. In addition to exercise, following the Pritikin Eating Plan can help reduce your risk.  Decoding Lab Results  Clinical staff conducted group or individual video education  with verbal and written material and guidebook.  Patient learns that lab test reflects one measurement whose values change over time and are influenced by many factors, including medication, stress, sleep, exercise, food, hydration, pre-existing medical conditions, and more. It is recommended to use the knowledge from this video to become more involved with your lab results and evaluate your numbers to speak with your doctor.   Diseases of Our Time - Overview  Clinical staff conducted group or individual video education with verbal and written material and guidebook.  Patient learns that according to the CDC, 50% to 70% of chronic diseases (such as obesity, type 2 diabetes, elevated lipids, hypertension, and heart  disease) are avoidable through lifestyle improvements including healthier food choices, listening to satiety cues, and increased physical activity.  Sleep Disorders Clinical staff conducted group or individual video education with verbal and written material and guidebook.  Patient learns how good quality and duration of sleep are important to overall health and well-being. Patient also learns about sleep disorders and how they impact health along with recommendations to address them, including discussing with a physician.  Nutrition  Dining Out - Part 2 Clinical staff conducted group or individual video education with verbal and written material and guidebook.  Patient learns how to plan ahead and communicate in order to maximize their dining experience in a healthy and nutritious manner. Included are recommended food choices based on the type of restaurant the patient is visiting.   Fueling a Banker conducted group or individual video education with verbal and written material and guidebook.  There is a strong connection between our food choices and our health. Diseases like obesity and type 2 diabetes are very prevalent and are in large-part due to lifestyle  choices. The Pritikin Eating Plan provides plenty of food and hunger-curbing satisfaction. It is easy to follow, affordable, and helps reduce health risks.  Menu Workshop  Clinical staff conducted group or individual video education with verbal and written material and guidebook.  Patient learns that restaurant meals can sabotage health goals because they are often packed with calories, fat, sodium, and sugar. Recommendations include strategies to plan ahead and to communicate with the manager, chef, or server to help order a healthier meal.  Planning Your Eating Strategy  Clinical staff conducted group or individual video education with verbal and written material and guidebook.  Patient learns about the Pritikin Eating Plan and its benefit of reducing the risk of disease. The Pritikin Eating Plan does not focus on calories. Instead, it emphasizes high-quality, nutrient-rich foods. By knowing the characteristics of the foods, we choose, we can determine their calorie density and make informed decisions.  Targeting Your Nutrition Priorities  Clinical staff conducted group or individual video education with verbal and written material and guidebook.  Patient learns that lifestyle habits have a tremendous impact on disease risk and progression. This video provides eating and physical activity recommendations based on your personal health goals, such as reducing LDL cholesterol, losing weight, preventing or controlling type 2 diabetes, and reducing high blood pressure.  Vitamins and Minerals  Clinical staff conducted group or individual video education with verbal and written material and guidebook.  Patient learns different ways to obtain key vitamins and minerals, including through a recommended healthy diet. It is important to discuss all supplements you take with your doctor.   Healthy Mind-Set    Smoking Cessation  Clinical staff conducted group or individual video education with verbal and  written material and guidebook.  Patient learns that cigarette smoking and tobacco addiction pose a serious health risk which affects millions of people. Stopping smoking will significantly reduce the risk of heart disease, lung disease, and many forms of cancer. Recommended strategies for quitting are covered, including working with your doctor to develop a successful plan.  Culinary   Becoming a Set Designer conducted group or individual video education with verbal and written material and guidebook.  Patient learns that cooking at home can be healthy, cost-effective, quick, and puts them in control. Keys to cooking healthy recipes will include looking at your recipe, assessing your equipment needs, planning ahead, making it simple,  choosing cost-effective seasonal ingredients, and limiting the use of added fats, salts, and sugars.  Cooking - Breakfast and Snacks  Clinical staff conducted group or individual video education with verbal and written material and guidebook.  Patient learns how important breakfast is to satiety and nutrition through the entire day. Recommendations include key foods to eat during breakfast to help stabilize blood sugar levels and to prevent overeating at meals later in the day. Planning ahead is also a key component.  Cooking - Educational Psychologist conducted group or individual video education with verbal and written material and guidebook.  Patient learns eating strategies to improve overall health, including an approach to cook more at home. Recommendations include thinking of animal protein as a side on your plate rather than center stage and focusing instead on lower calorie dense options like vegetables, fruits, whole grains, and plant-based proteins, such as beans. Making sauces in large quantities to freeze for later and leaving the skin on your vegetables are also recommended to maximize your experience.  Cooking - Healthy Salads and  Dressing Clinical staff conducted group or individual video education with verbal and written material and guidebook.  Patient learns that vegetables, fruits, whole grains, and legumes are the foundations of the Pritikin Eating Plan. Recommendations include how to incorporate each of these in flavorful and healthy salads, and how to create homemade salad dressings. Proper handling of ingredients is also covered. Cooking - Soups and [employer] - Soups and Desserts Clinical staff conducted group or individual video education with verbal and written material and guidebook.  Patient learns that Pritikin soups and desserts make for easy, nutritious, and delicious snacks and meal components that are low in sodium, fat, sugar, and calorie density, while high in vitamins, minerals, and filling fiber. Recommendations include simple and healthy ideas for soups and desserts.   Overview     The Pritikin Solution Program Overview Clinical staff conducted group or individual video education with verbal and written material and guidebook.  Patient learns that the results of the Pritikin Program have been documented in more than 100 articles published in peer-reviewed journals, and the benefits include reducing risk factors for (and, in some cases, even reversing) high cholesterol, high blood pressure, type 2 diabetes, obesity, and more! An overview of the three key pillars of the Pritikin Program will be covered: eating well, doing regular exercise, and having a healthy mind-set.  WORKSHOPS  Exercise: Exercise Basics: Building Your Action Plan Clinical staff led group instruction and group discussion with PowerPoint presentation and patient guidebook. To enhance the learning environment the use of posters, models and videos may be added. At the conclusion of this workshop, patients will comprehend the difference between physical activity and exercise, as well as the benefits of incorporating both, into  their routine. Patients will understand the FITT (Frequency, Intensity, Time, and Type) principle and how to use it to build an exercise action plan. In addition, safety concerns and other considerations for exercise and cardiac rehab will be addressed by the presenter. The purpose of this lesson is to promote a comprehensive and effective weekly exercise routine in order to improve patients overall level of fitness.   Managing Heart Disease: Your Path to a Healthier Heart Clinical staff led group instruction and group discussion with PowerPoint presentation and patient guidebook. To enhance the learning environment the use of posters, models and videos may be added.At the conclusion of this workshop, patients will understand the anatomy and physiology  of the heart. Additionally, they will understand how Pritikins three pillars impact the risk factors, the progression, and the management of heart disease.  The purpose of this lesson is to provide a high-level overview of the heart, heart disease, and how the Pritikin lifestyle positively impacts risk factors.  Exercise Biomechanics Clinical staff led group instruction and group discussion with PowerPoint presentation and patient guidebook. To enhance the learning environment the use of posters, models and videos may be added. Patients will learn how the structural parts of their bodies function and how these functions impact their daily activities, movement, and exercise. Patients will learn how to promote a neutral spine, learn how to manage pain, and identify ways to improve their physical movement in order to promote healthy living. The purpose of this lesson is to expose patients to common physical limitations that impact physical activity. Participants will learn practical ways to adapt and manage aches and pains, and to minimize their effect on regular exercise. Patients will learn how to maintain good posture while sitting, walking, and  lifting.  Balance Training and Fall Prevention  Clinical staff led group instruction and group discussion with PowerPoint presentation and patient guidebook. To enhance the learning environment the use of posters, models and videos may be added. At the conclusion of this workshop, patients will understand the importance of their sensorimotor skills (vision, proprioception, and the vestibular system) in maintaining their ability to balance as they age. Patients will apply a variety of balancing exercises that are appropriate for their current level of function. Patients will understand the common causes for poor balance, possible solutions to these problems, and ways to modify their physical environment in order to minimize their fall risk. The purpose of this lesson is to teach patients about the importance of maintaining balance as they age and ways to minimize their risk of falling.  WORKSHOPS   Nutrition:  Fueling a Ship Broker led group instruction and group discussion with PowerPoint presentation and patient guidebook. To enhance the learning environment the use of posters, models and videos may be added. Patients will review the foundational principles of the Pritikin Eating Plan and understand what constitutes a serving size in each of the food groups. Patients will also learn Pritikin-friendly foods that are better choices when away from home and review make-ahead meal and snack options. Calorie density will be reviewed and applied to three nutrition priorities: weight maintenance, weight loss, and weight gain. The purpose of this lesson is to reinforce (in a group setting) the key concepts around what patients are recommended to eat and how to apply these guidelines when away from home by planning and selecting Pritikin-friendly options. Patients will understand how calorie density may be adjusted for different weight management goals.  Mindful Eating  Clinical staff led  group instruction and group discussion with PowerPoint presentation and patient guidebook. To enhance the learning environment the use of posters, models and videos may be added. Patients will briefly review the concepts of the Pritikin Eating Plan and the importance of low-calorie dense foods. The concept of mindful eating will be introduced as well as the importance of paying attention to internal hunger signals. Triggers for non-hunger eating and techniques for dealing with triggers will be explored. The purpose of this lesson is to provide patients with the opportunity to review the basic principles of the Pritikin Eating Plan, discuss the value of eating mindfully and how to measure internal cues of hunger and fullness using the Hunger Scale.  Patients will also discuss reasons for non-hunger eating and learn strategies to use for controlling emotional eating.  Targeting Your Nutrition Priorities Clinical staff led group instruction and group discussion with PowerPoint presentation and patient guidebook. To enhance the learning environment the use of posters, models and videos may be added. Patients will learn how to determine their genetic susceptibility to disease by reviewing their family history. Patients will gain insight into the importance of diet as part of an overall healthy lifestyle in mitigating the impact of genetics and other environmental insults. The purpose of this lesson is to provide patients with the opportunity to assess their personal nutrition priorities by looking at their family history, their own health history and current risk factors. Patients will also be able to discuss ways of prioritizing and modifying the Pritikin Eating Plan for their highest risk areas  Menu  Clinical staff led group instruction and group discussion with PowerPoint presentation and patient guidebook. To enhance the learning environment the use of posters, models and videos may be added. Using menus  brought in from [employer], or printed from [employer], patients will apply the Pritikin dining out guidelines that were presented in the [employer] video. Patients will also be able to practice these guidelines in a variety of provided scenarios. The purpose of this lesson is to provide patients with the opportunity to practice hands-on learning of the Pritikin Dining Out guidelines with actual menus and practice scenarios.  Label Reading Clinical staff led group instruction and group discussion with PowerPoint presentation and patient guidebook. To enhance the learning environment the use of posters, models and videos may be added. Patients will review and discuss the Pritikin label reading guidelines presented in Pritikins Label Reading Educational series video. Using fool labels brought in from local grocery stores and markets, patients will apply the label reading guidelines and determine if the packaged food meet the Pritikin guidelines. The purpose of this lesson is to provide patients with the opportunity to review, discuss, and practice hands-on learning of the Pritikin Label Reading guidelines with actual packaged food labels. Cooking School  Pritikins [employer] are designed to teach patients ways to prepare quick, simple, and affordable recipes at home. The importance of nutritions role in chronic disease risk reduction is reflected in its emphasis in the overall Pritikin program. By learning how to prepare essential core Pritikin Eating Plan recipes, patients will increase control over what they eat; be able to customize the flavor of foods without the use of added salt, sugar, or fat; and improve the quality of the food they consume. By learning a set of core recipes which are easily assembled, quickly prepared, and affordable, patients are more likely to prepare more healthy foods at home. These workshops focus on convenient breakfasts, simple  entres, side dishes, and desserts which can be prepared with minimal effort and are consistent with nutrition recommendations for cardiovascular risk reduction. Cooking [employer] are taught by a armed forces logistics/support/administrative officer (RD) who has been trained by the [employer]. The chef or RD has a clear understanding of the importance of minimizing - if not completely eliminating - added fat, sugar, and sodium in recipes. Throughout the series of Cooking School Workshop sessions, patients will learn about healthy ingredients and efficient methods of cooking to build confidence in their capability to prepare    Cooking School weekly topics:  Adding Flavor- Sodium-Free  Fast and Healthy Breakfasts  Powerhouse Plant-Based Proteins  Satisfying  Salads and Dressings  Simple Sides and Sauces  International Cuisine-Spotlight on the [employer] Zones  Delicious Desserts  Savory Soups  [employer] - Meals in a Snap  Tasty Appetizers and Snacks  Comforting Weekend Breakfasts  One-Pot Wonders   Fast Evening Meals  Landscape Architect Your Pritikin Plate  WORKSHOPS   Healthy Mindset (Psychosocial):  Focused Goals, Sustainable Changes Clinical staff led group instruction and group discussion with PowerPoint presentation and patient guidebook. To enhance the learning environment the use of posters, models and videos may be added. Patients will be able to apply effective goal setting strategies to establish at least one personal goal, and then take consistent, meaningful action toward that goal. They will learn to identify common barriers to achieving personal goals and develop strategies to overcome them. Patients will also gain an understanding of how our mind-set can impact our ability to achieve goals and the importance of cultivating a positive and growth-oriented mind-set. The purpose of this lesson is to provide patients with a deeper understanding of how to set and  achieve personal goals, as well as the tools and strategies needed to overcome common obstacles which may arise along the way.  From Head to Heart: The Power of a Healthy Outlook  Clinical staff led group instruction and group discussion with PowerPoint presentation and patient guidebook. To enhance the learning environment the use of posters, models and videos may be added. Patients will be able to recognize and describe the impact of emotions and mood on physical health. They will discover the importance of self-care and explore self-care practices which may work for them. Patients will also learn how to utilize the 4 Cs to cultivate a healthier outlook and better manage stress and challenges. The purpose of this lesson is to demonstrate to patients how a healthy outlook is an essential part of maintaining good health, especially as they continue their cardiac rehab journey.  Healthy Sleep for a Healthy Heart Clinical staff led group instruction and group discussion with PowerPoint presentation and patient guidebook. To enhance the learning environment the use of posters, models and videos may be added. At the conclusion of this workshop, patients will be able to demonstrate knowledge of the importance of sleep to overall health, well-being, and quality of life. They will understand the symptoms of, and treatments for, common sleep disorders. Patients will also be able to identify daytime and nighttime behaviors which impact sleep, and they will be able to apply these tools to help manage sleep-related challenges. The purpose of this lesson is to provide patients with a general overview of sleep and outline the importance of quality sleep. Patients will learn about a few of the most common sleep disorders. Patients will also be introduced to the concept of sleep hygiene, and discover ways to self-manage certain sleeping problems through simple daily behavior changes. Finally, the workshop will motivate  patients by clarifying the links between quality sleep and their goals of heart-healthy living.   Recognizing and Reducing Stress Clinical staff led group instruction and group discussion with PowerPoint presentation and patient guidebook. To enhance the learning environment the use of posters, models and videos may be added. At the conclusion of this workshop, patients will be able to understand the types of stress reactions, differentiate between acute and chronic stress, and recognize the impact that chronic stress has on their health. They will also be able to apply different coping mechanisms, such as reframing negative self-talk. Patients will have the opportunity to  practice a variety of stress management techniques, such as deep abdominal breathing, progressive muscle relaxation, and/or guided imagery.  The purpose of this lesson is to educate patients on the role of stress in their lives and to provide healthy techniques for coping with it.  Learning Barriers/Preferences:  Learning Barriers/Preferences - 01/19/25 0945       Learning Barriers/Preferences   Learning Barriers Hearing   Has hearing aids but not wearing them.   Learning Preferences Audio;Computer/Internet;Group Instruction;Individual Instruction;Pictoral;Skilled Demonstration;Verbal Instruction;Video;Written Material          Education Topics:  Knowledge Questionnaire Score:  Knowledge Questionnaire Score - 01/19/25 1408       Knowledge Questionnaire Score   Pre Score 20/24          Core Components/Risk Factors/Patient Goals at Admission:  Personal Goals and Risk Factors at Admission - 01/19/25 0924       Core Components/Risk Factors/Patient Goals on Admission   Diabetes Yes    Intervention Provide education about signs/symptoms and action to take for hypo/hyperglycemia.;Provide education about proper nutrition, including hydration, and aerobic/resistive exercise prescription along with prescribed medications  to achieve blood glucose in normal ranges: Fasting glucose 65-99 mg/dL    Expected Outcomes Short Term: Participant verbalizes understanding of the signs/symptoms and immediate care of hyper/hypoglycemia, proper foot care and importance of medication, aerobic/resistive exercise and nutrition plan for blood glucose control.;Long Term: Attainment of HbA1C < 7%.    Hypertension Yes    Intervention Provide education on lifestyle modifcations including regular physical activity/exercise, weight management, moderate sodium restriction and increased consumption of fresh fruit, vegetables, and low fat dairy, alcohol moderation, and smoking cessation.;Monitor prescription use compliance.    Expected Outcomes Short Term: Continued assessment and intervention until BP is < 140/20mm HG in hypertensive participants. < 130/41mm HG in hypertensive participants with diabetes, heart failure or chronic kidney disease.;Long Term: Maintenance of blood pressure at goal levels.    Lipids Yes    Intervention Provide education and support for participant on nutrition & aerobic/resistive exercise along with prescribed medications to achieve LDL 70mg , HDL >40mg .    Expected Outcomes Short Term: Participant states understanding of desired cholesterol values and is compliant with medications prescribed. Participant is following exercise prescription and nutrition guidelines.;Long Term: Cholesterol controlled with medications as prescribed, with individualized exercise RX and with personalized nutrition plan. Value goals: LDL < 70mg , HDL > 40 mg.          Core Components/Risk Factors/Patient Goals Review:    Core Components/Risk Factors/Patient Goals at Discharge (Final Review):    ITP Comments:  ITP Comments     Row Name 01/19/25 0835 01/27/25 0831         ITP Comments Medical Director- Dr. Wilbert Bihari, MD. Introduction to the Pritikin Education/ Intensive Cardiac Rehab Program. Reviewed initial orientation folder  with Elna. 30 Day ITP Review. Kage has cancelled his first day of exercise due to surgical procedure         Comments: See ITP comments.Hadassah Elpidio Quan RN BSN     [1]  Current Outpatient Medications:    acetaminophen  (TYLENOL ) 325 MG tablet, Take 2 tablets (650 mg total) by mouth every 6 (six) hours as needed., Disp: , Rfl:    apixaban  (ELIQUIS ) 5 MG TABS tablet, Take 1 tablet (5 mg total) by mouth 2 (two) times daily., Disp: 60 tablet, Rfl: 1   atorvastatin  (LIPITOR) 40 MG tablet, Take 40 mg by mouth daily., Disp: , Rfl:    cyanocobalamin 1000 MCG  tablet, Take 1,000 mcg by mouth daily., Disp: , Rfl:    diltiazem  (CARDIZEM  CD) 120 MG 24 hr capsule, Take 120 mg by mouth daily., Disp: , Rfl:    empagliflozin  (JARDIANCE ) 10 MG TABS tablet, Take 10 mg by mouth daily. , Disp: , Rfl:    hydrochlorothiazide  (MICROZIDE ) 12.5 MG capsule, Take 12.5 mg by mouth daily., Disp: , Rfl:    insulin  glargine (LANTUS  SOLOSTAR) 100 UNIT/ML Solostar Pen, Inject 25 Units into the skin at bedtime. (Patient taking differently: Inject 24 Units into the skin at bedtime.), Disp: , Rfl:    insulin  lispro (HUMALOG) 100 UNIT/ML injection, Inject 6-14 Units into the skin 3 (three) times daily with meals. (Patient taking differently: Inject 12 Units into the skin 3 (three) times daily with meals.), Disp: , Rfl:    levothyroxine  (SYNTHROID ) 100 MCG tablet, Take 100 mcg by mouth daily., Disp: , Rfl:    lisinopril  (ZESTRIL ) 20 MG tablet, Take 20 mg by mouth daily., Disp: , Rfl:    Magnesium  500 MG CAPS, Take 500 mg by mouth daily., Disp: , Rfl:    metFORMIN  (GLUCOPHAGE ) 500 MG tablet, Take 500 mg by mouth daily., Disp: , Rfl:    omeprazole (PRILOSEC) 20 MG capsule, Take 20 mg by mouth daily., Disp: , Rfl:    amiodarone  (PACERONE ) 200 MG tablet, Take 1 tablet twice per day for 7 days, then take 1 tablet daily thereafter, Disp: 60 tablet, Rfl: 1   Continuous Blood Gluc Sensor (FREESTYLE LIBRE 2 SENSOR) MISC, CHANGE  EVERY 14 DAYS TO MONITOR BLOOD GLUCOSE CONTINUOUSLY, Disp: , Rfl:    midodrine  (PROAMATINE ) 2.5 MG tablet, Take 1 tablet (2.5 mg total) by mouth 3 (three) times daily with meals., Disp: 30 tablet, Rfl: 0   traMADol  (ULTRAM ) 50 MG tablet, Take 1 tablet (50 mg total) by mouth every 6 (six) hours as needed for severe pain (pain score 7-10)., Disp: 28 tablet, Rfl: 0   Turmeric (QC TUMERIC COMPLEX PO), Take 1 capsule by mouth daily., Disp: , Rfl:  [2]  Social History Tobacco Use  Smoking Status Former   Current packs/day: 0.00   Types: Cigarettes   Quit date: 08/05/1979   Years since quitting: 45.5  Smokeless Tobacco Never

## 2025-01-29 ENCOUNTER — Encounter (HOSPITAL_COMMUNITY)

## 2025-02-01 ENCOUNTER — Encounter (HOSPITAL_COMMUNITY)

## 2025-02-03 ENCOUNTER — Encounter (HOSPITAL_COMMUNITY)
Admission: RE | Admit: 2025-02-03 | Discharge: 2025-02-03 | Disposition: A | Source: Ambulatory Visit | Attending: Cardiovascular Disease

## 2025-02-03 DIAGNOSIS — E162 Hypoglycemia, unspecified: Secondary | ICD-10-CM

## 2025-02-03 DIAGNOSIS — I214 Non-ST elevation (NSTEMI) myocardial infarction: Secondary | ICD-10-CM

## 2025-02-03 DIAGNOSIS — Z951 Presence of aortocoronary bypass graft: Secondary | ICD-10-CM

## 2025-02-03 LAB — GLUCOSE, CAPILLARY
Glucose-Capillary: 46 mg/dL — ABNORMAL LOW (ref 70–99)
Glucose-Capillary: 93 mg/dL (ref 70–99)

## 2025-02-03 NOTE — Progress Notes (Signed)
 Incomplete Session Note  Patient Details  Name: Frederick Miles MRN: [id number] Date of Birth: 24-Nov-1946 Referring Provider:   Flowsheet Row INTENSIVE CARDIAC REHAB ORIENT from 01/19/2025 in [facility]  Referring Provider Wonda Sharper, MD    Elna KATHEE Lyme did not complete his rehab session.  Patient's CBG 52 by his CGM. His meter had been going off for a while in the education class. Jivan said that he felt woozy. Patient was taken into the treatment room and given a tube of glucose gel. CBG 46 by hospital meter. Glucose gel repeated. Patient awake and orientated. Blood pressure 124/60. Oxygen saturation 100% on room air. Telemetry rhythm Sinus rate 61.Patient was a second tube of glucose gel. Patient reported feeling better. CBG 93 by hospital meter. Demontez said that he ate a bowl of cereal this morning and took 6 units of insulin . Onsite provider notified. Orren Fabry PAC came in and evaluated the patient. CBG 123 and 126 by his CGM. Patient's wife called and notified about today's event. I asked the patient to bring his medication bottles on his next scheduled appointment. Patient's diabetic provider Dr Onita  office called and notified. Left message with Dr Monia CMA. Patient was discharged from cardiac rehab to go home without further complaints or symptoms. Mr Bazinet plans to return to exercise on Monday.Hadassah Elpidio Quan RN BSN

## 2025-02-05 ENCOUNTER — Encounter: Payer: Self-pay | Admitting: Emergency Medicine

## 2025-02-05 ENCOUNTER — Ambulatory Visit: Admitting: Emergency Medicine

## 2025-02-05 VITALS — BP 118/64 | HR 51 | Ht 70.0 in | Wt 165.0 lb

## 2025-02-05 DIAGNOSIS — E785 Hyperlipidemia, unspecified: Secondary | ICD-10-CM

## 2025-02-05 DIAGNOSIS — I7143 Infrarenal abdominal aortic aneurysm, without rupture: Secondary | ICD-10-CM

## 2025-02-05 DIAGNOSIS — I480 Paroxysmal atrial fibrillation: Secondary | ICD-10-CM

## 2025-02-05 DIAGNOSIS — I2510 Atherosclerotic heart disease of native coronary artery without angina pectoris: Secondary | ICD-10-CM

## 2025-02-05 DIAGNOSIS — I10 Essential (primary) hypertension: Secondary | ICD-10-CM

## 2025-02-05 DIAGNOSIS — I712 Thoracic aortic aneurysm, without rupture, unspecified: Secondary | ICD-10-CM

## 2025-02-05 DIAGNOSIS — Z79899 Other long term (current) drug therapy: Secondary | ICD-10-CM

## 2025-02-05 NOTE — Patient Instructions (Addendum)
 Medication Instructions:  STOP TAKING DILTIAZEM  STOP TAKING HYDROCHLOROTHIAZIDE . STOP TAKING LISINOPRIL . STOP TAKING MIDODRINE .  Lab Work: BMET TO BE DONE TODAY.   Testing/Procedures: NONE   Follow-Up: At [facility], you and your health needs are our priority.  As part of our continuing mission to provide you with exceptional heart care, our providers are all part of one team.  This team includes your primary Cardiologist (physician) and Advanced Practice Providers or APPs (Physician Assistants and Nurse Practitioners) who all work together to provide you with the care you need, when you need it.  Your next appointment:   3 MONTHS  Provider:   Lum Louis, NP   Other Instructions:  PLEASE GIVE ME A CALL AFTER 1 PM SO THAT WE CAN VERIFY WHAT MEDICATIONS YOUR ARE TAKING.

## 2025-02-05 NOTE — Progress Notes (Signed)
 " Cardiology Office Note:    Date:  02/05/2025  ID:  Frederick Miles, DOB May 27, 1946, MRN [id number] PCP: Onita Rush, MD  [facility] Providers Cardiologist:  Ozell Fell, MD Cardiology APP:  Rana Lum CROME, NP       Patient Profile:       Chief Complaint: 53-month follow-up History of Present Illness:  Frederick Miles is a 79 y.o. male with visit-pertinent history of coronary artery disease s/p stent in 2007, paroxysmal atrial fibrillation, infrarenal abdominal aortic aneurysm followed by vascular surgery, hypertension, type 2 diabetes  He has history of CAD s/p DES to proximal LAD in 2007.  He had a low risk Lexiscan  performed 07/2015.  Patient was admitted with NSTEMI on 11/06/2024 with cardiac catheterization revealing severe 99% stenosis of proximal LAD at edge of stent.  He underwent coronary artery bypass grafting x 1 (LIMA to LAD) on 11/07/2024 with Dr. Kerrin.  He developed atrial fibrillation with RVR and was started on IV amiodarone  but quickly converted to NSR and was transition to p.o. amiodarone  due to hypotension and bradycardia.  He did remain bradycardic so Lopressor  was held and amiodarone  was decreased to 200 mg twice daily.  He was not started on Plavix for NSTEMI since he was on Eliquis  and ASA.  Most recent CTA chest aorta on 11/05/2024 showed stable ascending thoracic aortic aneurysm measuring 4.4 cm.  Last seen in clinic on 11/30/2024 by Rosaline, Frederick Miles.  He was doing well without exertional symptoms.  He was on midodrine  for low blood pressure.  Blood pressures at home have been 100 over 60s.  On 01/18/2025 patient called in office reporting normal blood pressures.  Patient was instructed to discontinue amiodarone  and midodrine  at that time.   Discussed the use of AI scribe software for clinical note transcription with the patient, who gave verbal consent to proceed.  History of Present Illness Frederick Miles is a 79 year old male with coronary artery  disease and atrial fibrillation who presents for follow-up.  Today he is doing well without acute cardiovascular concerns or complaints.  He had atrial fibrillation during open heart surgery. He was on amiodarone  and midodrine , which were stopped last month per his cardiologist. He currently has no palpitations, dizziness, tachycardia, or other AF symptoms.  He denies syncope or presyncope.  His post-CABG recovery has been good. He denies current chest pain, shortness of breath, or swelling.  He remains active without exertional symptoms.  He has an abdominal aortic aneurysm and follows with Dr. [name]. He reports no recent changes, low back pain, or abdominal pain.  He remains active at home with regular running and physical activities. He has not been consistent with cardiac rehabilitation due to weather and personal preference.  Review of systems:  Please see the history of present illness. All other systems are reviewed and otherwise negative.       Studies Reviewed:    EKG Interpretation Date/Time:  Friday February 05 2025 10:44:22 EST Ventricular Rate:  47 PR Interval:  192 QRS Duration:  74 QT Interval:  490 QTC Calculation: 433 R Axis:   -22  Text Interpretation: Sinus bradycardia Low voltage QRS When compared with ECG of 30-Nov-2024 13:47, Questionable change in QRS axis Nonspecific T wave abnormality no longer evident in Inferior leads Nonspecific T wave abnormality has replaced inverted T waves in Anterior leads Confirmed by Rana Lum [phone]) on 02/05/2025 5:12:24 PM    Cardiac catheterization 11/06/2024   Ost LAD to Prox  LAD lesion is 99% stenosed.   1.  High-grade edge ISR proximal LAD stent.  Given previous stent failure and proximity to left main, will obtain cardiothoracic surgical consultation. 2.  LVEDP of 28 mmHg.   Recommendation: Restart heparin  infusion 2 hours after TR band has been weaned.  Results reviewed with patient, patient's wife, Cardiology Team  A staff, and hospital medicine.  I reviewed the findings with Dr. Kerrin who will see the patient for consideration of surgical revascularization. Diagnostic Dominance: Right  Echocardiogram 11/06/2024 1. Left ventricular ejection fraction, by estimation, is 60 to 65%. The  left ventricle has normal function. The left ventricle has no regional  wall motion abnormalities. Left ventricular diastolic parameters were  normal.   2. Right ventricular systolic function is normal. The right ventricular  size is normal.   3. The mitral valve is normal in structure. No evidence of mitral valve  regurgitation. No evidence of mitral stenosis.   4. The aortic valve is tricuspid. Aortic valve regurgitation is not  visualized. Aortic valve sclerosis is present, with no evidence of aortic  valve stenosis.   5. The inferior vena cava is normal in size with greater than 50%  respiratory variability, suggesting right atrial pressure of 3 mmHg.   Echocardiogram 05/16/2023  1. Consider f/u gated chest CTA to further size the ascending aorta.   2. Left ventricular ejection fraction, by estimation, is 60 to 65%. The  left ventricle has normal function. The left ventricle has no regional  wall motion abnormalities. Left ventricular diastolic parameters were  normal.   3. Right ventricular systolic function is normal. The right ventricular  size is normal.   4. Left atrial size was moderately dilated.   5. The mitral valve is normal in structure. No evidence of mitral valve  regurgitation. No evidence of mitral stenosis.   6. The aortic valve is tricuspid. There is mild calcification of the  aortic valve. Aortic valve regurgitation is not visualized. Aortic valve  sclerosis is present, with no evidence of aortic valve stenosis.   7. Aortic dilatation noted. There is severe dilatation of the ascending  aorta, measuring 46 mm.   8. The inferior vena cava is normal in size with greater than 50%   respiratory variability, suggesting right atrial pressure of 3 mmHg.    Risk Assessment/Calculations:    CHA2DS2-VASc Score = 4   This indicates a 4.8% annual risk of stroke. The patient's score is based upon: CHF History: 0 HTN History: 1 Diabetes History: 0 Stroke History: 0 Vascular Disease History: 1 Age Score: 2 Gender Score: 0             Physical Exam:   VS:  BP 118/64 (BP Location: Right Arm, Patient Position: Sitting, Cuff Size: Normal)   Pulse (!) 51   Ht 5' 10 (1.778 m)   Wt 165 lb (74.8 kg)   BMI 23.68 kg/m    Wt Readings from Last 3 Encounters:  02/05/25 165 lb (74.8 kg)  01/19/25 163 lb 5.8 oz (74.1 kg)  12/07/24 158 lb (71.7 kg)    GEN: Well nourished, well developed in no acute distress NECK: No JVD; No carotid bruits CARDIAC: RRR, no murmurs, rubs, gallops RESPIRATORY:  Clear to auscultation without rales, wheezing or rhonchi  ABDOMEN: Soft, non-tender, non-distended EXTREMITIES:  No edema; No acute deformity      Assessment and Plan:  Coronary artery disease S/p DES to proximal LAD in 2007 S/p NSTEMI 10/2024 with LHC showing  high-grade edge ISR to proximal LAD stent S/p CABG x 1 (LIMA to LAD) on 11/07/2024 - EKG without acute ischemic features - Experienced orthostatic hypotension after his CABG which improved with midodrine  which has since been discontinued - Today patient is stable without chest pains.  Remains active without exertional symptoms.  No symptoms to suggest active angina.  No indication for ischemic evaluation at this time - Continue atorvastatin  40 mg daily - Continue aspirin  81 mg daily and Eliquis  5 mg twice daily - No beta-blocker due to baseline bradycardia  Paroxysmal atrial fibrillation Known history of PAF Did have an episode of RVR s/p CABG and was started on amiodarone  and has since been discontinued.  Beta-blocker discontinued at time of CABG due to bradycardia - Today EKG shows patient is maintaining sinus rhythm.   He is without symptoms concerning for recurrent PAF.  He denies tachypalpitations - No longer on diltiazem  or metoprolol  given bradycardia at time of CABG - Continue Eliquis  5 mg twice daily, no bleeding concerns - Heart rate today is 51 bpm.  He does remain asymptomatic.  Will monitor his heart rate closely  Hyperlipidemia LDL 42 in 10/2024 and well-controlled - Continue atorvastatin  40 mg daily  Hypertension History of hypotension Hydrochlorothiazide  and lisinopril  discontinued on 10/2024 in the setting of AKI.  Diltiazem  discontinued at time of CABG on 11/8 and he was subsequently started on midodrine  outpatient on 11/20 for hypotension with associated lightheadedness/dizziness - Now off of midodrine  and blood pressure today well-controlled at 118/64 - No changes to current regimen.  Not currently on antihypertensive  Thoracic aortic aneurysm CTA aorta 10/2024 showed stable ascending thoracic aortic aneurysm measuring 4.4 cm - Can repeat imaging x 1 year for routine monitoring - Maintaining adequate blood pressure control  Infrarenal abdominal aortic aneurysm - Stable on most recent exam 02/2024.  Notes reviewed with plans to repeat in 2-year intervals - Followed by Dr. [name] with vascular surgery      Dispo:  Return in about 3 months (around 05/05/2025).  Signed, Lum LITTIE Louis, NP  "

## 2025-02-08 ENCOUNTER — Encounter (HOSPITAL_COMMUNITY)

## 2025-02-10 ENCOUNTER — Encounter (HOSPITAL_COMMUNITY)

## 2025-02-12 ENCOUNTER — Encounter (HOSPITAL_COMMUNITY)

## 2025-02-15 ENCOUNTER — Encounter (HOSPITAL_COMMUNITY)

## 2025-02-17 ENCOUNTER — Encounter (HOSPITAL_COMMUNITY)

## 2025-02-19 ENCOUNTER — Encounter (HOSPITAL_COMMUNITY)

## 2025-02-22 ENCOUNTER — Encounter (HOSPITAL_COMMUNITY)

## 2025-02-24 ENCOUNTER — Encounter (HOSPITAL_COMMUNITY)

## 2025-02-26 ENCOUNTER — Encounter (HOSPITAL_COMMUNITY)

## 2025-03-01 ENCOUNTER — Encounter (HOSPITAL_COMMUNITY)

## 2025-03-03 ENCOUNTER — Encounter (HOSPITAL_COMMUNITY)

## 2025-03-05 ENCOUNTER — Encounter (HOSPITAL_COMMUNITY)

## 2025-03-08 ENCOUNTER — Encounter (HOSPITAL_COMMUNITY)

## 2025-03-10 ENCOUNTER — Encounter (HOSPITAL_COMMUNITY)

## 2025-03-12 ENCOUNTER — Encounter (HOSPITAL_COMMUNITY)

## 2025-03-15 ENCOUNTER — Encounter (HOSPITAL_COMMUNITY)

## 2025-03-17 ENCOUNTER — Encounter (HOSPITAL_COMMUNITY)

## 2025-03-19 ENCOUNTER — Encounter (HOSPITAL_COMMUNITY)

## 2025-03-22 ENCOUNTER — Encounter (HOSPITAL_COMMUNITY)

## 2025-03-24 ENCOUNTER — Encounter (HOSPITAL_COMMUNITY)

## 2025-03-26 ENCOUNTER — Encounter (HOSPITAL_COMMUNITY)

## 2025-03-29 ENCOUNTER — Encounter (HOSPITAL_COMMUNITY)

## 2025-03-31 ENCOUNTER — Encounter (HOSPITAL_COMMUNITY)

## 2025-04-02 ENCOUNTER — Encounter (HOSPITAL_COMMUNITY)

## 2025-04-05 ENCOUNTER — Encounter (HOSPITAL_COMMUNITY)

## 2025-04-07 ENCOUNTER — Encounter (HOSPITAL_COMMUNITY)

## 2025-04-09 ENCOUNTER — Encounter (HOSPITAL_COMMUNITY)

## 2025-04-12 ENCOUNTER — Encounter (HOSPITAL_COMMUNITY)

## 2025-04-14 ENCOUNTER — Encounter (HOSPITAL_COMMUNITY)

## 2025-05-07 ENCOUNTER — Ambulatory Visit: Admitting: Emergency Medicine
